# Patient Record
Sex: Male | Born: 1960 | Race: White | Hispanic: No | Marital: Married | Smoking: Current every day smoker
Health system: Southern US, Community
[De-identification: ages and names within clinical notes are randomized; demographics above are authoritative.]

## PROBLEM LIST (undated history)

## (undated) ENCOUNTER — Emergency Department (HOSPITAL_COMMUNITY): Admission: EM | Payer: Commercial Managed Care - PPO | Source: Home / Self Care

## (undated) DIAGNOSIS — M1990 Unspecified osteoarthritis, unspecified site: Secondary | ICD-10-CM

## (undated) DIAGNOSIS — E785 Hyperlipidemia, unspecified: Secondary | ICD-10-CM

## (undated) DIAGNOSIS — I2510 Atherosclerotic heart disease of native coronary artery without angina pectoris: Secondary | ICD-10-CM

## (undated) DIAGNOSIS — I779 Disorder of arteries and arterioles, unspecified: Secondary | ICD-10-CM

## (undated) DIAGNOSIS — Z8673 Personal history of transient ischemic attack (TIA), and cerebral infarction without residual deficits: Secondary | ICD-10-CM

## (undated) DIAGNOSIS — E079 Disorder of thyroid, unspecified: Secondary | ICD-10-CM

## (undated) DIAGNOSIS — I10 Essential (primary) hypertension: Secondary | ICD-10-CM

## (undated) HISTORY — DX: Disorder of thyroid, unspecified: E07.9

## (undated) HISTORY — DX: Hyperlipidemia, unspecified: E78.5

## (undated) HISTORY — DX: Unspecified osteoarthritis, unspecified site: M19.90

## (undated) HISTORY — PX: BACK SURGERY: SHX140

## (undated) HISTORY — DX: Disorder of arteries and arterioles, unspecified: I77.9

## (undated) HISTORY — DX: Essential (primary) hypertension: I10

---

## 2002-01-23 ENCOUNTER — Ambulatory Visit (HOSPITAL_COMMUNITY): Admission: RE | Admit: 2002-01-23 | Discharge: 2002-01-23 | Payer: Self-pay | Admitting: Neurosurgery

## 2002-01-23 ENCOUNTER — Encounter: Payer: Self-pay | Admitting: Neurosurgery

## 2005-08-31 ENCOUNTER — Ambulatory Visit: Payer: Self-pay | Admitting: Gastroenterology

## 2005-09-02 ENCOUNTER — Ambulatory Visit: Payer: Self-pay | Admitting: Gastroenterology

## 2005-09-27 ENCOUNTER — Other Ambulatory Visit: Payer: Self-pay

## 2005-09-28 ENCOUNTER — Ambulatory Visit: Payer: Self-pay | Admitting: Gastroenterology

## 2005-11-10 ENCOUNTER — Ambulatory Visit: Payer: Self-pay | Admitting: Gastroenterology

## 2007-12-15 IMAGING — RF DG BARIUM SWALLOW
1 series · 14 of 14 positions shown · non-contrast
Comparison: none

REASON FOR EXAM: Dysphagia
COMMENTS:

[Series 1: run · 8 acquisitions, 14 frames shown]
[im 1/8]
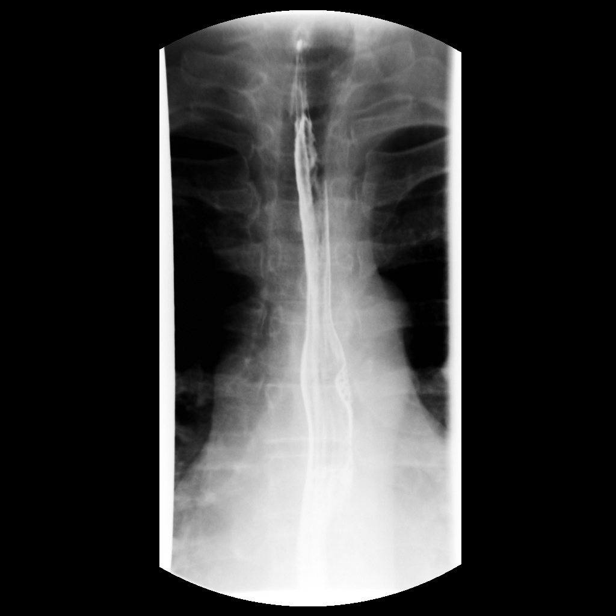
[im 2/8]
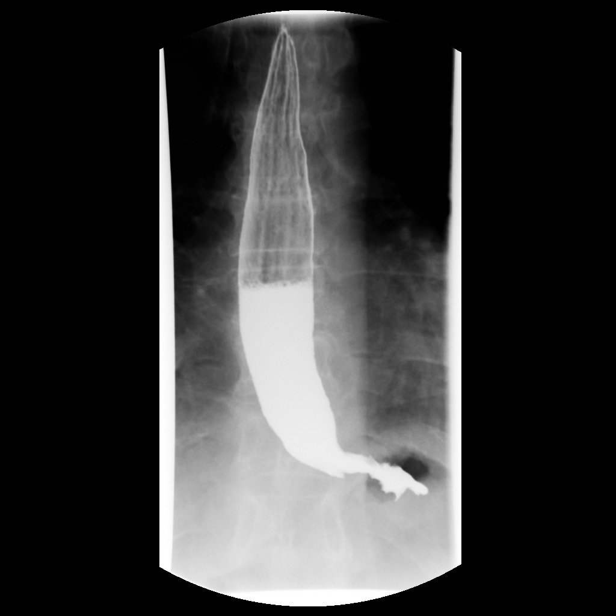
[im 3/8]
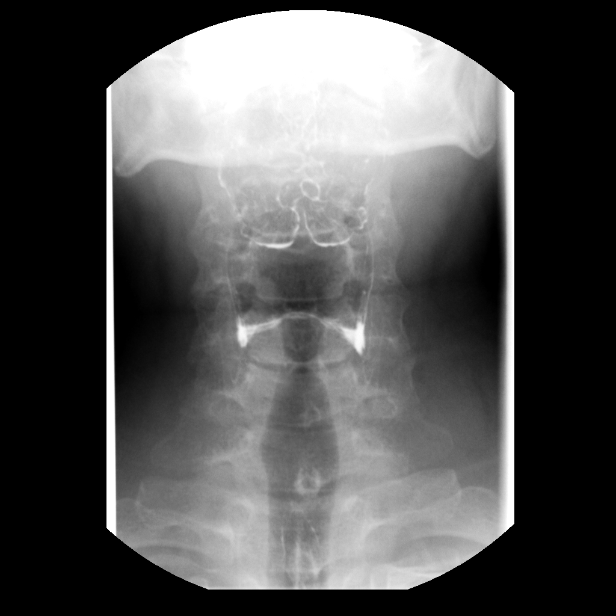
[im 3/8]
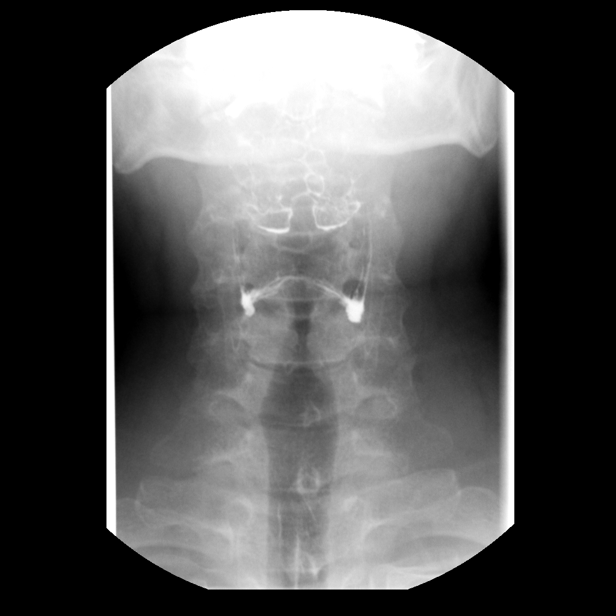
[im 3/8]
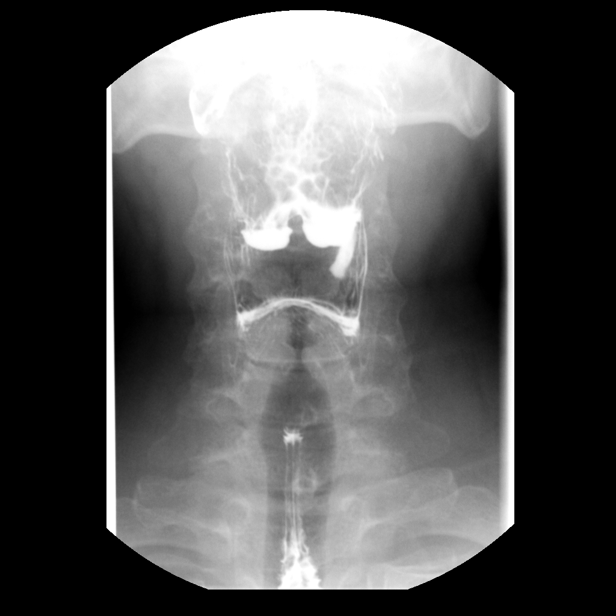
[im 3/8]
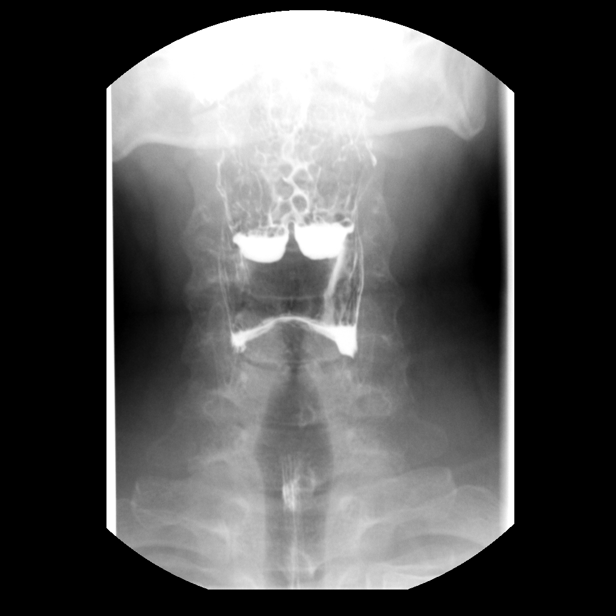
[im 4/8]
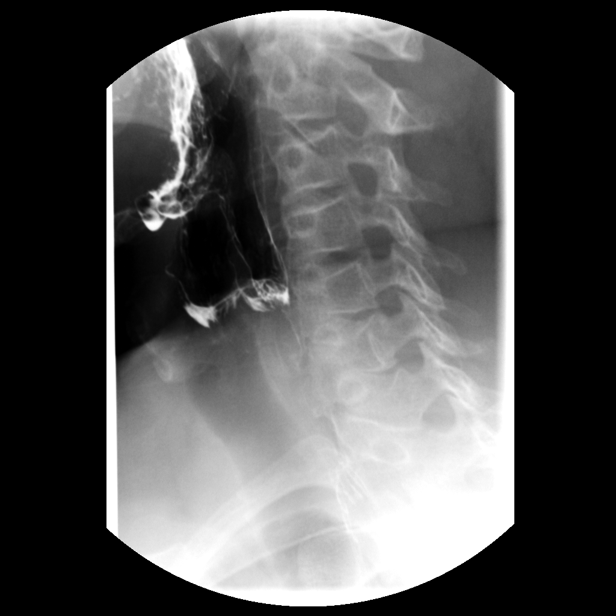
[im 4/8]
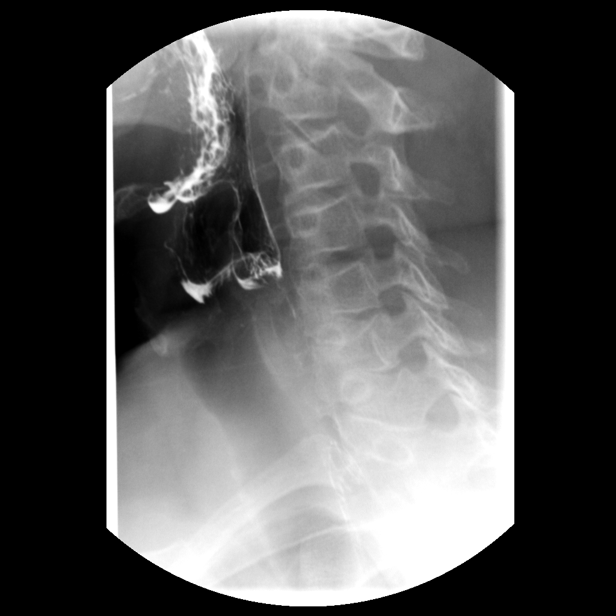
[im 4/8]
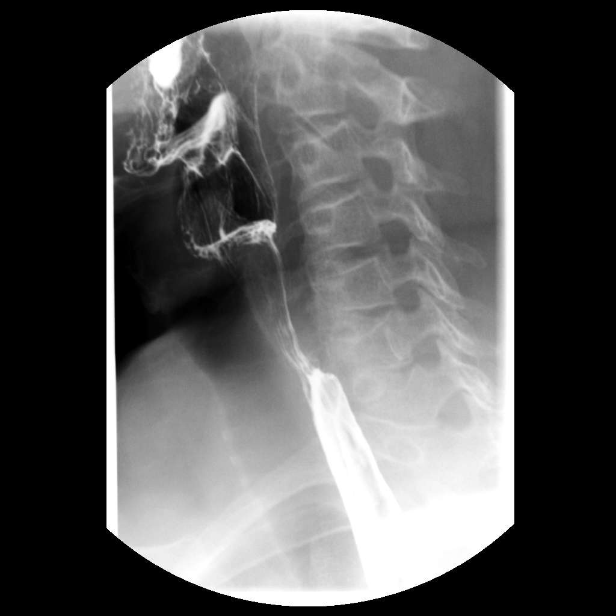
[im 4/8]
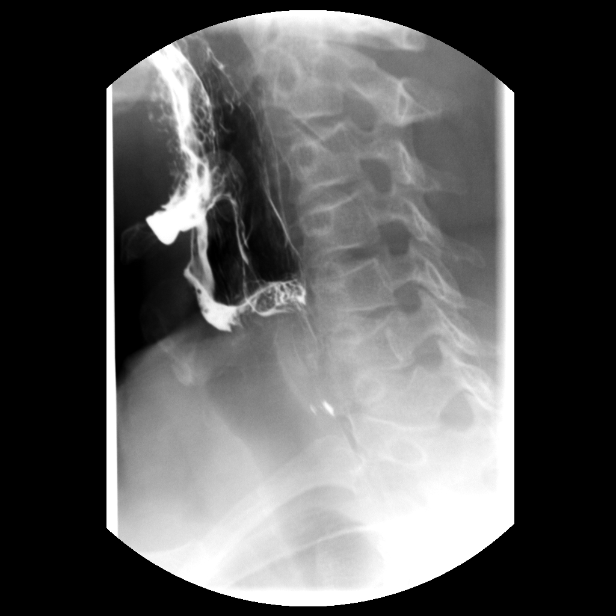
[im 5/8]
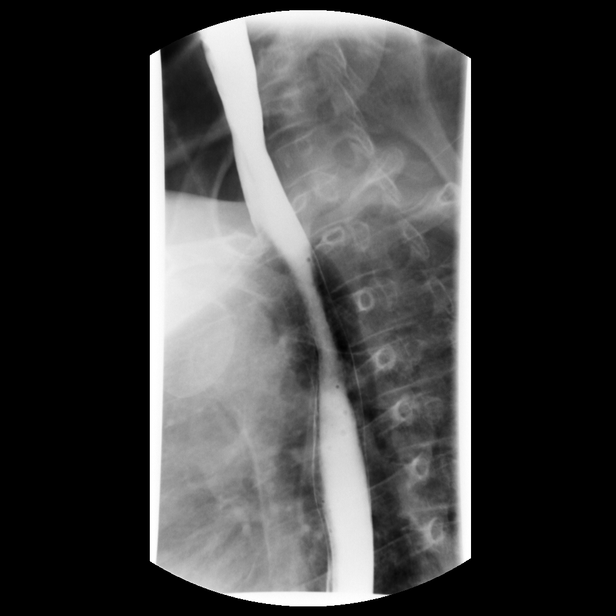
[im 6/8]
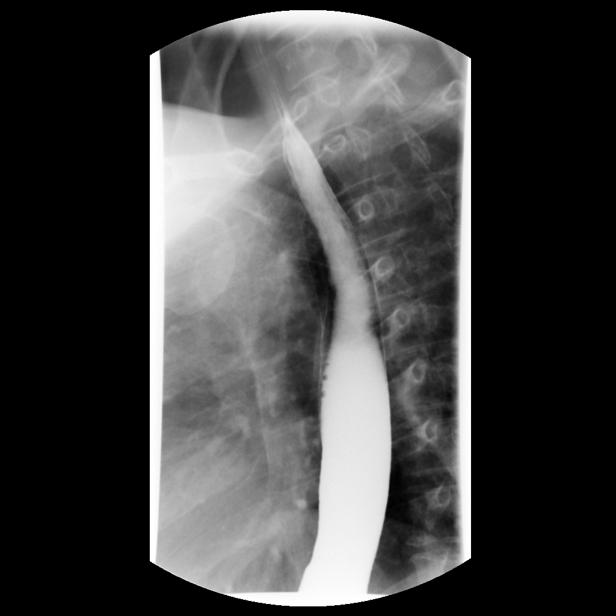
[im 7/8]
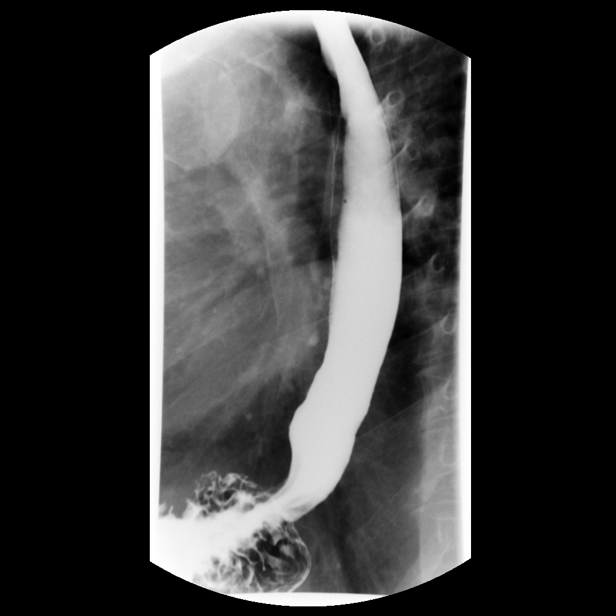
[im 8/8]
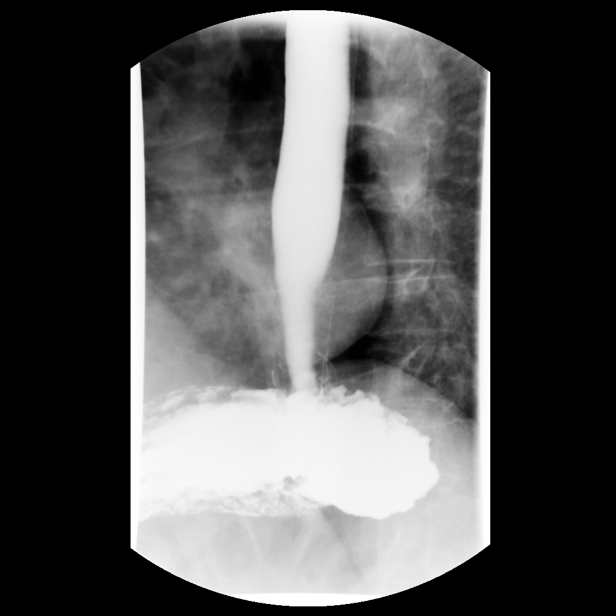

[14 of 14 positions shown; findings below may reference images not displayed]

PROCEDURE:     FL  - FL BARIUM SWALLOW  - August 31, 2005  [DATE]

RESULT:        The cervical esophagus was first examined. Barium passed
through the cervical esophagus unobstructed.  No areas of narrowing are
identified.  The remainder of the esophagus appears within normal limits
with normal peristaltic activity. No hiatal hernia is seen.  A moderate
amount of gastroesophageal reflux was noted post water siphon exam.  0.5 mm
tablet passed through the esophagus into the stomach unobstructed.
IMPRESSION: 1.     No hiatal hernia.
2.     Moderate amount of gastroesophageal reflux noted.

## 2012-12-27 ENCOUNTER — Ambulatory Visit: Payer: Self-pay | Admitting: Gastroenterology

## 2012-12-28 ENCOUNTER — Ambulatory Visit: Payer: Self-pay | Admitting: Gastroenterology

## 2013-01-02 ENCOUNTER — Ambulatory Visit: Payer: Self-pay | Admitting: Gastroenterology

## 2013-01-04 LAB — PATHOLOGY REPORT

## 2015-09-15 ENCOUNTER — Other Ambulatory Visit: Payer: Self-pay | Admitting: Emergency Medicine

## 2015-09-15 DIAGNOSIS — R634 Abnormal weight loss: Secondary | ICD-10-CM

## 2015-09-24 ENCOUNTER — Ambulatory Visit: Admission: RE | Admit: 2015-09-24 | Payer: BLUE CROSS/BLUE SHIELD | Source: Ambulatory Visit

## 2015-11-20 DIAGNOSIS — I10 Essential (primary) hypertension: Secondary | ICD-10-CM | POA: Insufficient documentation

## 2015-11-20 DIAGNOSIS — I6521 Occlusion and stenosis of right carotid artery: Secondary | ICD-10-CM | POA: Insufficient documentation

## 2015-11-24 ENCOUNTER — Other Ambulatory Visit: Payer: Self-pay | Admitting: Vascular Surgery

## 2015-11-24 DIAGNOSIS — I6523 Occlusion and stenosis of bilateral carotid arteries: Secondary | ICD-10-CM

## 2015-11-28 ENCOUNTER — Ambulatory Visit
Admission: RE | Admit: 2015-11-28 | Discharge: 2015-11-28 | Disposition: A | Discharge status: Home / Self Care | Payer: BLUE CROSS/BLUE SHIELD | Source: Ambulatory Visit | Attending: Emergency Medicine | Admitting: Emergency Medicine

## 2015-11-28 DIAGNOSIS — I700 Atherosclerosis of aorta: Secondary | ICD-10-CM | POA: Insufficient documentation

## 2015-11-28 DIAGNOSIS — I6523 Occlusion and stenosis of bilateral carotid arteries: Secondary | ICD-10-CM | POA: Diagnosis present

## 2015-11-28 DIAGNOSIS — R634 Abnormal weight loss: Secondary | ICD-10-CM | POA: Insufficient documentation

## 2015-11-28 DIAGNOSIS — I2510 Atherosclerotic heart disease of native coronary artery without angina pectoris: Secondary | ICD-10-CM | POA: Diagnosis not present

## 2015-11-28 MED ORDER — IOPAMIDOL (ISOVUE-370) INJECTION 76%
75.0000 mL | Freq: Once | INTRAVENOUS | Status: AC | PRN
  Administered 2015-11-28: 75 mL via INTRAVENOUS

## 2016-02-06 DIAGNOSIS — E782 Mixed hyperlipidemia: Secondary | ICD-10-CM | POA: Insufficient documentation

## 2016-05-28 DIAGNOSIS — I70219 Atherosclerosis of native arteries of extremities with intermittent claudication, unspecified extremity: Secondary | ICD-10-CM | POA: Insufficient documentation

## 2016-06-02 ENCOUNTER — Encounter (INDEPENDENT_AMBULATORY_CARE_PROVIDER_SITE_OTHER): Payer: BLUE CROSS/BLUE SHIELD

## 2016-06-02 ENCOUNTER — Ambulatory Visit (INDEPENDENT_AMBULATORY_CARE_PROVIDER_SITE_OTHER): Payer: Self-pay | Admitting: Vascular Surgery

## 2017-06-02 ENCOUNTER — Other Ambulatory Visit: Payer: Self-pay | Admitting: Student

## 2017-06-02 DIAGNOSIS — M5441 Lumbago with sciatica, right side: Principal | ICD-10-CM

## 2017-06-02 DIAGNOSIS — M5442 Lumbago with sciatica, left side: Principal | ICD-10-CM

## 2017-06-02 DIAGNOSIS — M4726 Other spondylosis with radiculopathy, lumbar region: Secondary | ICD-10-CM

## 2017-06-02 DIAGNOSIS — G8929 Other chronic pain: Secondary | ICD-10-CM

## 2017-06-08 ENCOUNTER — Ambulatory Visit
Admission: RE | Admit: 2017-06-08 | Discharge: 2017-06-08 | Disposition: A | Discharge status: Home / Self Care | Payer: BLUE CROSS/BLUE SHIELD | Source: Ambulatory Visit | Attending: Student | Admitting: Student

## 2017-06-08 DIAGNOSIS — M5442 Lumbago with sciatica, left side: Secondary | ICD-10-CM | POA: Diagnosis not present

## 2017-06-08 DIAGNOSIS — M5441 Lumbago with sciatica, right side: Secondary | ICD-10-CM | POA: Insufficient documentation

## 2017-06-08 DIAGNOSIS — M5126 Other intervertebral disc displacement, lumbar region: Secondary | ICD-10-CM | POA: Diagnosis not present

## 2017-06-08 DIAGNOSIS — M4726 Other spondylosis with radiculopathy, lumbar region: Secondary | ICD-10-CM | POA: Insufficient documentation

## 2017-06-08 DIAGNOSIS — G8929 Other chronic pain: Secondary | ICD-10-CM | POA: Insufficient documentation

## 2017-06-08 DIAGNOSIS — M48061 Spinal stenosis, lumbar region without neurogenic claudication: Secondary | ICD-10-CM | POA: Insufficient documentation

## 2017-06-08 DIAGNOSIS — M2578 Osteophyte, vertebrae: Secondary | ICD-10-CM | POA: Insufficient documentation

## 2020-07-31 ENCOUNTER — Other Ambulatory Visit: Payer: Self-pay | Admitting: Ophthalmology

## 2020-07-31 DIAGNOSIS — G453 Amaurosis fugax: Secondary | ICD-10-CM

## 2020-08-20 ENCOUNTER — Other Ambulatory Visit: Payer: Self-pay

## 2020-08-20 ENCOUNTER — Ambulatory Visit
Admission: RE | Admit: 2020-08-20 | Discharge: 2020-08-20 | Disposition: A | Discharge status: Home / Self Care | Payer: Commercial Managed Care - PPO | Source: Ambulatory Visit | Attending: Ophthalmology | Admitting: Ophthalmology

## 2020-08-20 DIAGNOSIS — G453 Amaurosis fugax: Secondary | ICD-10-CM | POA: Insufficient documentation

## 2020-09-01 ENCOUNTER — Encounter (INDEPENDENT_AMBULATORY_CARE_PROVIDER_SITE_OTHER): Payer: Self-pay | Admitting: Vascular Surgery

## 2020-09-01 ENCOUNTER — Other Ambulatory Visit: Payer: Self-pay

## 2020-09-01 ENCOUNTER — Ambulatory Visit (INDEPENDENT_AMBULATORY_CARE_PROVIDER_SITE_OTHER): Payer: Commercial Managed Care - PPO | Admitting: Vascular Surgery

## 2020-09-01 VITALS — BP 168/71 | HR 91 | Resp 16 | Ht 66.0 in | Wt 181.8 lb

## 2020-09-01 DIAGNOSIS — E782 Mixed hyperlipidemia: Secondary | ICD-10-CM | POA: Diagnosis not present

## 2020-09-01 DIAGNOSIS — I70219 Atherosclerosis of native arteries of extremities with intermittent claudication, unspecified extremity: Secondary | ICD-10-CM

## 2020-09-01 DIAGNOSIS — I10 Essential (primary) hypertension: Secondary | ICD-10-CM

## 2020-09-01 DIAGNOSIS — I6521 Occlusion and stenosis of right carotid artery: Secondary | ICD-10-CM | POA: Diagnosis not present

## 2020-09-01 NOTE — Progress Notes (Signed)
MRN : [id number]  [name] is a 60 y.o. (11-18-1960) male who presents with chief complaint of  Chief Complaint  Patient presents with  . New Patient (Initial Visit)    Ref Porfilio right carotid bifurcation contributes to 70-99% stenosis  .  History of Present Illness:   The patient is seen for evaluation of carotid stenosis. The carotid stenosis was identified after he experienced multiple episodes of right eye visual disturbances.  There is no recent history of TIA symptoms or focal motor deficits. There is no prior documented CVA.  There is no history of migraine headaches. There is no history of seizures.  The patient is taking enteric-coated aspirin 81 mg daily.  The patient has a history of coronary artery disease, no recent episodes of angina or shortness of breath. The patient has PAD noting his claudication symptoms are stable. There is a history of hyperlipidemia which is being treated with a statin.   Carotid duplex shows 80-99% RICA and <40% LICA  Current Meds  Medication Sig  . acetaminophen (TYLENOL) 500 MG tablet Take by mouth.  Marland Kitchen aspirin 81 MG EC tablet Take by mouth.  Marland Kitchen atorvastatin (LIPITOR) 40 MG tablet Take 1 tablet by mouth daily.  . famotidine (PEPCID) 20 MG tablet   . hydrochlorothiazide (HYDRODIURIL) 25 MG tablet   . levothyroxine (SYNTHROID) 150 MCG tablet   . lisinopril (ZESTRIL) 10 MG tablet     Past Medical History:  Diagnosis Date  . Arthritis   . Hyperlipidemia   . Hypertension   . Thyroid disease    hypothyrodism      Social History Social History   Tobacco Use  . Smoking status: Current Every Day Smoker  . Smokeless tobacco: Never Used  Substance Use Topics  . Alcohol use: Never  . Drug use: Never    Family History Family History  Problem Relation Age of Onset  . Hypothyroidism Mother   . Diabetes Mother   . Heart attack Mother   . Cancer Father   . Hypothyroidism Sister   . Hypothyroidism Brother   No  family history of bleeding/clotting disorders, porphyria or autoimmune disease   No Known Allergies   REVIEW OF SYSTEMS (Negative unless checked)  Constitutional: [] Weight loss  [] Fever  [] Chills Cardiac: [] Chest pain   [] Chest pressure   [] Palpitations   [] Shortness of breath when laying flat   [] Shortness of breath with exertion. Vascular:  [x] Pain in legs with walking   [] Pain in legs at rest  [] History of DVT   [] Phlebitis   [] Swelling in legs   [] Varicose veins   [] Non-healing ulcers Pulmonary:   [] Uses home oxygen   [] Productive cough   [] Hemoptysis   [] Wheeze  [] COPD   [] Asthma Neurologic:  [] Dizziness   [] Seizures   [] History of stroke   [] History of TIA  [] Aphasia   [x] Vissual changes   [] Weakness or numbness in arm   [] Weakness or numbness in leg Musculoskeletal:   [] Joint swelling   [x] Joint pain   [x] Low back pain Hematologic:  [] Easy bruising  [] Easy bleeding   [] Hypercoagulable state   [] Anemic Gastrointestinal:  [] Diarrhea   [] Vomiting  [] Gastroesophageal reflux/heartburn   [] Difficulty swallowing. Genitourinary:  [] Chronic kidney disease   [] Difficult urination  [] Frequent urination   [] Blood in urine Skin:  [] Rashes   [] Ulcers  Psychological:  [] History of anxiety   []  History of major depression.  Physical Examination  Vitals:   09/01/20 1028  BP: (!) 168/71  Pulse: 91  Resp: 16  Weight: 181 lb 12.8 oz (82.5 kg)  Height: 5\' 6"  (1.676 m)   Body mass index is 29.34 kg/m. Gen: WD/WN, NAD Head: [place]/AT, No temporalis wasting.  Ear/Nose/Throat: Hearing grossly intact, nares w/o erythema or drainage, poor dentition Eyes: PER, EOMI, sclera nonicteric.  Neck: Supple, no masses.  No bruit or JVD.  Pulmonary:  Good air movement, clear to auscultation bilaterally, no use of accessory muscles.  Cardiac: RRR, normal S1, S2, no Murmurs. Vascular: bilateral carotid bruits noted Vessel Right Left  Radial Palpable Palpable  Brachial Palpable Palpable  Carotid Palpable  Palpable  Gastrointestinal: soft, non-distended. No guarding/no peritoneal signs.  Musculoskeletal: M/S 5/5 throughout.  No deformity or atrophy.  Neurologic: CN 2-12 intact. Pain and light touch intact in extremities.  Symmetrical.  Speech is fluent. Motor exam as listed above. Psychiatric: Judgment intact, Mood & affect appropriate for pt's clinical situation. Dermatologic: No rashes or ulcers noted.  No changes consistent with cellulitis.   CBC No results found for: WBC, HGB, HCT, MCV, PLT  BMET No results found for: NA, K, CL, CO2, GLUCOSE, BUN, CREATININE, CALCIUM, GFRNONAA, GFRAA CrCl cannot be calculated (No successful lab value found.).  COAG No results found for: INR, PROTIME  Radiology Carotid Bilateral  Result Date: 08/20/2020 CLINICAL DATA:  60 year old male with a history of amaurosis fugax EXAM: BILATERAL CAROTID DUPLEX ULTRASOUND TECHNIQUE: 46 scale imaging, color Doppler and duplex ultrasound were performed of bilateral carotid and vertebral arteries in the neck. COMPARISON:  None. FINDINGS: Criteria: Quantification of carotid stenosis is based on velocity parameters that correlate the residual internal carotid diameter with NASCET-based stenosis levels, using the diameter of the distal internal carotid lumen as the denominator for stenosis measurement. The following velocity measurements were obtained: RIGHT ICA:  Systolic 484 cm/sec, Diastolic 105 cm/sec CCA:  33 cm/sec SYSTOLIC ICA/CCA RATIO:  14.5 ECA:  26 cm/sec LEFT ICA:  Systolic 142 cm/sec, Diastolic 54 cm/sec CCA:  107 cm/sec SYSTOLIC ICA/CCA RATIO:  1.3 ECA:  131 cm/sec Right Brachial SBP: Not acquired Left Brachial SBP: Not acquired RIGHT CAROTID ARTERY: No significant calcifications of the right common carotid artery. Intermediate waveform maintained. Moderate heterogeneous and partially calcified plaque at the right carotid bifurcation. No significant lumen shadowing. Low resistance waveform of the right ICA. No  significant tortuosity. RIGHT VERTEBRAL ARTERY: Antegrade flow with low resistance waveform. LEFT CAROTID ARTERY: No significant calcifications of the left common carotid artery. Intermediate waveform maintained. Moderate heterogeneous and partially calcified plaque at the left carotid bifurcation. No significant lumen shadowing. Low resistance waveform of the left ICA. No significant tortuosity. LEFT VERTEBRAL ARTERY:  Antegrade flow with low resistance waveform. IMPRESSION: Right: Heterogeneous and partially calcified plaque at the right carotid bifurcation contributes to 70%-99% stenosis by established duplex criteria. Left: Color duplex indicates moderate heterogeneous and calcified plaque, with no hemodynamically significant stenosis by duplex criteria in the extracranial cerebrovascular circulation. Signed, Wallace Cullens. Yvone Neu, RPVI [facility] Radiology Electronically Signed   By: [facility] D.O.   On: 08/20/2020 16:21     Assessment/Plan 1. Symptomatic carotid artery stenosis without infarction, right The patient remains asymptomatic with respect to the carotid stenosis.  However, the patient has now progressed and has a lesion the is >70%.  Patient should undergo CT angiography of the carotid arteries to define the degree of stenosis of the internal carotid arteries bilaterally and the anatomic suitability for surgery vs. intervention.  If the patient does indeed need  surgery cardiac clearance will be required, once cleared the patient will be scheduled for surgery.  The risks, benefits and alternative therapies were reviewed in detail with the patient.  All questions were answered.  The patient agrees to proceed with imaging.  Continue antiplatelet therapy as prescribed. Continue management of CAD, HTN and Hyperlipidemia. Healthy heart diet, encouraged exercise at least 4 times per week.   - CT ANGIO NECK W OR WO CONTRAST; Future  2.  Atherosclerotic peripheral vascular disease with intermittent claudication (HCC)  Recommend:  The patient has evidence of atherosclerosis of the lower extremities with claudication.  The patient does not voice lifestyle limiting changes at this point in time.  No invasive studies, angiography or surgery at this time The patient should continue walking and begin a more formal exercise program.  The patient should continue antiplatelet therapy and aggressive treatment of the lipid abnormalities  No changes in the patient's medications at this time  The patient should continue wearing graduated compression socks 10-15 mmHg strength to control the mild edema.    3. Benign essential HTN Continue antihypertensive medications as already ordered, these medications have been reviewed and there are no changes at this time.   4. Hyperlipidemia, mixed Continue statin as ordered and reviewed, no changes at this time   [name], MD  09/01/2020 11:04 AM

## 2020-09-04 ENCOUNTER — Telehealth (INDEPENDENT_AMBULATORY_CARE_PROVIDER_SITE_OTHER): Payer: Self-pay

## 2020-09-04 NOTE — Telephone Encounter (Signed)
[name] called and left a VM on the nurses line about scheduling a CT per DR. Schnier. I looked in the pt's char the order is there so I called Gevork Ayyad and gave her the number to radiology scheduling to schedule the pt's CT scan.

## 2020-09-17 ENCOUNTER — Ambulatory Visit
Admission: RE | Admit: 2020-09-17 | Discharge: 2020-09-17 | Disposition: A | Discharge status: Home / Self Care | Payer: Commercial Managed Care - PPO | Source: Ambulatory Visit | Attending: Vascular Surgery | Admitting: Vascular Surgery

## 2020-09-17 ENCOUNTER — Other Ambulatory Visit: Payer: Self-pay

## 2020-09-17 DIAGNOSIS — I6521 Occlusion and stenosis of right carotid artery: Secondary | ICD-10-CM

## 2020-09-17 LAB — POCT I-STAT CREATININE: Creatinine, Ser: 1.6 mg/dL — ABNORMAL HIGH (ref 0.61–1.24)

## 2020-09-17 MED ORDER — IOHEXOL 350 MG/ML SOLN
75.0000 mL | Freq: Once | INTRAVENOUS | Status: AC | PRN
  Administered 2020-09-17: 75 mL via INTRAVENOUS

## 2020-09-18 ENCOUNTER — Ambulatory Visit (INDEPENDENT_AMBULATORY_CARE_PROVIDER_SITE_OTHER): Payer: Commercial Managed Care - PPO | Admitting: Vascular Surgery

## 2020-09-18 VITALS — BP 131/70 | HR 77 | Ht 66.0 in | Wt 180.0 lb

## 2020-09-18 DIAGNOSIS — E782 Mixed hyperlipidemia: Secondary | ICD-10-CM

## 2020-09-18 DIAGNOSIS — I10 Essential (primary) hypertension: Secondary | ICD-10-CM | POA: Diagnosis not present

## 2020-09-18 DIAGNOSIS — I70219 Atherosclerosis of native arteries of extremities with intermittent claudication, unspecified extremity: Secondary | ICD-10-CM | POA: Diagnosis not present

## 2020-09-18 DIAGNOSIS — I6521 Occlusion and stenosis of right carotid artery: Secondary | ICD-10-CM | POA: Diagnosis not present

## 2020-09-19 ENCOUNTER — Telehealth (INDEPENDENT_AMBULATORY_CARE_PROVIDER_SITE_OTHER): Payer: Self-pay

## 2020-09-19 NOTE — Telephone Encounter (Signed)
Spoke with the patient and he is scheduled with Dr. Gilda Crease for a right carotid stent placement on 10/22/20. Covid testing on 10/20/20 between 8-12 pm at the [facility]. Pre-procedure instructions were discussed and will be mailed.

## 2020-09-21 ENCOUNTER — Encounter (INDEPENDENT_AMBULATORY_CARE_PROVIDER_SITE_OTHER): Payer: Self-pay | Admitting: Vascular Surgery

## 2020-09-21 NOTE — Progress Notes (Signed)
MRN : [id number]  [name] is a 60 y.o. ([birth year]) male who presents with chief complaint of  Chief Complaint  Patient presents with   Follow-up    CT results  .  History of Present Illness: The patient is seen for follow up evaluation of carotid stenosis status post CT angiogram. CT scan was done 09/18/2020. Patient reports that the test went well with no problems or complications.   The patient denies interval amaurosis fugax. There is no recent or interval TIA symptoms or focal motor deficits. There is no prior documented CVA.  The patient is taking enteric-coated aspirin 81 mg daily.  There is no history of migraine headaches. There is no history of seizures.  The patient has a history of coronary artery disease, no recent episodes of angina or shortness of breath. The patient denies PAD or claudication symptoms. There is a history of hyperlipidemia which is being treated with a statin.    CT angiogram is reviewed by me personally and shows 90 stenosis consistent with calcified plaque at the origin of the right internal carotid artery.    Current Meds  Medication Sig   acetaminophen (TYLENOL) 500 MG tablet Take by mouth.   aspirin 81 MG EC tablet Take by mouth.   atorvastatin (LIPITOR) 40 MG tablet Take 1 tablet by mouth daily.   famotidine (PEPCID) 20 MG tablet    hydrochlorothiazide (HYDRODIURIL) 25 MG tablet    levothyroxine (SYNTHROID) 150 MCG tablet    lisinopril (ZESTRIL) 10 MG tablet     Past Medical History:  Diagnosis Date   Arthritis    Hyperlipidemia    Hypertension    Thyroid disease    hypothyrodism    Past Surgical History:  Procedure Laterality Date   BACK SURGERY      Social History Social History   Tobacco Use   Smoking status: Every Day    Pack years: 0.00   Smokeless tobacco: Never  Substance Use Topics   Alcohol use: Never   Drug use: Never    Family History Family History  Problem Relation Age of Onset    Hypothyroidism Mother    Diabetes Mother    Heart attack Mother    Cancer Father    Hypothyroidism Sister    Hypothyroidism Brother     No Known Allergies   REVIEW OF SYSTEMS (Negative unless checked)  Constitutional: [] Weight loss  [] Fever  [] Chills Cardiac: [] Chest pain   [] Chest pressure   [] Palpitations   [] Shortness of breath when laying flat   [] Shortness of breath with exertion. Vascular:  [] Pain in legs with walking   [] Pain in legs at rest  [] History of DVT   [] Phlebitis   [] Swelling in legs   [] Varicose veins   [] Non-healing ulcers Pulmonary:   [] Uses home oxygen   [] Productive cough   [] Hemoptysis   [] Wheeze  [] COPD   [] Asthma Neurologic:  [] Dizziness   [] Seizures   [] History of stroke   [x] History of TIA  [] Aphasia   [x] Vissual changes   [] Weakness or numbness in arm   [] Weakness or numbness in leg Musculoskeletal:   [] Joint swelling   [] Joint pain   [] Low back pain Hematologic:  [] Easy bruising  [] Easy bleeding   [] Hypercoagulable state   [] Anemic Gastrointestinal:  [] Diarrhea   [] Vomiting  [] Gastroesophageal reflux/heartburn   [] Difficulty swallowing. Genitourinary:  [] Chronic kidney disease   [] Difficult urination  [] Frequent urination   [] Blood in urine Skin:  [] Rashes   [] Ulcers  Psychological:  []   History of anxiety   []  History of major depression.  Physical Examination  Vitals:   09/18/20 1544  BP: 131/70  Pulse: 77  Weight: 180 lb (81.6 kg)  Height: 5\' 6"  (1.676 m)   Body mass index is 29.05 kg/m. Gen: WD/WN, NAD Head: [place]/AT, No temporalis wasting.  Ear/Nose/Throat: Hearing grossly intact, nares w/o erythema or drainage Eyes: PER, EOMI, sclera nonicteric.  Neck: Supple, no large masses.   Pulmonary:  Good air movement, no audible wheezing bilaterally, no use of accessory muscles.  Cardiac: RRR, no JVD Vascular:   Vessel Right Left  Radial Palpable Palpable  Brachial Palpable Palpable  Carotid Palpable Palpable  Gastrointestinal: Non-distended. No  guarding/no peritoneal signs.  Musculoskeletal: M/S 5/5 throughout.  No deformity or atrophy.  Neurologic: CN 2-12 intact. Symmetrical.  Speech is fluent. Motor exam as listed above. Psychiatric: Judgment intact, Mood & affect appropriate for pt's clinical situation. Dermatologic: No rashes or ulcers noted.  No changes consistent with cellulitis.   CBC No results found for: WBC, HGB, HCT, MCV, PLT  BMET    Component Value Date/Time   CREATININE 1.60 (H) 09/17/2020 1306   Estimated Creatinine Clearance: 49.9 mL/min (A) (by C-G formula based on SCr of 1.6 mg/dL (H)).  COAG No results found for: INR, PROTIME  Radiology CT ANGIO NECK W OR WO CONTRAST  Result Date: 09/18/2020 CLINICAL DATA:  Carotid artery stenosis. EXAM: CT ANGIOGRAPHY NECK TECHNIQUE: Multidetector CT imaging of the neck was performed using the standard protocol during bolus administration of intravenous contrast. Multiplanar CT image reconstructions and MIPs were obtained to evaluate the vascular anatomy. Carotid stenosis measurements (when applicable) are obtained utilizing NASCET criteria, using the distal internal carotid diameter as the denominator. CONTRAST:  84mL OMNIPAQUE IOHEXOL 350 MG/ML SOLN COMPARISON:  CTA November 28, 2015. Ultrasound the carotids Aug 20, 2020. FINDINGS: Aortic arch: Calcific and noncalcific atherosclerosis of the aorta and its branch vessels. Approximately 70-80% narrowing of the left common carotid artery origin. Approximately 40% stenosis of the proximal left subclavian artery with irregular/ulcerated atherosclerosis. Right carotid system: Patent common carotid artery with mild multifocal narrowing due to predominantly noncalcific atherosclerosis. Mixed calcific and noncalcific atherosclerosis at the carotid bifurcation with approximately 70% stenosis. Tortuous right ICA. Left carotid system: Approximately 70-80% stenosis of the left common carotid artery origin. More distally there is mixed,  ulcerated atherosclerosis along the common carotid artery wall. At the carotid bifurcation there is mixed atherosclerosis without significant (greater than 50%) stenosis. Tortuous ICA with mild atherosclerotic narrowing at the skull base. Vertebral arteries: Left dominant. Approximately 80% right vertebral artery origin stenosis. Approximately 70% stenosis of the left vertebral artery origin. Otherwise, mild multifocal atherosclerotic narrowing of bilateral vertebral arteries. The right vertebral artery makes a small contribution to the basilar artery. Skeleton: Moderate degenerative disease at C6-C7 with disc height loss, endplate sclerosis and posterior osteophytes. Other neck: No evidence of acute abnormality on limited assessment. Upper chest: Mild, largely paraseptal emphysema. Visualized lung apices are clear. The IMPRESSION: 1. Mixed atherosclerosis at the right carotid bifurcation with approximately 70% stenosis. 2. Approximately 70-80% stenosis of the left common carotid artery origin. 3. Approximately 80% right and 70% left vertebral artery origin stenosis. 4. Approximately 40% stenosis of the proximal left subclavian artery. 5. Mild emphysema in the visualized lung apices. Electronically Signed   By: November 30, 2015 MD   On: 09/18/2020 07:58     Assessment/Plan 1. Symptomatic carotid artery stenosis without infarction, right Recommend:  The patient is symptomatic with  respect to the carotid stenosis.  The patient now has progressed and has a lesion the is >70%.  Patient's CT angiography of the carotid arteries confirms >70% right ICA stenosis.  The anatomical considerations support stenting over surgery.  This was discussed in detail with the patient.  The risks, benefits and alternative therapies were reviewed in detail with the patient.  All questions were answered.  The patient agrees to proceed with stenting of the right carotid artery.  Continue antiplatelet therapy as  prescribed. Continue management of CAD, HTN and Hyperlipidemia. Healthy heart diet, encouraged exercise at least 4 times per week.    2. Atherosclerotic peripheral vascular disease with intermittent claudication (HCC)  Recommend:  The patient has evidence of atherosclerosis of the lower extremities with claudication.  The patient does not voice lifestyle limiting changes at this point in time.  Noninvasive studies do not suggest clinically significant change.  No invasive studies, angiography or surgery at this time The patient should continue walking and begin a more formal exercise program.  The patient should continue antiplatelet therapy and aggressive treatment of the lipid abnormalities  No changes in the patient's medications at this time  The patient should continue wearing graduated compression socks 10-15 mmHg strength to control the mild edema.    3. Benign essential HTN Continue antihypertensive medications as already ordered, these medications have been reviewed and there are no changes at this time.   4. Hyperlipidemia, mixed Continue statin as ordered and reviewed, no changes at this time      [name], MD  09/21/2020 11:26 AM

## 2020-09-22 ENCOUNTER — Telehealth (INDEPENDENT_AMBULATORY_CARE_PROVIDER_SITE_OTHER): Payer: Self-pay

## 2020-09-22 NOTE — Telephone Encounter (Signed)
The pt's Spouse Lurena Joiner called and wanted to make [place] aware that per her husbands last visit to see Dr. Gilda Crease on 09/18/20, he was to have an Rx for Plavix sent into his pharmacy. I spoke to Dr, Gilda Crease an he made me aware that he is going to send in the RX to the pt's preferred pharmacy . I called the pt's wife and  confirmed the pharmacy,and made her aware that the provider is going to send over the Rx.

## 2020-09-23 ENCOUNTER — Telehealth (INDEPENDENT_AMBULATORY_CARE_PROVIDER_SITE_OTHER): Payer: Self-pay

## 2020-09-23 NOTE — Telephone Encounter (Signed)
Patient wife left a voicemail checking the prescription Clopidogrel. Patient wife was made aware that prescription for Clopidogrel 75 mg will be called into pharmacy today. I left a message on pharmacy voicemail for Clopidogrel 75mg  with 5 refills

## 2020-10-20 ENCOUNTER — Other Ambulatory Visit
Admission: RE | Admit: 2020-10-20 | Discharge: 2020-10-20 | Disposition: A | Discharge status: Home / Self Care | Payer: Commercial Managed Care - PPO | Source: Ambulatory Visit | Attending: Vascular Surgery | Admitting: Vascular Surgery

## 2020-10-20 ENCOUNTER — Other Ambulatory Visit: Payer: Self-pay

## 2020-10-20 DIAGNOSIS — Z01812 Encounter for preprocedural laboratory examination: Secondary | ICD-10-CM | POA: Insufficient documentation

## 2020-10-20 DIAGNOSIS — Z20822 Contact with and (suspected) exposure to covid-19: Secondary | ICD-10-CM | POA: Insufficient documentation

## 2020-10-21 ENCOUNTER — Other Ambulatory Visit (INDEPENDENT_AMBULATORY_CARE_PROVIDER_SITE_OTHER): Payer: Self-pay | Admitting: Nurse Practitioner

## 2020-10-21 LAB — SARS CORONAVIRUS 2 (TAT 6-24 HRS): SARS Coronavirus 2: NEGATIVE

## 2020-10-22 ENCOUNTER — Encounter: Payer: Self-pay | Admitting: Vascular Surgery

## 2020-10-22 ENCOUNTER — Encounter
Admission: RE | Disposition: A | Discharge status: Home / Self Care | Payer: Self-pay | Source: Home / Self Care | Attending: Vascular Surgery

## 2020-10-22 ENCOUNTER — Inpatient Hospital Stay
Admission: RE | Admit: 2020-10-22 | Discharge: 2020-10-23 | DRG: 036 | Disposition: A | Discharge status: Home / Self Care | Payer: Commercial Managed Care - PPO | Attending: Vascular Surgery | Admitting: Vascular Surgery

## 2020-10-22 ENCOUNTER — Other Ambulatory Visit: Payer: Self-pay

## 2020-10-22 DIAGNOSIS — Z833 Family history of diabetes mellitus: Secondary | ICD-10-CM | POA: Diagnosis not present

## 2020-10-22 DIAGNOSIS — G453 Amaurosis fugax: Secondary | ICD-10-CM

## 2020-10-22 DIAGNOSIS — I10 Essential (primary) hypertension: Secondary | ICD-10-CM | POA: Diagnosis present

## 2020-10-22 DIAGNOSIS — Z8249 Family history of ischemic heart disease and other diseases of the circulatory system: Secondary | ICD-10-CM

## 2020-10-22 DIAGNOSIS — E782 Mixed hyperlipidemia: Secondary | ICD-10-CM | POA: Diagnosis present

## 2020-10-22 DIAGNOSIS — I70219 Atherosclerosis of native arteries of extremities with intermittent claudication, unspecified extremity: Secondary | ICD-10-CM | POA: Diagnosis present

## 2020-10-22 DIAGNOSIS — Z79899 Other long term (current) drug therapy: Secondary | ICD-10-CM | POA: Diagnosis not present

## 2020-10-22 DIAGNOSIS — F17200 Nicotine dependence, unspecified, uncomplicated: Secondary | ICD-10-CM | POA: Diagnosis present

## 2020-10-22 DIAGNOSIS — E039 Hypothyroidism, unspecified: Secondary | ICD-10-CM | POA: Diagnosis present

## 2020-10-22 DIAGNOSIS — Z7902 Long term (current) use of antithrombotics/antiplatelets: Secondary | ICD-10-CM | POA: Diagnosis not present

## 2020-10-22 DIAGNOSIS — Z20822 Contact with and (suspected) exposure to covid-19: Secondary | ICD-10-CM | POA: Diagnosis present

## 2020-10-22 DIAGNOSIS — I6521 Occlusion and stenosis of right carotid artery: Secondary | ICD-10-CM

## 2020-10-22 DIAGNOSIS — I70213 Atherosclerosis of native arteries of extremities with intermittent claudication, bilateral legs: Secondary | ICD-10-CM | POA: Diagnosis present

## 2020-10-22 DIAGNOSIS — Z79890 Hormone replacement therapy (postmenopausal): Secondary | ICD-10-CM | POA: Diagnosis not present

## 2020-10-22 DIAGNOSIS — I6523 Occlusion and stenosis of bilateral carotid arteries: Secondary | ICD-10-CM | POA: Diagnosis present

## 2020-10-22 DIAGNOSIS — M1990 Unspecified osteoarthritis, unspecified site: Secondary | ICD-10-CM | POA: Diagnosis present

## 2020-10-22 DIAGNOSIS — Z7982 Long term (current) use of aspirin: Secondary | ICD-10-CM | POA: Diagnosis not present

## 2020-10-22 HISTORY — PX: CAROTID PTA/STENT INTERVENTION: CATH118231

## 2020-10-22 LAB — GLUCOSE, CAPILLARY: Glucose-Capillary: 89 mg/dL (ref 70–99)

## 2020-10-22 LAB — CREATININE, SERUM
Creatinine, Ser: 1.57 mg/dL — ABNORMAL HIGH (ref 0.61–1.24)
GFR, Estimated: 50 mL/min — ABNORMAL LOW (ref >60)

## 2020-10-22 LAB — MRSA NEXT GEN BY PCR, NASAL: MRSA by PCR Next Gen: NOT DETECTED

## 2020-10-22 LAB — POCT ACTIVATED CLOTTING TIME: Activated Clotting Time: 335 seconds

## 2020-10-22 LAB — BUN: BUN: 30 mg/dL — ABNORMAL HIGH (ref 6–20)

## 2020-10-22 SURGERY — CAROTID PTA/STENT INTERVENTION
Anesthesia: Moderate Sedation | Laterality: Right

## 2020-10-22 MED ORDER — DIPHENHYDRAMINE HCL 50 MG/ML IJ SOLN: 50.0000 mg | Freq: Once | INTRAMUSCULAR | Status: DC | PRN

## 2020-10-22 MED ORDER — LABETALOL HCL 5 MG/ML IV SOLN: 10.0000 mg | INTRAVENOUS | Status: DC | PRN

## 2020-10-22 MED ORDER — DOCUSATE SODIUM 100 MG PO CAPS
100.0000 mg | ORAL_CAPSULE | Freq: Every day | ORAL | Status: DC
  Administered 2020-10-22: 100 mg via ORAL
  Filled 2020-10-22: qty 1

## 2020-10-22 MED ORDER — LISINOPRIL 10 MG PO TABS
10.0000 mg | ORAL_TABLET | Freq: Every day | ORAL | Status: DC
  Administered 2020-10-22: 10 mg via ORAL
  Filled 2020-10-22: qty 1
  Filled 2020-10-22: qty 2
  Filled 2020-10-22: qty 1

## 2020-10-22 MED ORDER — HYDROCHLOROTHIAZIDE 25 MG PO TABS
25.0000 mg | ORAL_TABLET | Freq: Every day | ORAL | Status: DC
  Administered 2020-10-22: 25 mg via ORAL
  Filled 2020-10-22: qty 1

## 2020-10-22 MED ORDER — OXYCODONE HCL 5 MG PO TABS: 5.0000 mg | ORAL_TABLET | ORAL | Status: DC | PRN

## 2020-10-22 MED ORDER — ONDANSETRON HCL 4 MG/2ML IJ SOLN: 4.0000 mg | Freq: Four times a day (QID) | INTRAMUSCULAR | Status: DC | PRN

## 2020-10-22 MED ORDER — MIDAZOLAM HCL 2 MG/ML PO SYRP: 8.0000 mg | ORAL_SOLUTION | Freq: Once | ORAL | Status: DC | PRN

## 2020-10-22 MED ORDER — MIDAZOLAM HCL 5 MG/5ML IJ SOLN
INTRAMUSCULAR | Status: AC
  Filled 2020-10-22: qty 5

## 2020-10-22 MED ORDER — HEPARIN SODIUM (PORCINE) 1000 UNIT/ML IJ SOLN
INTRAMUSCULAR | Status: DC | PRN
  Administered 2020-10-22: 8000 [IU] via INTRAVENOUS

## 2020-10-22 MED ORDER — FENTANYL CITRATE (PF) 100 MCG/2ML IJ SOLN
INTRAMUSCULAR | Status: DC | PRN
  Administered 2020-10-22: 50 ug via INTRAVENOUS

## 2020-10-22 MED ORDER — ACETAMINOPHEN 325 MG RE SUPP
325.0000 mg | RECTAL | Status: DC | PRN
Start: 2020-10-22 — End: 2020-10-23
  Filled 2020-10-22: qty 2

## 2020-10-22 MED ORDER — FENTANYL CITRATE (PF) 100 MCG/2ML IJ SOLN
INTRAMUSCULAR | Status: AC
  Filled 2020-10-22: qty 2

## 2020-10-22 MED ORDER — HYDRALAZINE HCL 20 MG/ML IJ SOLN: 5.0000 mg | INTRAMUSCULAR | Status: DC | PRN

## 2020-10-22 MED ORDER — PHENOL 1.4 % MT LIQD
1.0000 | OROMUCOSAL | Status: DC | PRN
  Filled 2020-10-22: qty 177

## 2020-10-22 MED ORDER — CLOPIDOGREL BISULFATE 75 MG PO TABS
75.0000 mg | ORAL_TABLET | Freq: Every day | ORAL | Status: DC
  Administered 2020-10-22: 75 mg via ORAL
  Filled 2020-10-22: qty 1

## 2020-10-22 MED ORDER — SODIUM CHLORIDE 0.9 % IV SOLN: INTRAVENOUS | Status: DC

## 2020-10-22 MED ORDER — ALUM & MAG HYDROXIDE-SIMETH 200-200-20 MG/5ML PO SUSP: 15.0000 mL | ORAL | Status: DC | PRN

## 2020-10-22 MED ORDER — GUAIFENESIN-DM 100-10 MG/5ML PO SYRP
15.0000 mL | ORAL_SOLUTION | ORAL | Status: DC | PRN
  Filled 2020-10-22: qty 15

## 2020-10-22 MED ORDER — HEPARIN SODIUM (PORCINE) 1000 UNIT/ML IJ SOLN
INTRAMUSCULAR | Status: AC
  Filled 2020-10-22: qty 1

## 2020-10-22 MED ORDER — IODIXANOL 320 MG/ML IV SOLN
INTRAVENOUS | Status: DC | PRN
  Administered 2020-10-22: 50 mL

## 2020-10-22 MED ORDER — ONDANSETRON HCL 4 MG/2ML IJ SOLN
4.0000 mg | Freq: Four times a day (QID) | INTRAMUSCULAR | Status: DC | PRN
  Administered 2020-10-23: 4 mg via INTRAVENOUS
  Filled 2020-10-22: qty 2

## 2020-10-22 MED ORDER — FAMOTIDINE 20 MG PO TABS
20.0000 mg | ORAL_TABLET | Freq: Every day | ORAL | Status: DC
  Administered 2020-10-22: 20 mg via ORAL
  Filled 2020-10-22: qty 1

## 2020-10-22 MED ORDER — MIDAZOLAM HCL 2 MG/2ML IJ SOLN
INTRAMUSCULAR | Status: DC | PRN
  Administered 2020-10-22: 2 mg via INTRAVENOUS

## 2020-10-22 MED ORDER — SODIUM CHLORIDE 0.9 % IV SOLN: 500.0000 mL | Freq: Once | INTRAVENOUS | Status: DC | PRN

## 2020-10-22 MED ORDER — CEFAZOLIN SODIUM-DEXTROSE 2-4 GM/100ML-% IV SOLN: 2.0000 g | Freq: Once | INTRAVENOUS | Status: AC

## 2020-10-22 MED ORDER — CEFAZOLIN SODIUM-DEXTROSE 2-4 GM/100ML-% IV SOLN
INTRAVENOUS | Status: AC
  Administered 2020-10-22: 2 g via INTRAVENOUS
  Filled 2020-10-22: qty 100

## 2020-10-22 MED ORDER — PANTOPRAZOLE SODIUM 40 MG IV SOLR
40.0000 mg | Freq: Every day | INTRAVENOUS | Status: DC
  Administered 2020-10-22: 40 mg via INTRAVENOUS
  Filled 2020-10-22: qty 40

## 2020-10-22 MED ORDER — PHENYLEPHRINE HCL (PRESSORS) 10 MG/ML IV SOLN
INTRAVENOUS | Status: AC
  Filled 2020-10-22: qty 1

## 2020-10-22 MED ORDER — ATROPINE SULFATE 1 MG/10ML IJ SOSY
PREFILLED_SYRINGE | INTRAMUSCULAR | Status: AC
  Filled 2020-10-22: qty 10

## 2020-10-22 MED ORDER — METHYLPREDNISOLONE SODIUM SUCC 125 MG IJ SOLR: 125.0000 mg | Freq: Once | INTRAMUSCULAR | Status: DC | PRN

## 2020-10-22 MED ORDER — LEVOTHYROXINE SODIUM 150 MCG PO TABS
150.0000 ug | ORAL_TABLET | Freq: Every day | ORAL | Status: DC
  Administered 2020-10-23: 150 ug via ORAL
  Filled 2020-10-22: qty 3
  Filled 2020-10-22: qty 1

## 2020-10-22 MED ORDER — FAMOTIDINE 20 MG PO TABS: 40.0000 mg | ORAL_TABLET | Freq: Once | ORAL | Status: DC | PRN

## 2020-10-22 MED ORDER — ASPIRIN 81 MG PO TBEC: 81.0000 mg | DELAYED_RELEASE_TABLET | Freq: Every day | ORAL | Status: DC

## 2020-10-22 MED ORDER — KCL IN DEXTROSE-NACL 20-5-0.9 MEQ/L-%-% IV SOLN
INTRAVENOUS | Status: AC
  Filled 2020-10-22 (×2): qty 1000

## 2020-10-22 MED ORDER — METOPROLOL TARTRATE 5 MG/5ML IV SOLN: 2.0000 mg | INTRAVENOUS | Status: DC | PRN

## 2020-10-22 MED ORDER — HYDROMORPHONE HCL 1 MG/ML IJ SOLN: 1.0000 mg | Freq: Once | INTRAMUSCULAR | Status: DC | PRN

## 2020-10-22 MED ORDER — ATROPINE SULFATE 1 MG/10ML IJ SOSY
PREFILLED_SYRINGE | INTRAMUSCULAR | Status: DC | PRN
  Administered 2020-10-22: 1 mg via INTRAVENOUS

## 2020-10-22 MED ORDER — ASPIRIN EC 81 MG PO TBEC
81.0000 mg | DELAYED_RELEASE_TABLET | Freq: Every day | ORAL | Status: DC
  Administered 2020-10-22: 81 mg via ORAL
  Filled 2020-10-22: qty 1

## 2020-10-22 MED ORDER — CEFAZOLIN SODIUM-DEXTROSE 2-4 GM/100ML-% IV SOLN
2.0000 g | Freq: Three times a day (TID) | INTRAVENOUS | Status: AC
  Administered 2020-10-22 – 2020-10-23 (×2): 2 g via INTRAVENOUS
  Filled 2020-10-22 (×2): qty 100

## 2020-10-22 MED ORDER — MORPHINE SULFATE (PF) 4 MG/ML IV SOLN: 2.0000 mg | INTRAVENOUS | Status: DC | PRN

## 2020-10-22 MED ORDER — ATORVASTATIN CALCIUM 20 MG PO TABS
40.0000 mg | ORAL_TABLET | Freq: Every day | ORAL | Status: DC
  Administered 2020-10-22: 40 mg via ORAL
  Filled 2020-10-22: qty 2

## 2020-10-22 MED ORDER — ACETAMINOPHEN 325 MG PO TABS: 325.0000 mg | ORAL_TABLET | ORAL | Status: DC | PRN

## 2020-10-22 SURGICAL SUPPLY — 20 items
BALLN VIATRAC 5X15X135 (BALLOONS) ×2
BALLOON VIATRAC 5X15X135 (BALLOONS) ×1 IMPLANT
CATH ANGIO 5F PIGTAIL 100CM (CATHETERS) ×2 IMPLANT
CATH BEACON 5 .035 100 H1 TIP (CATHETERS) ×2 IMPLANT
COVER DRAPE FLUORO 36X44 (DRAPES) ×2 IMPLANT
COVER PROBE U/S 5X48 (MISCELLANEOUS) ×2 IMPLANT
DEVICE EMBOSHIELD NAV6 4.0-7.0 (FILTER) ×2 IMPLANT
DEVICE STARCLOSE SE CLOSURE (Vascular Products) ×2 IMPLANT
DEVICE TORQUE (MISCELLANEOUS) ×2 IMPLANT
GLIDEWIRE ANGLED SS 035X260CM (WIRE) ×2 IMPLANT
GUIDEWIRE VASC STIFF .038X260 (WIRE) ×2 IMPLANT
KIT ENCORE 26 ADVANTAGE (KITS) ×2 IMPLANT
NEEDLE ENTRY 21GA 7CM ECHOTIP (NEEDLE) ×2 IMPLANT
PACK ANGIOGRAPHY (CUSTOM PROCEDURE TRAY) ×2 IMPLANT
SHEATH BRITE TIP 5FRX11 (SHEATH) ×2 IMPLANT
SHEATH GUIDING CAROTID 6FRX90 (SHEATH) ×2 IMPLANT
STENT XACT CAR 10-8X30X136 (Permanent Stent) ×2 IMPLANT
SYR MEDRAD MARK 7 150ML (SYRINGE) ×2 IMPLANT
TUBING CONTRAST HIGH PRESS 72 (TUBING) ×4 IMPLANT
WIRE GUIDERIGHT .035X150 (WIRE) ×2 IMPLANT

## 2020-10-22 NOTE — H&P (Signed)
@LOGO @   MRN :  [name] is a 60 y.o. (Mar 10, 1961) male who presents with chief complaint of No chief complaint on file. 06/26/1960  History of Present Illness:  Patient presents to [facility] today for treatment of his critical carotid stenosis.  The patient is seen for follow up evaluation of carotid stenosis status post CT angiogram. CT scan was done 09/18/2020. Patient reports that the test went well with no problems or complications.   The patient denies interval amaurosis fugax. There is no recent or interval TIA symptoms or focal motor deficits. There is no prior documented CVA.   The patient is taking enteric-coated aspirin 81 mg daily.   There is no history of migraine headaches. There is no history of seizures.   The patient has a history of coronary artery disease, no recent episodes of angina or shortness of breath. The patient denies PAD or claudication symptoms. There is a history of hyperlipidemia which is being treated with a statin.     CT angiogram is reviewed by me personally and shows 90 stenosis consistent with calcified plaque at the origin of the right internal carotid artery.      Current Meds  Medication Sig   aspirin 81 MG EC tablet Take 81 mg by mouth daily.   atorvastatin (LIPITOR) 40 MG tablet Take 40 mg by mouth at bedtime.   clopidogrel (PLAVIX) 75 MG tablet Take 75 mg by mouth daily.   famotidine (PEPCID) 20 MG tablet Take 20 mg by mouth at bedtime.   hydrochlorothiazide (HYDRODIURIL) 25 MG tablet Take 25 mg by mouth at bedtime.   levothyroxine (SYNTHROID) 150 MCG tablet Take 150 mcg by mouth at bedtime.   lisinopril (ZESTRIL) 10 MG tablet Take 10 mg by mouth at bedtime.    Past Medical History:  Diagnosis Date   Arthritis    Hyperlipidemia    Hypertension    Thyroid disease    hypothyrodism    Past Surgical History:  Procedure Laterality Date   BACK SURGERY      Social History Social History   Tobacco  Use   Smoking status: Every Day    Pack years: 0.00   Smokeless tobacco: Never  Substance Use Topics   Alcohol use: Never   Drug use: Never    Family History Family History  Problem Relation Age of Onset   Hypothyroidism Mother    Diabetes Mother    Heart attack Mother    Cancer Father    Hypothyroidism Sister    Hypothyroidism Brother     No Known Allergies   REVIEW OF SYSTEMS (Negative unless checked)  Constitutional: [] Weight loss  [] Fever  [] Chills Cardiac: [] Chest pain   [] Chest pressure   [] Palpitations   [] Shortness of breath when laying flat   [] Shortness of breath with exertion. Vascular:  [] Pain in legs with walking   [] Pain in legs at rest  [] History of DVT   [] Phlebitis   [] Swelling in legs   [] Varicose veins   [] Non-healing ulcers Pulmonary:   [] Uses home oxygen   [] Productive cough   [] Hemoptysis   [] Wheeze  [] COPD   [] Asthma Neurologic:  [x] Dizziness   [] Seizures   [] History of stroke   [] History of TIA  [] Aphasia   [x] Vissual changes   [] Weakness or numbness in arm   [] Weakness or numbness in leg Musculoskeletal:   [] Joint swelling   [x] Joint pain   [] Low back pain Hematologic:  [] Easy bruising  [] Easy bleeding   []   Hypercoagulable state   [] Anemic Gastrointestinal:  [] Diarrhea   [] Vomiting  [] Gastroesophageal reflux/heartburn   [] Difficulty swallowing. Genitourinary:  [] Chronic kidney disease   [] Difficult urination  [] Frequent urination   [] Blood in urine Skin:  [] Rashes   [] Ulcers  Psychological:  [] History of anxiety   []  History of major depression.  Physical Examination  Vitals:   10/22/20 1047  BP: (!) 154/71  Pulse: 79  Resp: 15  Temp: 98.1 F (36.7 C)  TempSrc: Oral  SpO2: 97%  Weight: 80.3 kg  Height: 5\' 6"  (1.676 m)   Body mass index is 28.57 kg/m. Gen: WD/WN, NAD Head: [place]/AT, No temporalis wasting.  Ear/Nose/Throat: Hearing grossly intact, nares w/o erythema or drainage Eyes: PER, EOMI, sclera nonicteric.  Neck: Supple, no large  masses.   Pulmonary:  Good air movement, no audible wheezing bilaterally, no use of accessory muscles.  Cardiac: RRR, no JVD Vascular:   Right carotid bruit present Vessel Right Left  Radial Palpable Palpable  Brachial Palpable Palpable  Carotid Palpable Palpable  Gastrointestinal: Non-distended. No guarding/no peritoneal signs.  Musculoskeletal: M/S 5/5 throughout.  No deformity or atrophy.  Neurologic: CN 2-12 intact. Symmetrical.  Speech is fluent. Motor exam as listed above. Psychiatric: Judgment intact, Mood & affect appropriate for pt's clinical situation. Dermatologic: No rashes or ulcers noted.  No changes consistent with cellulitis. Lymph : No lichenification or skin changes of chronic lymphedema.  CBC No results found for: WBC, HGB, HCT, MCV, PLT  BMET    Component Value Date/Time   CREATININE 1.60 (H) 09/17/2020 1306   CrCl cannot be calculated (Patient's most recent lab result is older than the maximum 21 days allowed.).  COAG No results found for: INR, PROTIME  Radiology No results found.   Assessment/Plan 1. Symptomatic carotid artery stenosis without infarction, right Recommend:   The patient is symptomatic with respect to the carotid stenosis.  The patient now has progressed and has a lesion the is >70%.   Patient's CT angiography of the carotid arteries confirms >70% right ICA stenosis.  The anatomical considerations support stenting over surgery.  This was discussed in detail with the patient.   The risks, benefits and alternative therapies were reviewed in detail with the patient.  All questions were answered.  The patient agrees to proceed with stenting of the right carotid artery.   Continue antiplatelet therapy as prescribed. Continue management of CAD, HTN and Hyperlipidemia. Healthy heart diet, encouraged exercise at least 4 times per week.     2. Atherosclerotic peripheral vascular disease with intermittent claudication (HCC)  Recommend:   The  patient has evidence of atherosclerosis of the lower extremities with claudication.  The patient does not voice lifestyle limiting changes at this point in time.   Noninvasive studies do not suggest clinically significant change.   No invasive studies, angiography or surgery at this time The patient should continue walking and begin a more formal exercise program. The patient should continue antiplatelet therapy and aggressive treatment of the lipid abnormalities   No changes in the patient's medications at this time   The patient should continue wearing graduated compression socks 10-15 mmHg strength to control the mild edema.     3. Benign essential HTN Continue antihypertensive medications as already ordered, these medications have been reviewed and there are no changes at this time.    4. Hyperlipidemia, mixed Continue statin as ordered and reviewed, no changes at this time    , MD  10/22/2020 10:55 AM

## 2020-10-22 NOTE — Op Note (Signed)
OPERATIVE NOTE DATE: 10/22/2020  PROCEDURE:  Ultrasound guidance for vascular access right femoral artery  Placement of a 10 mm x 8 mm x 30 mm exact stent with the use of the NAV-6 embolic protection device in the right internal carotid artery  PRE-OPERATIVE DIAGNOSIS: 1.  Symptomatic right internal carotid artery stenosis. 2.  Multiple episodes of right eye amaurosis fugax  POST-OPERATIVE DIAGNOSIS:  Same as above  SURGEON: [name], MD  ASSISTANT(S): Annice Needy, MD  ANESTHESIA: local/MCS  ESTIMATED BLOOD LOSS: 100 cc  CONTRAST: 50 cc  FLUORO TIME: 4.9 minutes  MODERATE CONSCIOUS SEDATION TIME: Continuous ECG pulse oximetry and cardiopulmonary monitoring was performed throughout the entire procedure by the interventional radiology nurse total sedation time was 37 minutes and 9 seconds.  FINDING(S): 1.   Greater than 90% internal carotid artery stenosis at the origin of the right internal carotid artery.  SPECIMEN(S):   none  INDICATIONS:   Patient is a 60 y.o. male who presents with multiple episodes of amaurosis fugax of the right eye associated with a greater than 90% right internal carotid artery stenosis.  The patient has asymptomatic lesion with a high bifurcation and multiple comorbidities and carotid artery stenting was felt to be preferred to endarterectomy for that reason.  Risks and benefits were discussed and informed consent was obtained.   DESCRIPTION: After obtaining full informed written consent, the patient was brought back to the vascular suite and placed supine upon the table.  The patient received IV antibiotics prior to induction. Moderate conscious sedation was administered during a face to face encounter with the patient throughout the procedure with my supervision of the RN administering medicines and monitoring the patients vital signs and mental status throughout from the start of the procedure until the patient was taken to the recovery room.     After obtaining adequate sedation, the patient was prepped and draped in the standard fashion.    A first assistant is required in order to allow for a safe and more efficient operation.  Duties include wire manipulations as well as assistance with pinning the sheath and positioning the detector for proper angle, assistance and deploying the stent in the proper position and appropriate images.  Further duties include assisting with patient positioning during the procedure.  I believe that this procedure requires a first assistant in order for it to be performed at a level in keeping with the high standards of this institution.  The right femoral artery was visualized with ultrasound and found to be widely patent. It was then accessed under direct ultrasound guidance without difficulty with a micropuncture needle. A permanent image was recorded.  A microwire was then advanced without difficulty under fluoroscopic guidance followed by a micro-sheath.  A J-wire was placed and we then placed a 6 French sheath. The patient was then heparinized and a total of 8000 units of intravenous heparin were given and an ACT was checked to confirm successful anticoagulation.   A pigtail catheter was then placed into the ascending aorta. This showed a type I arch, the origin of the innominate and the origin of the right common carotid arteries are widely patent.  There is moderate tortuosity noted.  Of note, there is a greater than 95% stenosis at the origin of the left common carotid artery.  Left subclavian is widely patent. The right common carotid artery was then selectively cannulated without difficulty with a H1 catheter and the catheter advanced into the mid right common  carotid artery.  Cervical and cerebral carotid angiography was then performed. There were no obvious intracranial filling defects. The right carotid bifurcation demonstrated greater than 90% stenosis at the origin of the right internal carotid  artery.  The right external carotid artery is occluded at its origin and is nonvisualized from injection at this level.  The right internal carotid artery does have tandem 180 degree curves within its midportion.  This begins approximately 6 cm above the lesion.  I then advanced the Amplatz Super Stiff wire into the distal common carotid (since the external is occluded and could not be accessed with the H1 catheter in the typical fashion). Over the Amplatz Super Stiff wire, a 6 [place] shuttle sheath was placed into the mid common carotid artery. I then used the NAV-6  Embolic protection device and crossed the lesion and parked this in the mid internal carotid artery at the level of the first 180 degree curve.  I then selected a 10 mm x 8 mm x 30 mm exact stent. This was deployed across the lesion encompassing it in its entirety. A 5 mm x 15 mm length via track balloon was used to post dilate the stent. Only about a 15% residual stenosis was present after angioplasty. Completion angiogram showed normal intracranial filling without new defects. At this point I elected to terminate the procedure. The sheath was removed and StarClose closure device was deployed in the right femoral artery with excellent hemostatic result. The patient was taken to the recovery room in stable condition having tolerated the procedure well.  COMPLICATIONS: none  CONDITION: stable  [name] 10/22/2020 1:18 PM   This note was created with Dragon Medical transcription system. Any errors in dictation are purely unintentional.

## 2020-10-22 NOTE — Plan of Care (Signed)
  Problem: Education: Goal: Knowledge of General Education information will improve Description: Including pain rating scale, medication(s)/side effects and non-pharmacologic comfort measures Outcome: Progressing   Problem: Health Behavior/Discharge Planning: Goal: Ability to manage health-related needs will improve Outcome: Progressing   Problem: Clinical Measurements: Goal: Ability to maintain clinical measurements within normal limits will improve Outcome: Progressing Goal: Will remain free from infection Outcome: Progressing Goal: Diagnostic test results will improve Outcome: Progressing Goal: Respiratory complications will improve Outcome: Progressing Goal: Cardiovascular complication will be avoided Outcome: Progressing   Problem: Activity: Goal: Risk for activity intolerance will decrease Outcome: Progressing   Problem: Nutrition: Goal: Adequate nutrition will be maintained Outcome: Progressing   Problem: Coping: Goal: Level of anxiety will decrease Outcome: Progressing   Problem: Elimination: Goal: Will not experience complications related to bowel motility Outcome: Progressing Goal: Will not experience complications related to urinary retention Outcome: Progressing   Problem: Pain Managment: Goal: General experience of comfort will improve Outcome: Progressing   Problem: Safety: Goal: Ability to remain free from injury will improve Outcome: Progressing   Problem: Skin Integrity: Goal: Risk for impaired skin integrity will decrease Outcome: Progressing   Problem: Education: Goal: Knowledge of discharge needs will improve Outcome: Progressing   Problem: Clinical Measurements: Goal: Postoperative complications will be avoided or minimized Outcome: Progressing   Problem: Respiratory: Goal: Ability to achieve and maintain a regular respiratory rate will improve Outcome: Progressing   Problem: Skin Integrity: Goal: Demonstration of wound healing  without infection will improve Outcome: Progressing   

## 2020-10-22 NOTE — Progress Notes (Signed)
Patient tolerated procedure well. Alert and oriented x 4 at baseline postprocedure. Grip strength bilaterally strong. PERRLA, 63mm, brisk. Patient able to tolerate fluids well without difficulty and without coughing. Patient's wife at bedside.

## 2020-10-22 NOTE — Interval H&P Note (Signed)
History and Physical Interval Note:  10/22/2020 11:00 AM  [name]  has presented today for surgery, with the diagnosis of RT Carotid Stent   ABBOTT   Carotid artery stenosis.  The various methods of treatment have been discussed with the patient and family. After consideration of risks, benefits and other options for treatment, the patient has consented to  Procedure(s): CAROTID PTA/STENT INTERVENTION (Right) as a surgical intervention.  The patient's history has been reviewed, patient examined, no change in status, stable for surgery.  I have reviewed the patient's chart and labs.  Questions were answered to the patient's satisfaction.     [name]

## 2020-10-22 NOTE — Progress Notes (Signed)
Patient rt femoral PAD has visible blood within, did not deflate due to active bleeding. Patient intake adequate but fluids remain for soft BP. Will continue to monitor.

## 2020-10-23 ENCOUNTER — Encounter: Payer: Self-pay | Admitting: Vascular Surgery

## 2020-10-23 DIAGNOSIS — I6521 Occlusion and stenosis of right carotid artery: Principal | ICD-10-CM

## 2020-10-23 LAB — BASIC METABOLIC PANEL
Anion gap: 4 — ABNORMAL LOW (ref 5–15)
BUN: 28 mg/dL — ABNORMAL HIGH (ref 6–20)
CO2: 23 mmol/L (ref 22–32)
Calcium: 8.6 mg/dL — ABNORMAL LOW (ref 8.9–10.3)
Chloride: 111 mmol/L (ref 98–111)
Creatinine, Ser: 1.6 mg/dL — ABNORMAL HIGH (ref 0.61–1.24)
GFR, Estimated: 49 mL/min — ABNORMAL LOW (ref >60)
Glucose, Bld: 101 mg/dL — ABNORMAL HIGH (ref 70–99)
Potassium: 4.5 mmol/L (ref 3.5–5.1)
Sodium: 138 mmol/L (ref 135–145)

## 2020-10-23 LAB — CBC
HCT: 35.3 % — ABNORMAL LOW (ref 39.0–52.0)
Hemoglobin: 12 g/dL — ABNORMAL LOW (ref 13.0–17.0)
MCH: 32.9 pg (ref 26.0–34.0)
MCHC: 34 g/dL (ref 30.0–36.0)
MCV: 96.7 fL (ref 80.0–100.0)
Platelets: 246 10*3/uL (ref 150–400)
RBC: 3.65 MIL/uL — ABNORMAL LOW (ref 4.22–5.81)
RDW: 13.6 % (ref 11.5–15.5)
WBC: 11 10*3/uL — ABNORMAL HIGH (ref 4.0–10.5)
nRBC: 0 % (ref 0.0–0.2)

## 2020-10-23 NOTE — Discharge Summary (Signed)
[facility]    Discharge Summary  Patient ID:  [name] MRN: [id number] DOB/AGE: 1960-06-25 60 y.o.  Admit date: 10/22/2020 Discharge date: 10/23/2020 Date of Surgery: 10/22/2020 Surgeon: Surgeon(s): Schnier, Latina Craver, MD  Admission Diagnosis: Carotid stenosis, 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, right [I65.21]  Discharge Diagnoses:  Carotid stenosis, 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, right [I65.21]  Secondary Diagnoses: Past Medical History:  Diagnosis Date   Arthritis    Hyperlipidemia    Hypertension    Thyroid disease    hypothyrodism   Procedure(s): 10/22/20:  Ultrasound guidance for vascular access right femoral artery  Placement of a 10 mm x 8 mm x 30 mm exact stent with the use of the NAV-6 embolic protection device in the right internal carotid artery  Discharged Condition: Good  HPI / Hospital Course:  Patient is a 60 y.o. male who presents with multiple episodes of amaurosis fugax of the right eye associated with a greater than 90% right internal carotid artery stenosis.  The patient has asymptomatic lesion with a high bifurcation and multiple comorbidities and carotid artery stenting was felt to be preferred to endarterectomy for that reason.  Risks and benefits were discussed and informed consent was obtained. On 10/22/20, the patient underwent:   Ultrasound guidance for vascular access right femoral artery  Placement of a 10 mm x 8 mm x 30 mm exact stent with the use of the NAV-6 embolic protection device in the right internal carotid artery  He tolerated the procedure well was transferred from the angiography suite to the ICU for observation overnight.  The patient's night of surgery was unremarkable.  During his brief inpatient stay, his diet was advanced, he was urinating independently, his discomfort was controlled to the use of p.o. pain medication and he was ambulating at baseline.  Day of discharge, patient was afebrile with stable vital signs  and essentially unremarkable physical exam.   Physical Exam:  Alert and oriented x3, no acute distress Face: Symmetrical, tongue midline Neck: Trachea midline, no swelling or ecchymosis Cardiovascular: Regular rate and rhythm Pulmonary: Clear to auscultation bilaterally Abdomen: Soft, nontender, nondistended Right groin access site: Clean dry and intact.  No swelling or drainage. Extremities: Warm distally to toes.  Minimal edema. Neurological: Intact, no deficits  Labs: As below  Complications: None  Consults: None  Significant Diagnostic Studies: CBC Lab Results  Component Value Date   WBC 11.0 (H) 10/23/2020   HGB 12.0 (L) 10/23/2020   HCT 35.3 (L) 10/23/2020   MCV 96.7 10/23/2020   PLT 246 10/23/2020   BMET    Component Value Date/Time   NA 138 10/23/2020 0816   K 4.5 10/23/2020 0816   CL 111 10/23/2020 0816   CO2 23 10/23/2020 0816   GLUCOSE 101 (H) 10/23/2020 0816   BUN 28 (H) 10/23/2020 0816   CREATININE 1.60 (H) 10/23/2020 0816   CALCIUM 8.6 (L) 10/23/2020 0816   GFRNONAA 49 (L) 10/23/2020 0816   COAG No results found for: INR, PROTIME  Disposition:  Discharge to :Home  Allergies as of 10/23/2020   No Known Allergies      Medication List     TAKE these medications    aspirin 81 MG EC tablet Take 81 mg by mouth daily.   atorvastatin 40 MG tablet Commonly known as: LIPITOR Take 40 mg by mouth at bedtime.   clopidogrel 75 MG tablet Commonly known as: PLAVIX Take 75 mg by mouth daily.   famotidine 20 MG tablet Commonly known as:  PEPCID Take 20 mg by mouth at bedtime.   hydrochlorothiazide 25 MG tablet Commonly known as: HYDRODIURIL Take 25 mg by mouth at bedtime.   levothyroxine 150 MCG tablet Commonly known as: SYNTHROID Take 150 mcg by mouth at bedtime.   lisinopril 10 MG tablet Commonly known as: ZESTRIL Take 10 mg by mouth at bedtime.       Verbal and written Discharge instructions given to the patient. Wound care per  Discharge AVS  Follow-up Information     Schnier, Latina Craver, MD Follow up in 1 month(s).   Specialties: Vascular Surgery, Cardiology, Radiology, Vascular Surgery Why: Can see Schnier or Vivia [place]. First post-op. Will need carotid duplex with visit. Contact information: [address] [place] [address] [phone]                Signed: Tonette Lederer, PA-C  10/23/2020, 11:52 AM

## 2020-10-23 NOTE — Progress Notes (Signed)
Patient had complaints of nausea post BM. PRN Zofran given. PAD balloon deflated with scant amount of sanguinous oozing at site. No drainage when inflated. PAD re-inflated at this time. Will continue to monitor.

## 2020-10-23 NOTE — Discharge Instructions (Signed)
Vascular Surgery Discharge Instructions:  1) You may shower as of tomorrow.  Please keep your groins clean and dry.  Gently clean your groin with soap and water.  Gently pat dry. 2) Please do not engage in strenuous activity or lifting greater than 10 pounds until you are cleared at your first postoperative follow-up. 3) Please do not drive for approximately 2 weeks

## 2020-11-12 ENCOUNTER — Other Ambulatory Visit (INDEPENDENT_AMBULATORY_CARE_PROVIDER_SITE_OTHER): Payer: Self-pay | Admitting: Vascular Surgery

## 2020-11-12 DIAGNOSIS — I6521 Occlusion and stenosis of right carotid artery: Secondary | ICD-10-CM

## 2020-11-13 ENCOUNTER — Other Ambulatory Visit: Payer: Self-pay

## 2020-11-13 ENCOUNTER — Ambulatory Visit (INDEPENDENT_AMBULATORY_CARE_PROVIDER_SITE_OTHER): Payer: Commercial Managed Care - PPO | Admitting: Vascular Surgery

## 2020-11-13 ENCOUNTER — Ambulatory Visit (INDEPENDENT_AMBULATORY_CARE_PROVIDER_SITE_OTHER): Payer: Commercial Managed Care - PPO

## 2020-11-13 ENCOUNTER — Encounter (INDEPENDENT_AMBULATORY_CARE_PROVIDER_SITE_OTHER): Payer: Self-pay | Admitting: Vascular Surgery

## 2020-11-13 VITALS — BP 167/70 | HR 60 | Ht 66.0 in | Wt 181.0 lb

## 2020-11-13 DIAGNOSIS — I70219 Atherosclerosis of native arteries of extremities with intermittent claudication, unspecified extremity: Secondary | ICD-10-CM | POA: Diagnosis not present

## 2020-11-13 DIAGNOSIS — I6522 Occlusion and stenosis of left carotid artery: Secondary | ICD-10-CM

## 2020-11-13 DIAGNOSIS — I10 Essential (primary) hypertension: Secondary | ICD-10-CM | POA: Diagnosis not present

## 2020-11-13 DIAGNOSIS — E782 Mixed hyperlipidemia: Secondary | ICD-10-CM

## 2020-11-13 DIAGNOSIS — I6529 Occlusion and stenosis of unspecified carotid artery: Secondary | ICD-10-CM | POA: Insufficient documentation

## 2020-11-13 DIAGNOSIS — I6521 Occlusion and stenosis of right carotid artery: Secondary | ICD-10-CM | POA: Diagnosis not present

## 2020-11-13 NOTE — Progress Notes (Signed)
MRN : [id number]  [name] is a 60 y.o. (1960-07-29) male who presents with chief complaint of  Chief Complaint  Patient presents with   Follow-up    2 wk [facility] post Carotid stent .( Carotid ) 2 weks per GS no documentation in d/c)   .  History of Present Illness:   The patient returns to the office today to address 2 problems:  Firstly, he is status post right carotid stenting of the internal carotid artery and bifurcation on October 22, 2020.  He had a normal postoperative course and there were no complications.  With respect to this he states he is doing well he has had no recent TIA symptoms no amaurosis fugax of the right eye.  He continues his Plavix and aspirin.  Secondly, at the time of right carotid stent placement the arch aortogram demonstrated a greater than 80% stenosis at the origin of the left common carotid artery.  The patient denies left amaurosis fugax.  There have been no right-sided episodes of weakness or other TIA symptoms.  Current Meds  Medication Sig   acetaminophen (TYLENOL) 500 MG tablet    aspirin 81 MG EC tablet Take 81 mg by mouth daily.   atorvastatin (LIPITOR) 40 MG tablet Take 40 mg by mouth at bedtime.   clopidogrel (PLAVIX) 75 MG tablet Take 75 mg by mouth daily.   famotidine (PEPCID) 20 MG tablet Take 20 mg by mouth at bedtime.   hydrochlorothiazide (HYDRODIURIL) 25 MG tablet Take 25 mg by mouth at bedtime.   levothyroxine (SYNTHROID) 150 MCG tablet Take 150 mcg by mouth at bedtime.   lisinopril (ZESTRIL) 10 MG tablet Take 10 mg by mouth at bedtime.    Past Medical History:  Diagnosis Date   Arthritis    Hyperlipidemia    Hypertension    Thyroid disease    hypothyrodism    Past Surgical History:  Procedure Laterality Date   BACK SURGERY     CAROTID PTA/STENT INTERVENTION Right 10/22/2020   Procedure: CAROTID PTA/STENT INTERVENTION;  Surgeon: Renford Dills, MD;  Location: [facility] INVASIVE CV LAB;  Service: Cardiovascular;   Laterality: Right;    Social History Social History   Tobacco Use   Smoking status: Every Day   Smokeless tobacco: Never  Substance Use Topics   Alcohol use: Never   Drug use: Never    Family History Family History  Problem Relation Age of Onset   Hypothyroidism Mother    Diabetes Mother    Heart attack Mother    Cancer Father    Hypothyroidism Sister    Hypothyroidism Brother     No Known Allergies   REVIEW OF SYSTEMS (Negative unless checked)  Constitutional: [] Weight loss  [] Fever  [] Chills Cardiac: [] Chest pain   [] Chest pressure   [] Palpitations   [] Shortness of breath when laying flat   [] Shortness of breath with exertion. Vascular:  [] Pain in legs with walking   [] Pain in legs at rest  [] History of DVT   [] Phlebitis   [] Swelling in legs   [] Varicose veins   [] Non-healing ulcers Pulmonary:   [] Uses home oxygen   [] Productive cough   [] Hemoptysis   [] Wheeze  [] COPD   [] Asthma Neurologic:  [] Dizziness   [] Seizures   [] History of stroke   [] History of TIA  [] Aphasia   [] Vissual changes   [] Weakness or numbness in arm   [] Weakness or numbness in leg Musculoskeletal:   [] Joint swelling   [] Joint pain   [] Low  back pain Hematologic:  [] Easy bruising  [] Easy bleeding   [] Hypercoagulable state   [] Anemic Gastrointestinal:  [] Diarrhea   [] Vomiting  [x] Gastroesophageal reflux/heartburn   [] Difficulty swallowing. Genitourinary:  [] Chronic kidney disease   [] Difficult urination  [] Frequent urination   [] Blood in urine Skin:  [] Rashes   [] Ulcers  Psychological:  [] History of anxiety   []  History of major depression.  Physical Examination  Vitals:   11/13/20 0924  BP: (!) 167/70  Pulse: 60  Weight: 181 lb (82.1 kg)  Height: 5\' 6"  (1.676 m)   Body mass index is 29.21 kg/m. Gen: WD/WN, NAD Head: [place]/AT, No temporalis wasting.  Ear/Nose/Throat: Hearing grossly intact, nares w/o erythema or drainage Eyes: PER, EOMI, sclera nonicteric.  Neck: Supple, no large masses.    Pulmonary:  Good air movement, no audible wheezing bilaterally, no use of accessory muscles.  Cardiac: RRR, no JVD Vascular:   Bilateral carotid bruits; the left carotid pulse is present but it is very weak compared to the right Vessel Right Left  Radial Palpable Palpable  Carotid Palpable Palpable  Gastrointestinal: Non-distended. No guarding/no peritoneal signs.  Musculoskeletal: M/S 5/5 throughout.  No deformity or atrophy.  Neurologic: CN 2-12 intact. Symmetrical.  Speech is fluent. Motor exam as listed above. Psychiatric: Judgment intact, Mood & affect appropriate for pt's clinical situation. Dermatologic: No rashes or ulcers noted.  No changes consistent with cellulitis.  CBC Lab Results  Component Value Date   WBC 11.0 (H) 10/23/2020   HGB 12.0 (L) 10/23/2020   HCT 35.3 (L) 10/23/2020   MCV 96.7 10/23/2020   PLT 246 10/23/2020    BMET    Component Value Date/Time   NA 138 10/23/2020 0816   K 4.5 10/23/2020 0816   CL 111 10/23/2020 0816   CO2 23 10/23/2020 0816   GLUCOSE 101 (H) 10/23/2020 0816   BUN 28 (H) 10/23/2020 0816   CREATININE 1.60 (H) 10/23/2020 0816   CALCIUM 8.6 (L) 10/23/2020 0816   GFRNONAA 49 (L) 10/23/2020 0816   CrCl cannot be calculated (Patient's most recent lab result is older than the maximum 21 days allowed.).  COAG No results found for: INR, PROTIME  Radiology PERIPHERAL VASCULAR CATHETERIZATION  Result Date: 10/22/2020 See surgical note for result.    Assessment/Plan 1. Stenosis of left carotid artery The patient has a critical lesion at the origin of his left internal carotid artery.  This is most suitable for repair using a balloon expandable stent.  Risks and benefits of been reviewed all questions been answered patient will continue his dual antiplatelet therapy and we will move forward with stent placement in the left common carotid artery.  2. 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  However, as described above there is a critical lesion at the origin of the left common carotid artery  Continue antiplatelet therapy as prescribed Continue management of CAD, HTN and Hyperlipidemia Healthy heart diet,  encouraged exercise at least 4 times per week  Follow up in 3 months with duplex ultrasound and physical exam.  3. Atherosclerotic peripheral vascular disease with intermittent claudication (HCC)  Recommend:  The patient has evidence of atherosclerosis of the lower extremities with claudication.  The patient does not voice lifestyle limiting changes at this point in time.  Noninvasive studies do not suggest clinically significant change.  No invasive studies, angiography  or surgery at this time The patient should continue walking and begin a more formal exercise program.  The patient should continue antiplatelet therapy and aggressive treatment of the lipid abnormalities  No changes in the patient's medications at this time  The patient should continue wearing graduated compression socks 10-15 mmHg strength to control the mild edema.    4. Essential hypertension Continue antihypertensive medications as already ordered, these medications have been reviewed and there are no changes at this time.   5. Hyperlipidemia, mixed Continue statin as ordered and reviewed, no changes at this time     [name], MD  11/13/2020 4:55 PM

## 2020-11-28 ENCOUNTER — Encounter (INDEPENDENT_AMBULATORY_CARE_PROVIDER_SITE_OTHER): Payer: Self-pay

## 2020-12-17 ENCOUNTER — Encounter: Payer: Self-pay | Admitting: Vascular Surgery

## 2020-12-17 ENCOUNTER — Inpatient Hospital Stay
Admission: RE | Admit: 2020-12-17 | Discharge: 2020-12-18 | DRG: 983 | Disposition: A | Discharge status: Home / Self Care | Payer: Commercial Managed Care - PPO | Attending: Vascular Surgery | Admitting: Vascular Surgery

## 2020-12-17 ENCOUNTER — Encounter
Admission: RE | Disposition: A | Discharge status: Home / Self Care | Payer: Self-pay | Source: Home / Self Care | Attending: Vascular Surgery

## 2020-12-17 ENCOUNTER — Other Ambulatory Visit (INDEPENDENT_AMBULATORY_CARE_PROVIDER_SITE_OTHER): Payer: Self-pay | Admitting: Nurse Practitioner

## 2020-12-17 ENCOUNTER — Other Ambulatory Visit: Payer: Self-pay

## 2020-12-17 DIAGNOSIS — I6522 Occlusion and stenosis of left carotid artery: Secondary | ICD-10-CM | POA: Diagnosis present

## 2020-12-17 DIAGNOSIS — Z8249 Family history of ischemic heart disease and other diseases of the circulatory system: Secondary | ICD-10-CM

## 2020-12-17 DIAGNOSIS — Z7902 Long term (current) use of antithrombotics/antiplatelets: Secondary | ICD-10-CM | POA: Diagnosis not present

## 2020-12-17 DIAGNOSIS — I2510 Atherosclerotic heart disease of native coronary artery without angina pectoris: Secondary | ICD-10-CM | POA: Diagnosis present

## 2020-12-17 DIAGNOSIS — I10 Essential (primary) hypertension: Secondary | ICD-10-CM | POA: Diagnosis present

## 2020-12-17 DIAGNOSIS — R55 Syncope and collapse: Secondary | ICD-10-CM

## 2020-12-17 DIAGNOSIS — Z79899 Other long term (current) drug therapy: Secondary | ICD-10-CM | POA: Diagnosis not present

## 2020-12-17 DIAGNOSIS — F17210 Nicotine dependence, cigarettes, uncomplicated: Secondary | ICD-10-CM | POA: Diagnosis present

## 2020-12-17 DIAGNOSIS — Z20822 Contact with and (suspected) exposure to covid-19: Secondary | ICD-10-CM | POA: Diagnosis present

## 2020-12-17 DIAGNOSIS — Z7982 Long term (current) use of aspirin: Secondary | ICD-10-CM | POA: Diagnosis not present

## 2020-12-17 DIAGNOSIS — E782 Mixed hyperlipidemia: Secondary | ICD-10-CM | POA: Diagnosis present

## 2020-12-17 DIAGNOSIS — R609 Edema, unspecified: Secondary | ICD-10-CM | POA: Diagnosis present

## 2020-12-17 DIAGNOSIS — Z79890 Hormone replacement therapy (postmenopausal): Secondary | ICD-10-CM | POA: Diagnosis not present

## 2020-12-17 DIAGNOSIS — I70203 Unspecified atherosclerosis of native arteries of extremities, bilateral legs: Secondary | ICD-10-CM | POA: Diagnosis present

## 2020-12-17 HISTORY — PX: CAROTID PTA/STENT INTERVENTION: CATH118231

## 2020-12-17 LAB — GLUCOSE, CAPILLARY: Glucose-Capillary: 84 mg/dL (ref 70–99)

## 2020-12-17 LAB — MRSA NEXT GEN BY PCR, NASAL: MRSA by PCR Next Gen: NOT DETECTED

## 2020-12-17 LAB — CREATININE, SERUM
Creatinine, Ser: 1.77 mg/dL — ABNORMAL HIGH (ref 0.61–1.24)
GFR, Estimated: 44 mL/min — ABNORMAL LOW (ref >60)

## 2020-12-17 LAB — SARS CORONAVIRUS 2 (TAT 6-24 HRS): SARS Coronavirus 2: NEGATIVE

## 2020-12-17 LAB — BUN: BUN: 31 mg/dL — ABNORMAL HIGH (ref 6–20)

## 2020-12-17 SURGERY — CAROTID PTA/STENT INTERVENTION
Anesthesia: Moderate Sedation | Laterality: Left

## 2020-12-17 MED ORDER — CLOPIDOGREL BISULFATE 75 MG PO TABS
75.0000 mg | ORAL_TABLET | Freq: Every day | ORAL | Status: DC
  Administered 2020-12-17: 75 mg via ORAL
  Filled 2020-12-17: qty 1

## 2020-12-17 MED ORDER — METOPROLOL TARTRATE 5 MG/5ML IV SOLN: 2.0000 mg | INTRAVENOUS | Status: DC | PRN

## 2020-12-17 MED ORDER — LEVOTHYROXINE SODIUM 137 MCG PO TABS
137.0000 ug | ORAL_TABLET | Freq: Every morning | ORAL | Status: DC
  Administered 2020-12-18: 137 ug via ORAL
  Filled 2020-12-17: qty 1

## 2020-12-17 MED ORDER — OXYCODONE-ACETAMINOPHEN 5-325 MG PO TABS: 1.0000 | ORAL_TABLET | ORAL | Status: DC | PRN

## 2020-12-17 MED ORDER — HEPARIN SODIUM (PORCINE) 1000 UNIT/ML IJ SOLN
INTRAMUSCULAR | Status: DC | PRN
  Administered 2020-12-17: 1000 [IU] via INTRAVENOUS
  Administered 2020-12-17: 8000 [IU] via INTRAVENOUS

## 2020-12-17 MED ORDER — METHYLPREDNISOLONE SODIUM SUCC 125 MG IJ SOLR: 125.0000 mg | Freq: Once | INTRAMUSCULAR | Status: DC | PRN

## 2020-12-17 MED ORDER — ACETAMINOPHEN 325 MG RE SUPP
325.0000 mg | RECTAL | Status: DC | PRN
  Filled 2020-12-17: qty 2

## 2020-12-17 MED ORDER — CHLORHEXIDINE GLUCONATE CLOTH 2 % EX PADS
6.0000 | MEDICATED_PAD | Freq: Every day | CUTANEOUS | Status: DC
  Administered 2020-12-17: 6 via TOPICAL

## 2020-12-17 MED ORDER — MORPHINE SULFATE (PF) 4 MG/ML IV SOLN: 2.0000 mg | INTRAVENOUS | Status: DC | PRN

## 2020-12-17 MED ORDER — CEFAZOLIN SODIUM-DEXTROSE 2-4 GM/100ML-% IV SOLN
2.0000 g | Freq: Three times a day (TID) | INTRAVENOUS | Status: AC
Start: 2020-12-17 — End: 2020-12-18
  Administered 2020-12-17 – 2020-12-18 (×2): 2 g via INTRAVENOUS
  Filled 2020-12-17 (×4): qty 100

## 2020-12-17 MED ORDER — LISINOPRIL 20 MG PO TABS
20.0000 mg | ORAL_TABLET | Freq: Every day | ORAL | Status: DC
  Administered 2020-12-17: 20 mg via ORAL
  Filled 2020-12-17: qty 1

## 2020-12-17 MED ORDER — SODIUM CHLORIDE 0.9 % IV SOLN: INTRAVENOUS | Status: DC

## 2020-12-17 MED ORDER — HEPARIN SODIUM (PORCINE) 1000 UNIT/ML IJ SOLN
INTRAMUSCULAR | Status: AC
  Filled 2020-12-17: qty 1

## 2020-12-17 MED ORDER — GUAIFENESIN-DM 100-10 MG/5ML PO SYRP
15.0000 mL | ORAL_SOLUTION | ORAL | Status: DC | PRN
  Filled 2020-12-17: qty 15

## 2020-12-17 MED ORDER — PHENOL 1.4 % MT LIQD
1.0000 | OROMUCOSAL | Status: DC | PRN
  Filled 2020-12-17: qty 177

## 2020-12-17 MED ORDER — ASPIRIN EC 81 MG PO TBEC
81.0000 mg | DELAYED_RELEASE_TABLET | Freq: Every day | ORAL | Status: DC
  Administered 2020-12-17: 81 mg via ORAL
  Filled 2020-12-17: qty 1

## 2020-12-17 MED ORDER — FAMOTIDINE 20 MG PO TABS: 40.0000 mg | ORAL_TABLET | Freq: Once | ORAL | Status: DC | PRN

## 2020-12-17 MED ORDER — PANTOPRAZOLE SODIUM 40 MG IV SOLR
40.0000 mg | Freq: Every day | INTRAVENOUS | Status: DC
  Administered 2020-12-17: 40 mg via INTRAVENOUS
  Filled 2020-12-17: qty 40

## 2020-12-17 MED ORDER — MIDAZOLAM HCL 2 MG/2ML IJ SOLN
INTRAMUSCULAR | Status: DC | PRN
  Administered 2020-12-17: 2 mg via INTRAVENOUS
  Administered 2020-12-17: 1 mg via INTRAVENOUS

## 2020-12-17 MED ORDER — ACETAMINOPHEN 325 MG PO TABS: 325.0000 mg | ORAL_TABLET | ORAL | Status: DC | PRN

## 2020-12-17 MED ORDER — ALUM & MAG HYDROXIDE-SIMETH 200-200-20 MG/5ML PO SUSP: 15.0000 mL | ORAL | Status: DC | PRN

## 2020-12-17 MED ORDER — KCL IN DEXTROSE-NACL 20-5-0.9 MEQ/L-%-% IV SOLN
INTRAVENOUS | Status: DC
  Filled 2020-12-17 (×3): qty 1000

## 2020-12-17 MED ORDER — ONDANSETRON HCL 4 MG/2ML IJ SOLN: 4.0000 mg | Freq: Four times a day (QID) | INTRAMUSCULAR | Status: DC | PRN

## 2020-12-17 MED ORDER — DIPHENHYDRAMINE HCL 50 MG/ML IJ SOLN: 50.0000 mg | Freq: Once | INTRAMUSCULAR | Status: DC | PRN

## 2020-12-17 MED ORDER — LABETALOL HCL 5 MG/ML IV SOLN: 10.0000 mg | INTRAVENOUS | Status: DC | PRN

## 2020-12-17 MED ORDER — SODIUM CHLORIDE 0.9 % IV SOLN
INTRAVENOUS | Status: AC | PRN
  Administered 2020-12-17: 500 mL via INTRAVENOUS

## 2020-12-17 MED ORDER — ATORVASTATIN CALCIUM 20 MG PO TABS
40.0000 mg | ORAL_TABLET | Freq: Every day | ORAL | Status: DC
  Administered 2020-12-17: 40 mg via ORAL
  Filled 2020-12-17: qty 2

## 2020-12-17 MED ORDER — DOCUSATE SODIUM 100 MG PO CAPS
100.0000 mg | ORAL_CAPSULE | Freq: Every day | ORAL | Status: DC
  Administered 2020-12-18: 100 mg via ORAL
  Filled 2020-12-17: qty 1

## 2020-12-17 MED ORDER — FENTANYL CITRATE (PF) 100 MCG/2ML IJ SOLN
INTRAMUSCULAR | Status: DC | PRN
  Administered 2020-12-17 (×2): 50 ug via INTRAVENOUS

## 2020-12-17 MED ORDER — PHENYLEPHRINE HCL (PRESSORS) 10 MG/ML IV SOLN
INTRAVENOUS | Status: AC
  Filled 2020-12-17: qty 1

## 2020-12-17 MED ORDER — ATROPINE SULFATE 1 MG/10ML IJ SOSY
PREFILLED_SYRINGE | INTRAMUSCULAR | Status: AC
  Filled 2020-12-17: qty 10

## 2020-12-17 MED ORDER — MIDAZOLAM HCL 2 MG/ML PO SYRP: 8.0000 mg | ORAL_SOLUTION | Freq: Once | ORAL | Status: DC | PRN

## 2020-12-17 MED ORDER — FENTANYL CITRATE PF 50 MCG/ML IJ SOSY
PREFILLED_SYRINGE | INTRAMUSCULAR | Status: AC
  Filled 2020-12-17: qty 1

## 2020-12-17 MED ORDER — HYDRALAZINE HCL 20 MG/ML IJ SOLN: 5.0000 mg | INTRAMUSCULAR | Status: DC | PRN

## 2020-12-17 MED ORDER — HYDROMORPHONE HCL 1 MG/ML IJ SOLN: 1.0000 mg | Freq: Once | INTRAMUSCULAR | Status: DC | PRN

## 2020-12-17 MED ORDER — SODIUM CHLORIDE 0.9 % IV SOLN: 500.0000 mL | Freq: Once | INTRAVENOUS | Status: DC | PRN

## 2020-12-17 MED ORDER — CEFAZOLIN SODIUM-DEXTROSE 2-4 GM/100ML-% IV SOLN
INTRAVENOUS | Status: AC
  Administered 2020-12-17: 2 g via INTRAVENOUS
  Filled 2020-12-17: qty 100

## 2020-12-17 MED ORDER — MIDAZOLAM HCL 2 MG/2ML IJ SOLN
INTRAMUSCULAR | Status: AC
  Filled 2020-12-17: qty 4

## 2020-12-17 MED ORDER — CEFAZOLIN SODIUM-DEXTROSE 2-4 GM/100ML-% IV SOLN: 2.0000 g | Freq: Once | INTRAVENOUS | Status: AC

## 2020-12-17 MED ORDER — ATROPINE SULFATE 1 MG/10ML IJ SOSY
PREFILLED_SYRINGE | INTRAMUSCULAR | Status: DC | PRN
  Administered 2020-12-17: 1 mg via INTRAVENOUS

## 2020-12-17 SURGICAL SUPPLY — 23 items
CATH ANGIO 5F PIGTAIL 100CM (CATHETERS) ×2 IMPLANT
CATH BEACON 5 .035 100 H1 TIP (CATHETERS) ×2 IMPLANT
CATH BEACON 5 .035 100 SIM1 TP (CATHETERS) ×2 IMPLANT
COVER DRAPE FLUORO 36X44 (DRAPES) ×2 IMPLANT
COVER PROBE U/S 5X48 (MISCELLANEOUS) ×2 IMPLANT
DEVICE EMBOSHIELD NAV6 4.0-7.0 (FILTER) ×2 IMPLANT
DEVICE SAFEGUARD 24CM (GAUZE/BANDAGES/DRESSINGS) ×2 IMPLANT
DEVICE STARCLOSE SE CLOSURE (Vascular Products) ×2 IMPLANT
DEVICE TORQUE .025-.038 (MISCELLANEOUS) ×2 IMPLANT
GLIDEWIRE ANGLED SS 035X260CM (WIRE) ×2 IMPLANT
GUIDEWIRE VASC STIFF .038X260 (WIRE) ×2 IMPLANT
KIT CAROTID MANIFOLD (MISCELLANEOUS) ×2 IMPLANT
KIT ENCORE 26 ADVANTAGE (KITS) ×2 IMPLANT
KIT MICROPUNCTURE NIT STIFF (SHEATH) ×2 IMPLANT
NEEDLE ENTRY 21GA 7CM ECHOTIP (NEEDLE) ×2 IMPLANT
PACK ANGIOGRAPHY (CUSTOM PROCEDURE TRAY) ×2 IMPLANT
SHEATH BRITE TIP 5FRX11 (SHEATH) ×2 IMPLANT
SHEATH SHUTTLE 7FR (SHEATH) ×2 IMPLANT
STENT LIFESTREAM 8X26X135 (Permanent Stent) ×2 IMPLANT
SYR MEDRAD MARK 7 150ML (SYRINGE) ×2 IMPLANT
TUBING CONTRAST HIGH PRESS 72 (TUBING) ×2 IMPLANT
WIRE BAREWIRE WORK .014X315CM (WIRE) ×2 IMPLANT
WIRE GUIDERIGHT .035X150 (WIRE) ×2 IMPLANT

## 2020-12-17 NOTE — Progress Notes (Signed)
On first assessment, rt groin PAD level 1. Minimal sanguinous drainage. Pt cleaned, PAD changed. Drainage not active. Will continue to monitor.

## 2020-12-17 NOTE — Plan of Care (Signed)

## 2020-12-17 NOTE — Op Note (Signed)
OPERATIVE NOTE DATE: 12/17/2020  PROCEDURE:  Ultrasound guidance for vascular access right femoral artery  Placement of a 8 mm x 26 mm lifestream stent with the use of the NAV-6 embolic protection device in the left common carotid artery  PRE-OPERATIVE DIAGNOSIS: 1. Left common carotid artery stenosis >80%. 2. Syncope with visual changes  POST-OPERATIVE DIAGNOSIS:  Same as above  SURGEON: [name]  ASSISTANT(S):  None  ANESTHESIA: local/MCS  ESTIMATED BLOOD LOSS:  100 cc  CONTRAST: 60 cc  FLUORO TIME: 7.7 minutes  MODERATE CONSCIOUS SEDATION TIME: Continuous ECG pulse oximetry and cardiopulmonary monitoring was performed throughout the entire procedure by the interventional radiology nurse total sedation time was 59 minutes and 49 seconds   FINDING(S): 1.   greater than 80% left common carotid artery stenosis  SPECIMEN(S):   none  INDICATIONS:   Patient is a 60 y.o. male who presents with critical left common carotid artery stenosis.  The patient has an ostial lesion in his chest and therefore carotid artery stenting was felt to be preferred to endarterectomy for that reason.  Risks and benefits were discussed and informed consent was obtained.   DESCRIPTION: After obtaining full informed written consent, the patient was brought back to the vascular suite and placed supine upon the table.  The patient received IV antibiotics prior to induction. Moderate conscious sedation was administered during a face to face encounter with the patient throughout the procedure with my supervision of the RN administering medicines and monitoring the patients vital signs and mental status throughout from the start of the procedure until the patient was taken to the recovery room.    After obtaining adequate sedation, the patient was prepped and draped in the standard fashion.    A first assistant is required in order to allow for a safe and more efficient operation.  Duties include  wire manipulations as well as assistance with pinning the sheath and positioning the detector for proper angle, assistance and deploying the stent in the proper position and appropriate images.  Further duties include assisting with patient positioning during the procedure.  I believe that this procedure requires a first assistant in order for it to be performed at a level in keeping with the high standards of this institution.  The right femoral artery was visualized with ultrasound and found to be widely patent. It was then accessed under direct ultrasound guidance without difficulty with a micropuncture needle. A permanent image was recorded.  A microwire was then advanced without difficulty under fluoroscopic guidance followed by a micro-sheath.  A J-wire was placed and we then placed a 6 French sheath. The patient was then heparinized and a total of 9000 units of intravenous heparin were given and an ACT was checked to confirm successful anticoagulation.   A pigtail catheter was then placed into the ascending aorta. This showed a type II arch with a greater than 80% stenosis at the ostia of the left common carotid artery. The left common carotid artery was then selectively cannulated without difficulty with a Simmons 1 catheter and the catheter advanced into the mid left common carotid artery.  Cervical and cerebral carotid angiography was then performed. There were no obvious intracranial filling defects. The carotid bifurcation demonstrated less than 20% stenosis.  I then advanced into the external carotid artery with a Glidewire and the Simmons catheter and then exchanged for the Amplatz Super Stiff wire. Over the Amplatz Super Stiff wire, a 6 [place] shuttle sheath was placed into  the mid common carotid artery. I then used the NAV-6  Embolic protection device and crossed the lesion and parked this in the distal internal carotid artery at the base of the skull.  I then selected a 8 mm x 26 mm lifestream  stent.  This stent was advanced into the left common carotid artery proper and the sheath was then pulled back into the aortic arch just below the ostia of the left common carotid artery.  Hand-injection of contrast was then used to localize the lesion.  The stent was then pulled back so that it protruded into the aortic arch by approximately 3 to 4 mm.  The lifestream stent was deployed across the lesion encompassing it in its entirety.  This is a balloon expandable stent and it was fully expanded to the 8 mm.  Follow-up imaging was then performed. Only about a 5% residual stenosis was present after angioplasty.  Filter was then retrieved in the usual fashion.  Completion angiogram showed normal filling without new defects. At this point I elected to terminate the procedure. The sheath was removed and StarClose closure device was deployed in the right femoral artery with excellent hemostatic result. The patient was taken to the recovery room in stable condition having tolerated the procedure well.  COMPLICATIONS: none  CONDITION: stable  [name] 12/17/2020 10:17 AM   This note was created with Dragon Medical transcription system. Any errors in dictation are purely unintentional.

## 2020-12-17 NOTE — H&P (Signed)
@LOGO @   MRN :  [name] is a 60 y.o. ([birth year]) male who presents with chief complaint of carotid blockage.  History of Present Illness:  The patient presents for treatment of carotid stenosis.  He is status post right carotid stenting of the internal carotid artery and bifurcation on October 22, 2020.  He had a normal postoperative course and there were no complications.  With respect to this he states he is doing well he has had no recent TIA symptoms no amaurosis fugax of the right eye.  He continues his Plavix and aspirin.  At the time of right carotid stent placement the arch aortogram demonstrated a greater than 80% stenosis at the origin of the left common carotid artery.  The patient denies left amaurosis fugax.  There have been no right-sided episodes of weakness or other TIA symptoms.  Current Meds  Medication Sig   acetaminophen (TYLENOL) 500 MG tablet Take 1,000 mg by mouth every 6 (six) hours as needed for mild pain.   aspirin 81 MG EC tablet Take 81 mg by mouth at bedtime.   atorvastatin (LIPITOR) 40 MG tablet Take 40 mg by mouth at bedtime.   clopidogrel (PLAVIX) 75 MG tablet Take 75 mg by mouth at bedtime.   hydrochlorothiazide (HYDRODIURIL) 25 MG tablet Take 25 mg by mouth at bedtime.   levothyroxine (SYNTHROID) 137 MCG tablet Take 137 mcg by mouth at bedtime.   lisinopril (ZESTRIL) 20 MG tablet Take 20 mg by mouth at bedtime.   [DISCONTINUED] levothyroxine (SYNTHROID) 150 MCG tablet Take 150 mcg by mouth at bedtime.    Past Medical History:  Diagnosis Date   Arthritis    Hyperlipidemia    Hypertension    Thyroid disease    hypothyrodism    Past Surgical History:  Procedure Laterality Date   BACK SURGERY     CAROTID PTA/STENT INTERVENTION Right 10/22/2020   Procedure: CAROTID PTA/STENT INTERVENTION;  Surgeon: 10/24/2020, MD;  Location: [facility] INVASIVE CV LAB;  Service: Cardiovascular;  Laterality: Right;    Social History Social  History   Tobacco Use   Smoking status: Every Day    Packs/day: 1.00    Years: 47.00    Pack years: 47.00    Types: Cigarettes   Smokeless tobacco: Never  Substance Use Topics   Alcohol use: Never   Drug use: Never    Family History Family History  Problem Relation Age of Onset   Hypothyroidism Mother    Diabetes Mother    Heart attack Mother    Cancer Father    Hypothyroidism Sister    Hypothyroidism Brother     No Known Allergies   REVIEW OF SYSTEMS (Negative unless checked)  Constitutional: [] Weight loss  [] Fever  [] Chills Cardiac: [] Chest pain   [] Chest pressure   [] Palpitations   [] Shortness of breath when laying flat   [] Shortness of breath with exertion. Vascular:  [] Pain in legs with walking   [] Pain in legs at rest  [] History of DVT   [] Phlebitis   [] Swelling in legs   [] Varicose veins   [] Non-healing ulcers Pulmonary:   [] Uses home oxygen   [] Productive cough   [] Hemoptysis   [] Wheeze  [] COPD   [] Asthma Neurologic:  [] Dizziness   [] Seizures   [] History of stroke   [] History of TIA  [] Aphasia   [] Vissual changes   [] Weakness or numbness in arm   [] Weakness or numbness in leg Musculoskeletal:   [] Joint swelling   [] Joint pain   []   Low back pain Hematologic:  [] Easy bruising  [] Easy bleeding   [] Hypercoagulable state   [] Anemic Gastrointestinal:  [] Diarrhea   [] Vomiting  [] Gastroesophageal reflux/heartburn   [] Difficulty swallowing. Genitourinary:  [] Chronic kidney disease   [] Difficult urination  [] Frequent urination   [] Blood in urine Skin:  [] Rashes   [] Ulcers  Psychological:  [] History of anxiety   []  History of major depression.  Physical Examination  Vitals:   12/17/20 0743  BP: (!) 157/65  Pulse: 76  Resp: 16  Temp: 97.8 F (36.6 C)  TempSrc: Oral  SpO2: 98%  Weight: 78.5 kg  Height: 5\' 6"  (1.676 m)   Body mass index is 27.92 kg/m. Gen: WD/WN, NAD Head: [place]/AT, No temporalis wasting.  Ear/Nose/Throat: Hearing grossly intact, nares w/o erythema  or drainage Eyes: PER, EOMI, sclera nonicteric.  Neck: Supple, no masses.  No bruit or JVD.  Pulmonary:  Good air movement, no audible wheezing, no use of accessory muscles.  Cardiac: RRR, normal S1, S2, no Murmurs. Vascular:   right carotid bruit noted Vessel Right Left  Radial Palpable Palpable  Carotid Palpable Trace Palpable  Gastrointestinal: soft, non-distended. No guarding/no peritoneal signs.  Musculoskeletal: M/S 5/5 throughout.  No visible deformity.  Neurologic: CN 2-12 intact. Pain and light touch intact in extremities.  Symmetrical.  Speech is fluent. Motor exam as listed above. Psychiatric: Judgment intact, Mood & affect appropriate for pt's clinical situation. Dermatologic: No rashes or ulcers noted.  No changes consistent with cellulitis.   CBC Lab Results  Component Value Date   WBC 11.0 (H) 10/23/2020   HGB 12.0 (L) 10/23/2020   HCT 35.3 (L) 10/23/2020   MCV 96.7 10/23/2020   PLT 246 10/23/2020    BMET    Component Value Date/Time   NA 138 10/23/2020 0816   K 4.5 10/23/2020 0816   CL 111 10/23/2020 0816   CO2 23 10/23/2020 0816   GLUCOSE 101 (H) 10/23/2020 0816   BUN 31 (H) 12/17/2020 0742   CREATININE 1.77 (H) 12/17/2020 0742   CALCIUM 8.6 (L) 10/23/2020 0816   GFRNONAA 44 (L) 12/17/2020 0742   Estimated Creatinine Clearance: 44.3 mL/min (A) (by C-G formula based on SCr of 1.77 mg/dL (H)).  COAG No results found for: INR, PROTIME  Radiology No results found.   Assessment/Plan 1. Stenosis of left carotid artery The patient has a critical lesion at the origin of his left internal carotid artery.  This is most suitable for repair using a balloon expandable stent.  Risks and benefits of been reviewed all questions been answered patient will continue his dual antiplatelet therapy and we will move forward with stent placement in the left common carotid artery.   2. 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  However, as described above there is a critical lesion at the origin of the left common carotid artery   Continue antiplatelet therapy as prescribed Continue management of CAD, HTN and Hyperlipidemia Healthy heart diet,  encouraged exercise at least 4 times per week   Follow up in 3 months with duplex ultrasound and physical exam.   3. Atherosclerotic peripheral vascular disease with intermittent claudication (HCC)  Recommend:   The patient has evidence of atherosclerosis of the lower extremities with claudication.  The patient does not voice lifestyle limiting changes at this point in time.   Noninvasive  studies do not suggest clinically significant change.   No invasive studies, angiography or surgery at this time The patient should continue walking and begin a more formal exercise program.  The patient should continue antiplatelet therapy and aggressive treatment of the lipid abnormalities   No changes in the patient's medications at this time   The patient should continue wearing graduated compression socks 10-15 mmHg strength to control the mild edema.     4. Essential hypertension Continue antihypertensive medications as already ordered, these medications have been reviewed and there are no changes at this time.    5. Hyperlipidemia, mixed Continue statin as ordered and reviewed, no changes at this time      [name], MD  12/17/2020 8:17 AM

## 2020-12-17 NOTE — Interval H&P Note (Signed)
History and Physical Interval Note:  12/17/2020 8:21 AM  [name]  has presented today for surgery, with the diagnosis of LT Carotid Stent   ABBOTT    Carotid artery stenosis Dr Gilda Crease w Dr Wyn Quaker to assist.  The various methods of treatment have been discussed with the patient and family. After consideration of risks, benefits and other options for treatment, the patient has consented to  Procedure(s): CAROTID PTA/STENT INTERVENTION (Left) as a surgical intervention.  The patient's history has been reviewed, patient examined, no change in status, stable for surgery.  I have reviewed the patient's chart and labs.  Questions were answered to the patient's satisfaction.     [name]

## 2020-12-18 ENCOUNTER — Encounter: Payer: Self-pay | Admitting: Vascular Surgery

## 2020-12-18 LAB — CBC
HCT: 33.4 % — ABNORMAL LOW (ref 39.0–52.0)
Hemoglobin: 11.6 g/dL — ABNORMAL LOW (ref 13.0–17.0)
MCH: 32.7 pg (ref 26.0–34.0)
MCHC: 34.7 g/dL (ref 30.0–36.0)
MCV: 94.1 fL (ref 80.0–100.0)
Platelets: 252 10*3/uL (ref 150–400)
RBC: 3.55 MIL/uL — ABNORMAL LOW (ref 4.22–5.81)
RDW: 13.2 % (ref 11.5–15.5)
WBC: 10 10*3/uL (ref 4.0–10.5)
nRBC: 0 % (ref 0.0–0.2)

## 2020-12-18 LAB — BASIC METABOLIC PANEL
Anion gap: 5 (ref 5–15)
BUN: 29 mg/dL — ABNORMAL HIGH (ref 6–20)
CO2: 22 mmol/L (ref 22–32)
Calcium: 8.9 mg/dL (ref 8.9–10.3)
Chloride: 111 mmol/L (ref 98–111)
Creatinine, Ser: 1.39 mg/dL — ABNORMAL HIGH (ref 0.61–1.24)
GFR, Estimated: 58 mL/min — ABNORMAL LOW (ref >60)
Glucose, Bld: 114 mg/dL — ABNORMAL HIGH (ref 70–99)
Potassium: 4.3 mmol/L (ref 3.5–5.1)
Sodium: 138 mmol/L (ref 135–145)

## 2020-12-18 LAB — POCT ACTIVATED CLOTTING TIME: Activated Clotting Time: 295 seconds

## 2020-12-18 MED ORDER — CLOPIDOGREL BISULFATE 75 MG PO TABS: 75.0000 mg | ORAL_TABLET | Freq: Every day | ORAL | 5 refills | Status: DC

## 2020-12-18 MED ORDER — LEVOTHYROXINE SODIUM 137 MCG PO TABS: 137.0000 ug | ORAL_TABLET | Freq: Every morning | ORAL | 11 refills | Status: DC

## 2020-12-18 MED ORDER — ASPIRIN 81 MG PO TBEC: 81.0000 mg | DELAYED_RELEASE_TABLET | Freq: Every day | ORAL | 11 refills | Status: AC

## 2020-12-18 NOTE — Discharge Summary (Signed)
[facility]    Discharge Summary    Patient ID:  [name] MRN: [id number] DOB/AGE: Aug 29, 1960 60 y.o.  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  He was brought to special procedures where a common carotid stent was placed without complication.  Postoperatively he has a normal course.  He is fit for discharge this morning.  He will be discharged home  Hospital Course:  [name] is a 60 y.o. male is S/P Left Procedure(s): CAROTID PTA/STENT INTERVENTION Extubated: POD # 0 Physical exam: Neuro intact right groin clean dry and intact no evidence hematoma Post-op wounds clean, dry, intact or healing well Pt. Ambulating, voiding and taking PO diet without difficulty. Pt pain controlled with PO pain meds. Labs as below Complications:none  Consults:    Significant Diagnostic Studies: CBC Lab Results  Component Value Date   WBC 10.0 12/18/2020   HGB 11.6 (L) 12/18/2020   HCT 33.4 (L) 12/18/2020   MCV 94.1 12/18/2020   PLT 252 12/18/2020    BMET    Component Value Date/Time   NA 138 12/18/2020 0554   K 4.3 12/18/2020 0554   CL 111 12/18/2020 0554   CO2 22 12/18/2020 0554   GLUCOSE 114 (H) 12/18/2020 0554   BUN 29 (H) 12/18/2020 0554   CREATININE 1.39 (H) 12/18/2020 0554   CALCIUM 8.9 12/18/2020 0554   GFRNONAA 58 (L) 12/18/2020 0554    COAG No results found for: INR, PROTIME   Disposition:  Discharge to :Home Discharge Instructions     Call MD for:  redness, tenderness, or signs of infection (pain, swelling, bleeding, redness, odor or green/yellow discharge around incision site)   Complete by: As directed    Call MD for:  severe or increased pain, loss or decreased feeling  in affected limb(s)   Complete by: As directed    Call MD for:  temperature >100.5   Complete by: As directed    Discharge instructions   Complete by: As directed    OK to shower Please remove the right groin bandage before showering and replace with a clean bandage as needed   Driving Restrictions   Complete by: As directed    No driving for 36 hours   Lifting restrictions   Complete by: As directed    No lifting greater than 15 pounds for two weeks   No dressing needed   Complete by: As directed    Replace only if drainage present   Resume previous diet   Complete by: As directed        Verbal and written Discharge instructions given to the patient. Wound care per Discharge AVS  Follow-up Information     Nylene Inlow, Latina Craver, MD Follow up in 3 week(s).   Specialties: Vascular Surgery, Cardiology, Radiology, Vascular Surgery Why: follow up after procedure with a carotid duplex Contact information: [address] [place] [address] [phone]  Signed: [name], MD  12/18/2020, 9:48 AM

## 2021-01-08 ENCOUNTER — Ambulatory Visit (INDEPENDENT_AMBULATORY_CARE_PROVIDER_SITE_OTHER): Payer: Commercial Managed Care - PPO | Admitting: Vascular Surgery

## 2021-01-08 ENCOUNTER — Encounter (INDEPENDENT_AMBULATORY_CARE_PROVIDER_SITE_OTHER): Payer: Commercial Managed Care - PPO

## 2021-01-21 ENCOUNTER — Other Ambulatory Visit (INDEPENDENT_AMBULATORY_CARE_PROVIDER_SITE_OTHER): Payer: Self-pay | Admitting: Vascular Surgery

## 2021-01-21 DIAGNOSIS — I6522 Occlusion and stenosis of left carotid artery: Secondary | ICD-10-CM

## 2021-01-21 DIAGNOSIS — Z9889 Other specified postprocedural states: Secondary | ICD-10-CM

## 2021-01-21 DIAGNOSIS — Z95828 Presence of other vascular implants and grafts: Secondary | ICD-10-CM

## 2021-01-22 ENCOUNTER — Other Ambulatory Visit: Payer: Self-pay

## 2021-01-22 ENCOUNTER — Ambulatory Visit (INDEPENDENT_AMBULATORY_CARE_PROVIDER_SITE_OTHER): Payer: Commercial Managed Care - PPO

## 2021-01-22 ENCOUNTER — Ambulatory Visit (INDEPENDENT_AMBULATORY_CARE_PROVIDER_SITE_OTHER): Payer: Commercial Managed Care - PPO | Admitting: Vascular Surgery

## 2021-01-22 VITALS — BP 165/74 | HR 66 | Ht 65.0 in | Wt 181.0 lb

## 2021-01-22 DIAGNOSIS — I6521 Occlusion and stenosis of right carotid artery: Secondary | ICD-10-CM

## 2021-01-22 DIAGNOSIS — I6522 Occlusion and stenosis of left carotid artery: Secondary | ICD-10-CM

## 2021-01-22 DIAGNOSIS — Z9889 Other specified postprocedural states: Secondary | ICD-10-CM

## 2021-01-22 DIAGNOSIS — Z95828 Presence of other vascular implants and grafts: Secondary | ICD-10-CM

## 2021-01-22 NOTE — Progress Notes (Signed)
    Patient ID: [name], male   DOB: February 16, 1961, 60 y.o.   MRN: [id number]  Chief Complaint  Patient presents with   Follow-up    3 [facility] post carotid stent intervention . carotid    HPI Johann Santone is a 60 y.o. male.    Procedure 12/17/2020:  Placement of a 8 mm x 26 mm lifestream stent with the use of the NAV-6 embolic protection device in the left common carotid artery   No neuro changes No groin c/o   Past Medical History:  Diagnosis Date   Arthritis    Hyperlipidemia    Hypertension    Thyroid disease    hypothyrodism    Past Surgical History:  Procedure Laterality Date   BACK SURGERY     CAROTID PTA/STENT INTERVENTION Right 10/22/2020   Procedure: CAROTID PTA/STENT INTERVENTION;  Surgeon: Renford Dills, MD;  Location: [facility] INVASIVE CV LAB;  Service: Cardiovascular;  Laterality: Right;   CAROTID PTA/STENT INTERVENTION Left 12/17/2020   Procedure: CAROTID PTA/STENT INTERVENTION;  Surgeon: Renford Dills, MD;  Location: [facility] INVASIVE CV LAB;  Service: Cardiovascular;  Laterality: Left;      No Known Allergies  Current Outpatient Medications  Medication Sig Dispense Refill   acetaminophen (TYLENOL) 500 MG tablet Take 1,000 mg by mouth every 6 (six) hours as needed for mild pain.     aspirin 81 MG EC tablet Take 81 mg by mouth at bedtime.     aspirin EC 81 MG EC tablet Take 1 tablet (81 mg total) by mouth at bedtime. Swallow whole. 30 tablet 11   atorvastatin (LIPITOR) 40 MG tablet Take 40 mg by mouth at bedtime.     clopidogrel (PLAVIX) 75 MG tablet Take 75 mg by mouth at bedtime.     hydrochlorothiazide (HYDRODIURIL) 25 MG tablet Take 25 mg by mouth at bedtime.     levothyroxine (SYNTHROID) 137 MCG tablet Take 137 mcg by mouth at bedtime.     lisinopril (ZESTRIL) 20 MG tablet Take 20 mg by mouth at bedtime.     No current facility-administered medications for this visit.        Physical Exam BP (!) 165/74   Pulse 66   Ht 5\' 5"   (1.651 m)   Wt 181 lb (82.1 kg)   BMI 30.12 kg/m  Gen:  WD/WN, NAD Skin: incision C/D/I; no carotid bruit noted     Assessment/Plan: 1. Carotid stenosis, 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, right Recommend:  The patient is s/p successful right carotid stent.    Duplex ultrasound today shows 40-59% RICA stenosis s/p stent and 1-39% contralateral stenosis.  Continue antiplatelet therapy as prescribed Continue management of CAD, HTN and Hyperlipidemia Healthy heart diet,  encouraged exercise at least 4 times per week  Follow up in 6 months with duplex ultrasound and physical exam based on the patient's carotid surgery and <50% stenosis of the LICA carotid artery        01/22/2021, 9:08 AM   This note was created with Dragon medical transcription system.  Any errors from dictation are unintentional.

## 2021-01-27 ENCOUNTER — Encounter (INDEPENDENT_AMBULATORY_CARE_PROVIDER_SITE_OTHER): Payer: Self-pay | Admitting: Vascular Surgery

## 2021-07-23 ENCOUNTER — Ambulatory Visit (INDEPENDENT_AMBULATORY_CARE_PROVIDER_SITE_OTHER): Payer: Commercial Managed Care - PPO | Admitting: Vascular Surgery

## 2021-07-23 ENCOUNTER — Ambulatory Visit (INDEPENDENT_AMBULATORY_CARE_PROVIDER_SITE_OTHER): Payer: Commercial Managed Care - PPO

## 2021-07-23 DIAGNOSIS — I6521 Occlusion and stenosis of right carotid artery: Secondary | ICD-10-CM

## 2021-07-29 ENCOUNTER — Encounter (INDEPENDENT_AMBULATORY_CARE_PROVIDER_SITE_OTHER): Payer: Self-pay | Admitting: *Deleted

## 2021-11-17 ENCOUNTER — Telehealth (INDEPENDENT_AMBULATORY_CARE_PROVIDER_SITE_OTHER): Payer: Self-pay

## 2021-11-17 NOTE — Telephone Encounter (Signed)
Pt's wife called stating that the pt has not had his Plavix since May and the pharmacy [facility] says that they are faxing request with no answer.  Pt had a procedure done in April with Dr Gilda Crease.  Should the pt start back taking the Plavix at this point? Please advise

## 2021-11-23 NOTE — Telephone Encounter (Signed)
We have he has not taken it since May, we can just evaluate when he comes in for his follow-up

## 2021-11-24 NOTE — Telephone Encounter (Signed)
Lurena Joiner pts wife stated verbal understanding that he will be evaluated at his appt in Oct

## 2022-01-20 ENCOUNTER — Other Ambulatory Visit (INDEPENDENT_AMBULATORY_CARE_PROVIDER_SITE_OTHER): Payer: Self-pay | Admitting: Vascular Surgery

## 2022-01-20 DIAGNOSIS — I6523 Occlusion and stenosis of bilateral carotid arteries: Secondary | ICD-10-CM

## 2022-01-21 ENCOUNTER — Ambulatory Visit (INDEPENDENT_AMBULATORY_CARE_PROVIDER_SITE_OTHER): Payer: Commercial Managed Care - PPO

## 2022-01-21 ENCOUNTER — Ambulatory Visit (INDEPENDENT_AMBULATORY_CARE_PROVIDER_SITE_OTHER): Payer: Commercial Managed Care - PPO | Admitting: Nurse Practitioner

## 2022-01-21 ENCOUNTER — Encounter (INDEPENDENT_AMBULATORY_CARE_PROVIDER_SITE_OTHER): Payer: Self-pay | Admitting: Nurse Practitioner

## 2022-01-21 VITALS — BP 166/69 | HR 60 | Resp 16 | Ht 66.0 in | Wt 174.0 lb

## 2022-01-21 DIAGNOSIS — I6523 Occlusion and stenosis of bilateral carotid arteries: Secondary | ICD-10-CM

## 2022-01-21 DIAGNOSIS — R011 Cardiac murmur, unspecified: Secondary | ICD-10-CM

## 2022-01-21 DIAGNOSIS — I10 Essential (primary) hypertension: Secondary | ICD-10-CM

## 2022-01-24 ENCOUNTER — Encounter (INDEPENDENT_AMBULATORY_CARE_PROVIDER_SITE_OTHER): Payer: Self-pay | Admitting: Nurse Practitioner

## 2022-01-24 NOTE — Progress Notes (Signed)
Subjective:    Patient ID: [name], male    DOB: 24-Dec-1960, 61 y.o.   MRN: [id number] Chief Complaint  Patient presents with   Follow-up    ultrasound    The patient is seen for follow up evaluation of carotid stenosis. The carotid stenosis followed by ultrasound.  The patient has a history of right ICA stent as well as a left common carotid artery stent at the arch.  The patient is taking enteric-coated aspirin 81 mg daily.  There is no history of migraine headaches. There is no history of seizures.  No recent shortening of the patient's walking distance or new symptoms consistent with claudication.  No history of rest pain symptoms. No new ulcers or wounds of the lower extremities have occurred.  There is no history of DVT, PE or superficial thrombophlebitis. No recent episodes of angina or shortness of breath documented.   Carotid Duplex done today shows 1 to 39% stenosis.  Velocities are slightly decreased on the right.    Review of Systems  Cardiovascular:  Negative for chest pain.  All other systems reviewed and are negative.      Objective:   Physical Exam Vitals reviewed.  HENT:     Head: Normocephalic.  Cardiovascular:     Rate and Rhythm: Normal rate and regular rhythm.     Heart sounds: Murmur heard.  Pulmonary:     Effort: Pulmonary effort is normal.  Skin:    General: Skin is warm and dry.  Neurological:     Mental Status: He is alert and oriented to person, place, and time.  Psychiatric:        Mood and Affect: Mood normal.        Behavior: Behavior normal.        Thought Content: Thought content normal.        Judgment: Judgment normal.     BP (!) 166/69 (BP Location: Left Arm)   Pulse 60   Resp 16   Ht 5\' 6"  (1.676 m)   Wt 174 lb (78.9 kg)   BMI 28.08 kg/m   Past Medical History:  Diagnosis Date   Arthritis    Hyperlipidemia    Hypertension    Thyroid disease    hypothyrodism    Social History   Socioeconomic History    Marital status: Married    Spouse name: Not on file   Number of children: Not on file   Years of education: Not on file   Highest education level: Not on file  Occupational History   Not on file  Tobacco Use   Smoking status: Every Day    Packs/day: 1.00    Years: 47.00    Total pack years: 47.00    Types: Cigarettes   Smokeless tobacco: Never  Substance and Sexual Activity   Alcohol use: Never   Drug use: Never   Sexual activity: Not on file  Other Topics Concern   Not on file  Social History Narrative   Not on file   Social Determinants of Health   Financial Resource Strain: Not on file  Food Insecurity: Not on file  Transportation Needs: Not on file  Physical Activity: Not on file  Stress: Not on file  Social Connections: Not on file  Intimate Partner Violence: Not on file    Past Surgical History:  Procedure Laterality Date   BACK SURGERY     CAROTID PTA/STENT INTERVENTION Right 10/22/2020   Procedure: CAROTID PTA/STENT INTERVENTION;  Surgeon:  Schnier, Latina Craver, MD;  Location: [facility] INVASIVE CV LAB;  Service: Cardiovascular;  Laterality: Right;   CAROTID PTA/STENT INTERVENTION Left 12/17/2020   Procedure: CAROTID PTA/STENT INTERVENTION;  Surgeon: Renford Dills, MD;  Location: [facility] INVASIVE CV LAB;  Service: Cardiovascular;  Laterality: Left;    Family History  Problem Relation Age of Onset   Hypothyroidism Mother    Diabetes Mother    Heart attack Mother    Cancer Father    Hypothyroidism Sister    Hypothyroidism Brother     No Known Allergies     Latest Ref Rng & Units 12/18/2020    5:54 AM 10/23/2020    8:16 AM  CBC  WBC 4.0 - 10.5 K/uL 10.0  11.0   Hemoglobin 13.0 - 17.0 g/dL 16.1  09.6   Hematocrit 39.0 - 52.0 % 33.4  35.3   Platelets 150 - 400 K/uL 252  246       CMP     Component Value Date/Time   NA 138 12/18/2020 0554   K 4.3 12/18/2020 0554   CL 111 12/18/2020 0554   CO2 22 12/18/2020 0554   GLUCOSE 114 (H) 12/18/2020 0554    BUN 29 (H) 12/18/2020 0554   CREATININE 1.39 (H) 12/18/2020 0554   CALCIUM 8.9 12/18/2020 0554   GFRNONAA 58 (L) 12/18/2020 0554     No results found.     Assessment & Plan:   1. Bilateral carotid artery stenosis Recommend:   The patient is s/p successful right ICA and left CCA stent    Duplex ultrasound today shows 1 to 39% stenosis bilaterally.   Continue antiplatelet therapy as prescribed Continue management of CAD, HTN and Hyperlipidemia Healthy heart diet,  encouraged exercise at least 4 times per week   Follow up in 12 months  2. Murmur, cardiac The patient has noted worsening murmur, noted by his PCP.  He notes that he had a referral but there were issues with receiving it.  We will attempt to send an additional referral for the patient as he is concerned, which is understandable.  Will refer to cardiology for further evaluation. - Ambulatory referral to Cardiology  3. Essential hypertension Continue antihypertensive medications as already ordered, these medications have been reviewed and there are no changes at this time.    Current Outpatient Medications on File Prior to Visit  Medication Sig Dispense Refill   acetaminophen (TYLENOL) 500 MG tablet Take 1,000 mg by mouth every 6 (six) hours as needed for mild pain.     aspirin 81 MG EC tablet Take 81 mg by mouth at bedtime.     aspirin EC 81 MG EC tablet Take 1 tablet (81 mg total) by mouth at bedtime. Swallow whole. 30 tablet 11   atorvastatin (LIPITOR) 40 MG tablet Take 40 mg by mouth at bedtime.     hydrochlorothiazide (HYDRODIURIL) 25 MG tablet Take 25 mg by mouth at bedtime.     levothyroxine (SYNTHROID) 137 MCG tablet Take 137 mcg by mouth at bedtime.     lisinopril (ZESTRIL) 20 MG tablet Take 20 mg by mouth at bedtime.     clopidogrel (PLAVIX) 75 MG tablet Take 75 mg by mouth at bedtime.     No current facility-administered medications on file prior to visit.    There are no Patient Instructions on file  for this visit. No follow-ups on file.   Georgiana Spinner, NP

## 2022-02-08 ENCOUNTER — Encounter (INDEPENDENT_AMBULATORY_CARE_PROVIDER_SITE_OTHER): Payer: Self-pay

## 2022-08-12 ENCOUNTER — Encounter: Payer: Self-pay | Admitting: Ophthalmology

## 2022-08-12 NOTE — Anesthesia Preprocedure Evaluation (Addendum)
Anesthesia Evaluation  Patient identified by MRN, date of birth, ID band Patient awake    Reviewed: Allergy & Precautions, H&P , NPO status , Patient's Chart, lab work & pertinent test results  Airway Mallampati: I  TM Distance: >3 FB Neck ROM: Full    Dental no notable dental hx.    Pulmonary Current Smoker and Patient abstained from smoking. 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, resolved, subsequently had carotid stents, no further problems TIA negative psych ROS   GI/Hepatic Neg liver ROS,GERD  ,,Hx barretts esophagus    Endo/Other  negative endocrine ROS    Renal/GU negative Renal ROS  negative genitourinary   Musculoskeletal negative musculoskeletal ROS (+) Arthritis , Osteoarthritis,    Abdominal   Peds negative pediatric ROS (+)  Hematology negative hematology ROS (+)   Anesthesia Other Findings Hypertension  Hyperlipidemia Arthritis  Thyroid disease Coronary artery disease  S/P bilateral carotid artery stents Hx right eye ophthalmic stroke/TIA, resolved Sept 2022 and subsequently had carotid stents  Reproductive/Obstetrics negative OB ROS                             Anesthesia Physical Anesthesia Plan  ASA: 3  Anesthesia Plan: MAC   Post-op Pain Management:    Induction: Intravenous  PONV Risk Score and Plan:   Airway Management Planned: Natural Airway and Nasal Cannula  Additional Equipment:   Intra-op Plan:   Post-operative Plan:   Informed Consent: I have reviewed the patients History and Physical, chart, labs and discussed the procedure including the risks, benefits and alternatives for the proposed anesthesia with the  patient or authorized representative who has indicated his/her understanding and acceptance.     Dental Advisory Given  Plan Discussed with: Anesthesiologist, CRNA and Surgeon  Anesthesia Plan Comments: (Patient consented for risks of anesthesia including but not limited to:  - adverse reactions to medications - damage to eyes, teeth, lips or other oral mucosa - nerve damage due to positioning  - sore throat or hoarseness - Damage to heart, brain, nerves, lungs, other parts of body or loss of life  Patient voiced understanding.)       Anesthesia Quick Evaluation

## 2022-08-16 NOTE — Discharge Instructions (Signed)

## 2022-08-18 ENCOUNTER — Encounter
Admission: RE | Disposition: A | Discharge status: Home / Self Care | Payer: Self-pay | Source: Home / Self Care | Attending: Ophthalmology

## 2022-08-18 ENCOUNTER — Other Ambulatory Visit: Payer: Self-pay

## 2022-08-18 ENCOUNTER — Ambulatory Visit: Payer: Commercial Managed Care - PPO | Admitting: Anesthesiology

## 2022-08-18 ENCOUNTER — Encounter: Payer: Self-pay | Admitting: Ophthalmology

## 2022-08-18 ENCOUNTER — Ambulatory Visit
Admission: RE | Admit: 2022-08-18 | Discharge: 2022-08-18 | Disposition: A | Discharge status: Home / Self Care | Payer: Commercial Managed Care - PPO | Attending: Ophthalmology | Admitting: Ophthalmology

## 2022-08-18 DIAGNOSIS — I2510 Atherosclerotic heart disease of native coronary artery without angina pectoris: Secondary | ICD-10-CM | POA: Insufficient documentation

## 2022-08-18 DIAGNOSIS — F17210 Nicotine dependence, cigarettes, uncomplicated: Secondary | ICD-10-CM | POA: Insufficient documentation

## 2022-08-18 DIAGNOSIS — Z8673 Personal history of transient ischemic attack (TIA), and cerebral infarction without residual deficits: Secondary | ICD-10-CM | POA: Diagnosis not present

## 2022-08-18 DIAGNOSIS — I10 Essential (primary) hypertension: Secondary | ICD-10-CM | POA: Insufficient documentation

## 2022-08-18 DIAGNOSIS — H2511 Age-related nuclear cataract, right eye: Secondary | ICD-10-CM | POA: Diagnosis present

## 2022-08-18 DIAGNOSIS — I739 Peripheral vascular disease, unspecified: Secondary | ICD-10-CM | POA: Insufficient documentation

## 2022-08-18 HISTORY — DX: Atherosclerotic heart disease of native coronary artery without angina pectoris: I25.10

## 2022-08-18 HISTORY — DX: Personal history of transient ischemic attack (TIA), and cerebral infarction without residual deficits: Z86.73

## 2022-08-18 HISTORY — PX: CATARACT EXTRACTION W/PHACO: SHX586

## 2022-08-18 SURGERY — PHACOEMULSIFICATION, CATARACT, WITH IOL INSERTION
Anesthesia: Monitor Anesthesia Care | Laterality: Right

## 2022-08-18 MED ORDER — SIGHTPATH DOSE#1 BSS IO SOLN
INTRAOCULAR | Status: DC | PRN
  Administered 2022-08-18: 15 mL via INTRAOCULAR

## 2022-08-18 MED ORDER — SIGHTPATH DOSE#1 BSS IO SOLN
INTRAOCULAR | Status: DC | PRN
  Administered 2022-08-18: 2 mL

## 2022-08-18 MED ORDER — MIDAZOLAM HCL 2 MG/2ML IJ SOLN
INTRAMUSCULAR | Status: DC | PRN
  Administered 2022-08-18: 2 mg via INTRAVENOUS

## 2022-08-18 MED ORDER — SIGHTPATH DOSE#1 NA HYALUR & NA CHOND-NA HYALUR IO KIT
PACK | INTRAOCULAR | Status: DC | PRN
  Administered 2022-08-18: 1 via OPHTHALMIC

## 2022-08-18 MED ORDER — TETRACAINE HCL 0.5 % OP SOLN
1.0000 [drp] | OPHTHALMIC | Status: DC | PRN
  Administered 2022-08-18 (×3): 1 [drp] via OPHTHALMIC

## 2022-08-18 MED ORDER — ARMC OPHTHALMIC DILATING DROPS
1.0000 | OPHTHALMIC | Status: DC | PRN
  Administered 2022-08-18 (×3): 1 via OPHTHALMIC

## 2022-08-18 MED ORDER — CEFUROXIME OPHTHALMIC INJECTION 1 MG/0.1 ML
INJECTION | OPHTHALMIC | Status: DC | PRN
  Administered 2022-08-18: 1 mg via INTRACAMERAL

## 2022-08-18 MED ORDER — FENTANYL CITRATE (PF) 100 MCG/2ML IJ SOLN
INTRAMUSCULAR | Status: DC | PRN
  Administered 2022-08-18 (×2): 50 ug via INTRAVENOUS

## 2022-08-18 MED ORDER — LACTATED RINGERS IV SOLN: INTRAVENOUS | Status: DC

## 2022-08-18 MED ORDER — BRIMONIDINE TARTRATE-TIMOLOL 0.2-0.5 % OP SOLN
OPHTHALMIC | Status: DC | PRN
  Administered 2022-08-18: 1 [drp] via OPHTHALMIC

## 2022-08-18 MED ORDER — SIGHTPATH DOSE#1 BSS IO SOLN
INTRAOCULAR | Status: DC | PRN
  Administered 2022-08-18: 41 mL via OPHTHALMIC

## 2022-08-18 SURGICAL SUPPLY — 10 items
CATARACT SUITE SIGHTPATH (MISCELLANEOUS) ×1 IMPLANT
FEE CATARACT SUITE SIGHTPATH (MISCELLANEOUS) ×1 IMPLANT
GLOVE SRG 8 PF TXTR STRL LF DI (GLOVE) ×1 IMPLANT
GLOVE SURG ENC TEXT LTX SZ7.5 (GLOVE) ×1 IMPLANT
GLOVE SURG UNDER POLY LF SZ8 (GLOVE) ×1
LENS IOL TECNIS EYHANCE 22.0 (Intraocular Lens) IMPLANT
NDL FILTER BLUNT 18X1 1/2 (NEEDLE) ×1 IMPLANT
NEEDLE FILTER BLUNT 18X1 1/2 (NEEDLE) ×1 IMPLANT
RING MALYGIN 7.0 (MISCELLANEOUS) IMPLANT
SYR 3ML LL SCALE MARK (SYRINGE) ×1 IMPLANT

## 2022-08-18 NOTE — Op Note (Signed)
LOCATION:  [facility]   PREOPERATIVE DIAGNOSIS:    Nuclear sclerotic cataract right eye. H25.11   POSTOPERATIVE DIAGNOSIS:  Nuclear sclerotic cataract right eye.     PROCEDURE:  Phacoemusification with posterior chamber intraocular lens placement of the right eye   ULTRASOUND TIME: Procedure(s): CATARACT EXTRACTION PHACO AND INTRAOCULAR LENS PLACEMENT (IOC) RIGHT MALYUGIN 4.19 00:24.5 (Right)  LENS:   Implant Name Type Inv. Item Serial No. Manufacturer Lot No. LRB No. Used Action  LENS IOL TECNIS EYHANCE 22.0 - [id number] Intraocular Lens LENS IOL TECNIS EYHANCE 22.0  [employer]  Right 1 Implanted         SURGEON:  Deirdre Evener, MD   ANESTHESIA:  Topical with tetracaine drops and 2% Xylocaine jelly, augmented with 1% preservative-free intracameral lidocaine.    COMPLICATIONS:  None.   DESCRIPTION OF PROCEDURE:  The patient was identified in the holding room and transported to the operating room and placed in the supine position under the operating microscope.  The right eye was identified as the operative eye and it was prepped and draped in the usual sterile ophthalmic fashion.   A 1 millimeter clear-corneal paracentesis was made at the 12:00 position.  0.5 ml of preservative-free 1% lidocaine was injected into the anterior chamber. The anterior chamber was filled with Viscoat viscoelastic.  A 2.4 millimeter keratome was used to make a near-clear corneal incision at the 9:00 position.  A curvilinear capsulorrhexis was made with a cystotome and capsulorrhexis forceps.  Balanced salt solution was used to hydrodissect and hydrodelineate the nucleus.   Phacoemulsification was then used in stop and chop fashion to remove the lens nucleus and epinucleus.  The remaining cortex was then removed using the irrigation and aspiration handpiece. Provisc was then placed into the capsular bag to distend it for lens placement.  A lens was then injected into the capsular bag.  The  remaining viscoelastic was aspirated.   Wounds were hydrated with balanced salt solution.  The anterior chamber was inflated to a physiologic pressure with balanced salt solution.  No wound leaks were noted. Cefuroxime 0.1 ml of a 10mg /ml solution was injected into the anterior chamber for a dose of 1 mg of intracameral antibiotic at the completion of the case.   Timolol and Brimonidine drops were applied to the eye.  The patient was taken to the recovery room in stable condition without complications of anesthesia or surgery.   [name] 08/18/2022, 10:09 AM

## 2022-08-18 NOTE — Anesthesia Postprocedure Evaluation (Signed)
Anesthesia Post Note  Patient: [name]  Procedure(s) Performed: CATARACT EXTRACTION PHACO AND INTRAOCULAR LENS PLACEMENT (IOC) RIGHT MALYUGIN 4.19 00:24.5 (Right)  Patient location during evaluation: PACU Anesthesia Type: MAC Level of consciousness: awake and alert Pain management: pain level controlled Vital Signs Assessment: post-procedure vital signs reviewed and stable Respiratory status: spontaneous breathing, nonlabored ventilation, respiratory function stable and patient connected to nasal cannula oxygen Cardiovascular status: stable and blood pressure returned to baseline Postop Assessment: no apparent nausea or vomiting Anesthetic complications: no   No notable events documented.   Last Vitals:  Vitals:   08/18/22 1012 08/18/22 1016  BP: 124/60 (!) 133/56  Pulse: 71 70  Resp: 18 15  Temp: 36.5 C (!) 36.4 C  SpO2: 96% 95%    Last Pain:  Vitals:   08/18/22 1016  TempSrc:   PainSc: 0-No pain                 Taima Rada C Julias Mould

## 2022-08-18 NOTE — H&P (Signed)
[facility]   Primary Care Physician:  Lorn Junes, FNP Ophthalmologist: Dr. Lockie Mola  Pre-Procedure History & Physical: HPI:  [name] is a 62 y.o. male here for ophthalmic surgery.   Past Medical History:  Diagnosis Date   Arthritis    Coronary artery disease    Hyperlipidemia    Hypertension    Thyroid disease    hypothyrodism    Past Surgical History:  Procedure Laterality Date   BACK SURGERY     CAROTID PTA/STENT INTERVENTION Right 10/22/2020   Procedure: CAROTID PTA/STENT INTERVENTION;  Surgeon: Renford Dills, MD;  Location: [facility] INVASIVE CV LAB;  Service: Cardiovascular;  Laterality: Right;   CAROTID PTA/STENT INTERVENTION Left 12/17/2020   Procedure: CAROTID PTA/STENT INTERVENTION;  Surgeon: Renford Dills, MD;  Location: [facility] INVASIVE CV LAB;  Service: Cardiovascular;  Laterality: Left;    Prior to Admission medications   Medication Sig Start Date End Date Taking? Authorizing Provider  acetaminophen (TYLENOL) 500 MG tablet Take 1,000 mg by mouth every 6 (six) hours as needed for mild pain. 11/12/20  Yes [provider]  aspirin 81 MG EC tablet Take 81 mg by mouth at bedtime.   Yes [provider]  aspirin EC 81 MG EC tablet Take 1 tablet (81 mg total) by mouth at bedtime. Swallow whole. 12/18/20  Yes Schnier, Latina Craver, MD  atorvastatin (LIPITOR) 40 MG tablet Take 40 mg by mouth at bedtime. 08/07/20  Yes [provider]  hydrochlorothiazide (HYDRODIURIL) 25 MG tablet Take 25 mg by mouth at bedtime. 07/31/20  Yes [provider]  levothyroxine (SYNTHROID) 137 MCG tablet Take 137 mcg by mouth at bedtime.   Yes [provider]  lisinopril (ZESTRIL) 20 MG tablet Take 20 mg by mouth at bedtime. 12/09/20  Yes [provider]  clopidogrel (PLAVIX) 75 MG tablet Take 75 mg by mouth at bedtime. Patient not taking: Reported on 08/12/2022    [provider]    Allergies as of 08/09/2022    (No Known Allergies)    Family History  Problem Relation Age of Onset   Hypothyroidism Mother    Diabetes Mother    Heart attack Mother    Cancer Father    Hypothyroidism Sister    Hypothyroidism Brother     Social History   Socioeconomic History   Marital status: Married    Spouse name: Not on file   Number of children: Not on file   Years of education: Not on file   Highest education level: Not on file  Occupational History   Not on file  Tobacco Use   Smoking status: Every Day    Packs/day: 1.00    Years: 47.00    Additional pack years: 0.00    Total pack years: 47.00    Types: Cigarettes   Smokeless tobacco: Never  Substance and Sexual Activity   Alcohol use: Never   Drug use: Never   Sexual activity: Not on file  Other Topics Concern   Not on file  Social History Narrative   Not on file   Social Determinants of Health   Financial Resource Strain: Not on file  Food Insecurity: Not on file  Transportation Needs: Not on file  Physical Activity: Not on file  Stress: Not on file  Social Connections: Not on file  Intimate Partner Violence: Not on file    Review of Systems: See HPI, otherwise negative ROS  Physical Exam: BP (!) 171/64   Ht 5'  5.98" (1.676 m)   Wt 79.7 kg   SpO2 100%   BMI 28.37 kg/m  General:   Alert,  pleasant and cooperative in NAD Head:  Normocephalic and atraumatic. Lungs:  Clear to auscultation.    Heart:  Regular rate and rhythm.   Impression/Plan: [name] is here for ophthalmic surgery.  Risks, benefits, limitations, and alternatives regarding ophthalmic surgery have been reviewed with the patient.  Questions have been answered.  All parties agreeable.   Lockie Mola, MD  08/18/2022, 8:57 AM

## 2022-08-18 NOTE — Transfer of Care (Signed)
Immediate Anesthesia Transfer of Care Note  Patient: [name]  Procedure(s) Performed: CATARACT EXTRACTION PHACO AND INTRAOCULAR LENS PLACEMENT (IOC) RIGHT MALYUGIN 4.19 00:24.5 (Right)  Patient Location: PACU  Anesthesia Type: MAC  Level of Consciousness: awake, alert  and patient cooperative  Airway and Oxygen Therapy: Patient Spontanous Breathing and Patient connected to supplemental oxygen  Post-op Assessment: Post-op Vital signs reviewed, Patient's Cardiovascular Status Stable, Respiratory Function Stable, Patent Airway and No signs of Nausea or vomiting  Post-op Vital Signs: Reviewed and stable  Complications: No notable events documented.

## 2022-08-19 ENCOUNTER — Encounter: Payer: Self-pay | Admitting: Ophthalmology

## 2022-12-04 IMAGING — US US CAROTID DUPLEX BILAT
1 series · 13 of 24 positions shown · non-contrast
Comparison: None.

CLINICAL DATA: 60-year-old male with a history of amaurosis fugax

EXAM:
BILATERAL CAROTID DUPLEX ULTRASOUND
TECHNIQUE: Gray scale imaging, color Doppler and duplex ultrasound were
performed of bilateral carotid and vertebral arteries in the neck.

[Series 1: us carotid duplex bilat · 0.06mm/px · 13 of 67 slices shown]
[im 1/67]
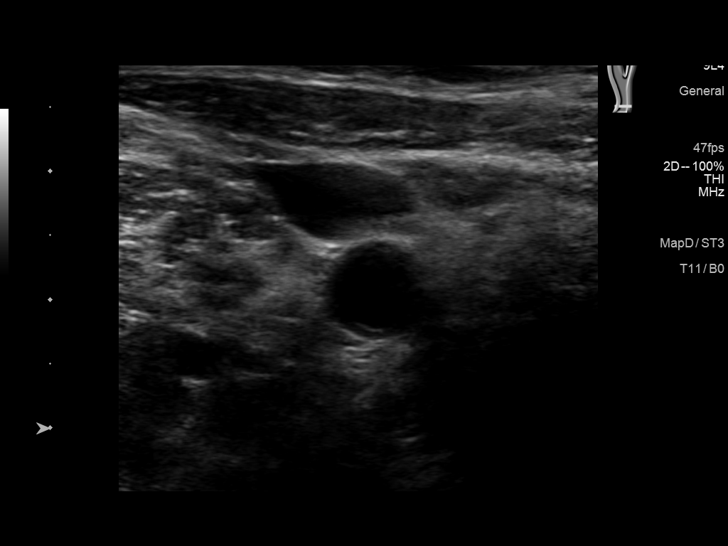
[im 6/67]
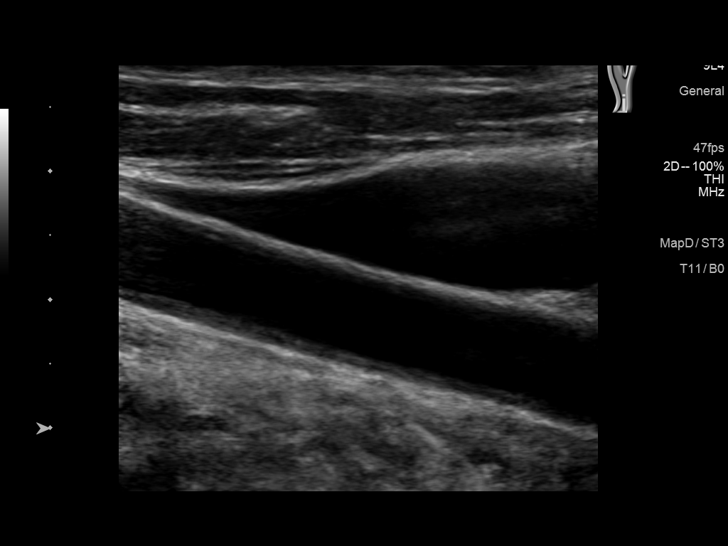
[im 12/67]
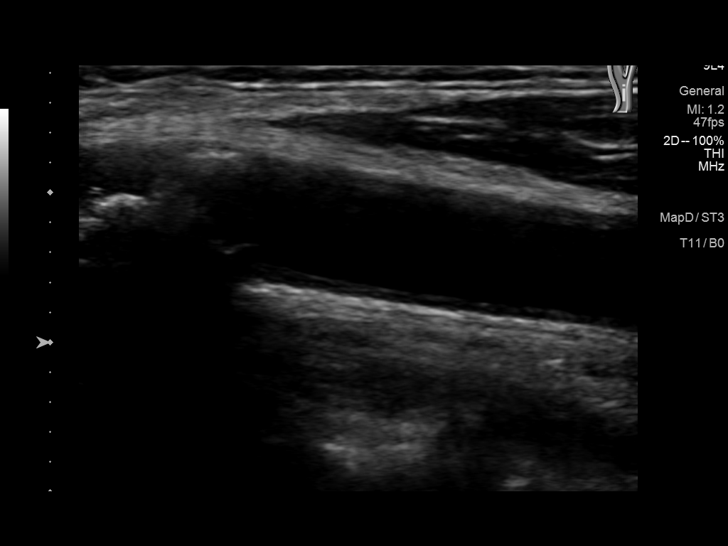
[im 18/67]
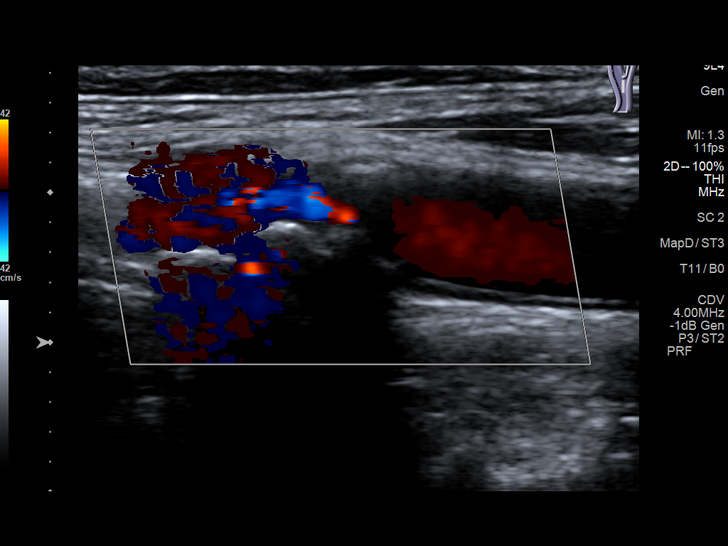
[im 23/67]
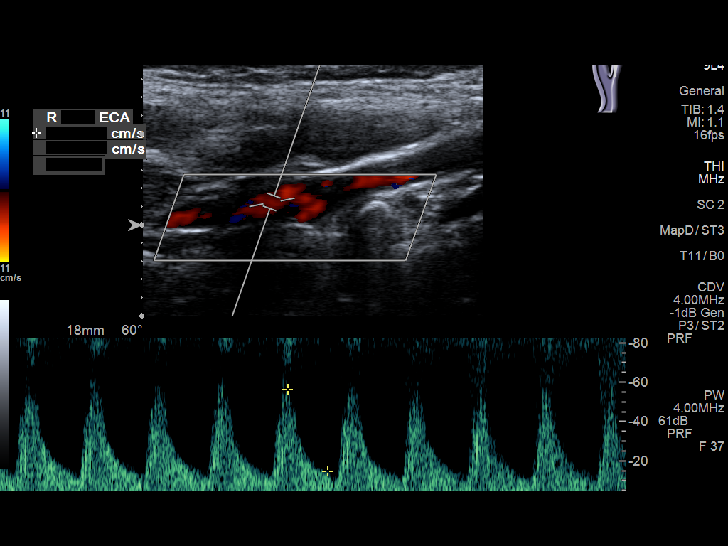
[im 29/67]
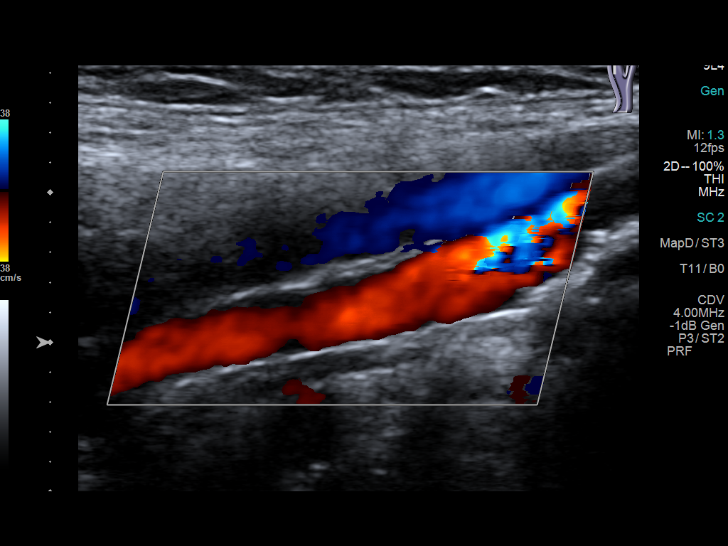
[im 35/67]
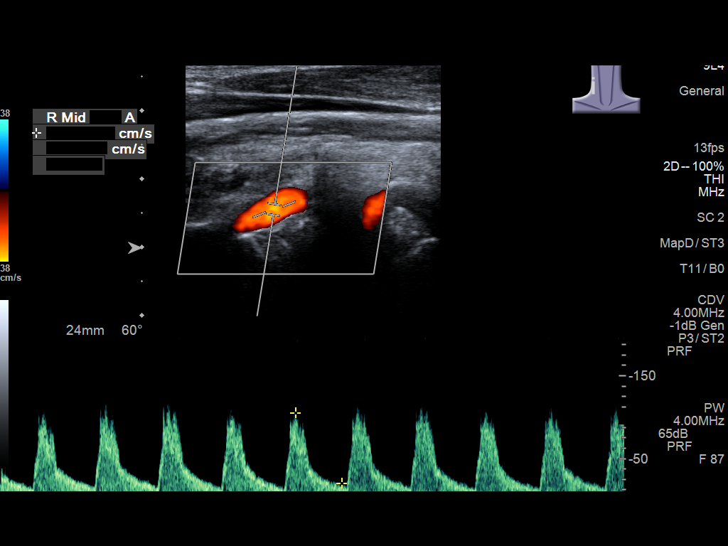
[im 38/67]
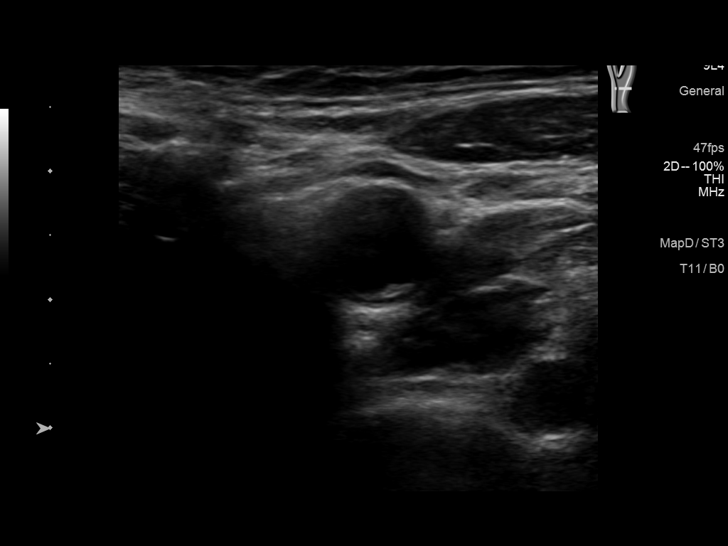
[im 44/67]
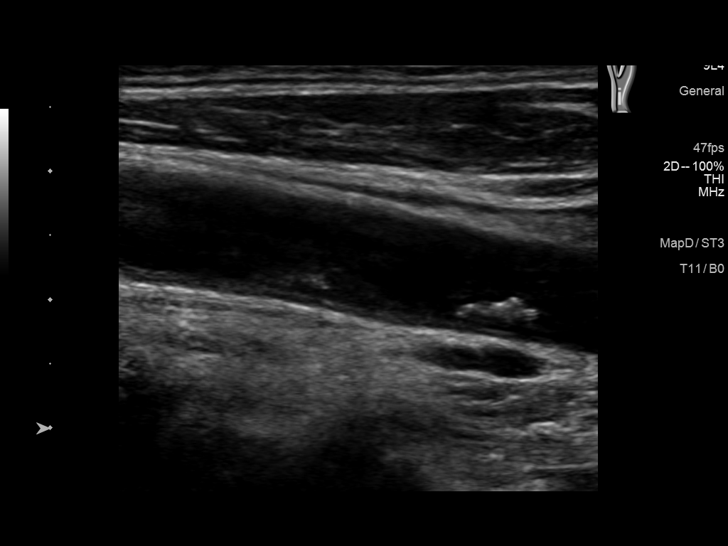
[im 49/67]
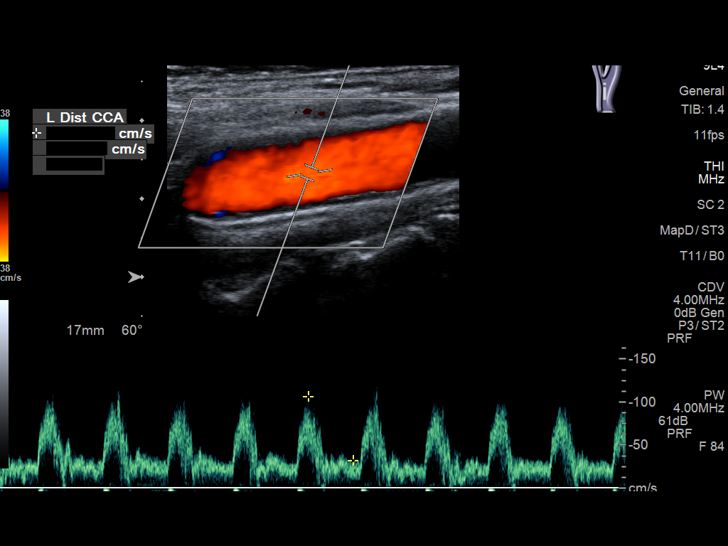
[im 55/67]
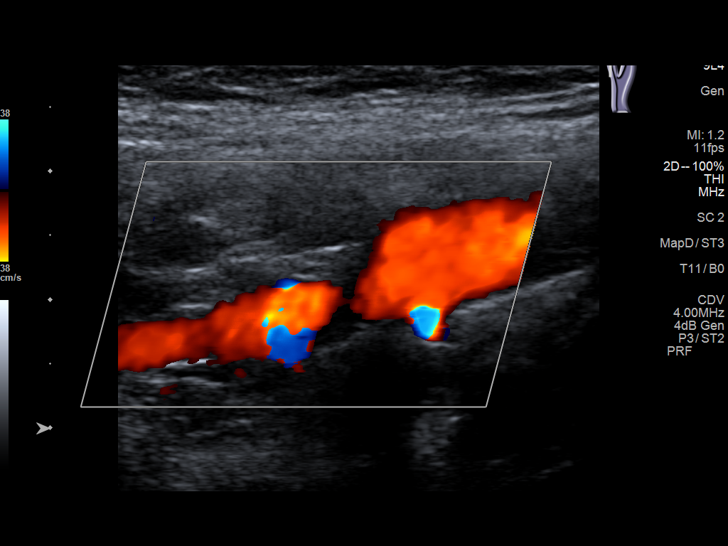
[im 61/67]
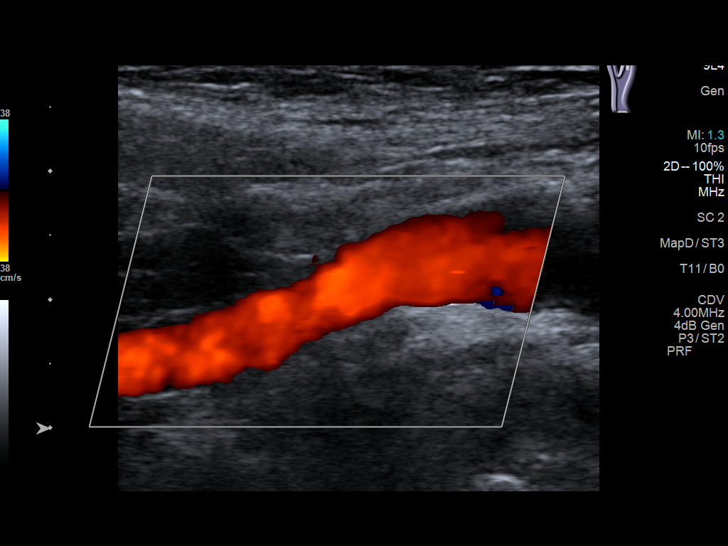
[im 67/67]
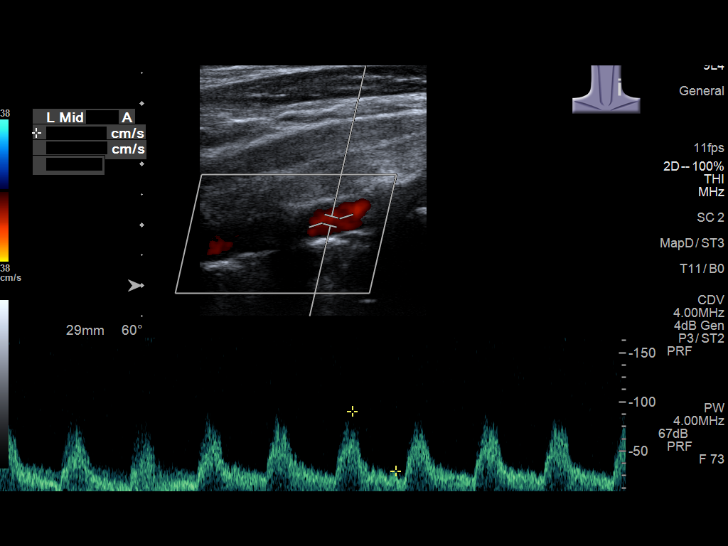

[13 of 24 positions shown; findings below may reference images not displayed]

FINDINGS: Criteria: Quantification of carotid stenosis is based on velocity
parameters that correlate the residual internal carotid diameter
with NASCET-based stenosis levels, using the diameter of the distal
internal carotid lumen as the denominator for stenosis measurement.

The following velocity measurements were obtained:

RIGHT

ICA:  Systolic 484 cm/sec, Diastolic 105 cm/sec

CCA:  33 cm/sec

SYSTOLIC ICA/CCA RATIO:

ECA:  26 cm/sec

LEFT

ICA:  Systolic 142 cm/sec, Diastolic 54 cm/sec

CCA:  107 cm/sec

SYSTOLIC ICA/CCA RATIO:

ECA:  131 cm/sec

Right Brachial SBP: Not acquired

Left Brachial SBP: Not acquired

RIGHT CAROTID ARTERY: No significant calcifications of the right
common carotid artery. Intermediate waveform maintained. Moderate
heterogeneous and partially calcified plaque at the right carotid
bifurcation. No significant lumen shadowing. Low resistance waveform
of the right ICA. No significant tortuosity.

RIGHT VERTEBRAL ARTERY: Antegrade flow with low resistance waveform.

LEFT CAROTID ARTERY: No significant calcifications of the left
common carotid artery. Intermediate waveform maintained. Moderate
heterogeneous and partially calcified plaque at the left carotid
bifurcation. No significant lumen shadowing. Low resistance waveform
of the left ICA. No significant tortuosity.

LEFT VERTEBRAL ARTERY:  Antegrade flow with low resistance waveform.
IMPRESSION: Right:

Heterogeneous and partially calcified plaque at the right carotid
bifurcation contributes to 70%-99% stenosis by established duplex
criteria.

Left:

Color duplex indicates moderate heterogeneous and calcified plaque,
with no hemodynamically significant stenosis by duplex criteria in
the extracranial cerebrovascular circulation.

## 2023-01-19 ENCOUNTER — Other Ambulatory Visit (INDEPENDENT_AMBULATORY_CARE_PROVIDER_SITE_OTHER): Payer: Self-pay | Admitting: Nurse Practitioner

## 2023-01-19 DIAGNOSIS — I6523 Occlusion and stenosis of bilateral carotid arteries: Secondary | ICD-10-CM

## 2023-01-23 NOTE — Progress Notes (Unsigned)
MRN : [id number]  [name] is a 62 y.o. (06-13-1960) male who presents with chief complaint of check carotid arteries.  History of Present Illness:   The patient is seen for follow up evaluation of carotid stenosis. The carotid stenosis followed by ultrasound.  The patient has a history of right ICA stent as well as a left common carotid artery stent at the arch.  Procedure 12/18/2022 :  1.   Placement of a 8 mm x 26 mm lifestream stent with the use of the NAV-6 embolic protection device in the left common carotid artery   Procedure 10/22/2020:  1.   Placement of a 10 mm x 8 mm x 30 mm exact stent with the use of the NAV-6 embolic protection device in the right internal carotid artery      The patient is taking enteric-coated aspirin 81 mg and Plavix 75 mg daily.   There is no history of migraine headaches. There is no history of seizures.   No recent shortening of the patient's walking distance or new symptoms consistent with claudication.  No history of rest pain symptoms. No new ulcers or wounds of the lower extremities have occurred.  The patient does note that he has been having episodes of chest discomfort associated with shortness of breath.  There have been several.  At this point they are limited in duration.   There is no history of DVT, PE or superficial thrombophlebitis.   Duplex ultrasound today shows  Right carotid  40-59% and LICA 1 to 39% stenosis.  No outpatient medications have been marked as taking for the 01/24/23 encounter (Appointment) with Gilda Crease, Latina Craver, MD.    Past Medical History:  Diagnosis Date   Arthritis    Coronary artery disease    Hx of transient ischemic attack (TIA)    Hyperlipidemia    Hypertension    Thyroid disease    hypothyrodism    Past Surgical History:  Procedure Laterality Date   BACK SURGERY     CAROTID PTA/STENT INTERVENTION Right 10/22/2020   Procedure: CAROTID PTA/STENT INTERVENTION;   Surgeon: Renford Dills, MD;  Location: [facility];  Service: Cardiovascular;  Laterality: Right;   CAROTID PTA/STENT INTERVENTION Left 12/17/2020   Procedure: CAROTID PTA/STENT INTERVENTION;  Surgeon: Renford Dills, MD;  Location: [facility] INVASIVE CV [facility];  Service: Cardiovascular;  Laterality: Left;   CATARACT EXTRACTION W/PHACO Right 08/18/2022   Procedure: CATARACT EXTRACTION PHACO AND INTRAOCULAR LENS PLACEMENT (IOC) RIGHT MALYUGIN 4.19 00:24.5;  Surgeon: Lockie Mola, MD;  Location: [facility];  Service: Ophthalmology;  Laterality: Right;    Social History Social History   Tobacco Use   Smoking status: Every Day    Current packs/day: 1.00    Average packs/day: 1 pack/day for 47.0 years (47.0 ttl pk-yrs)    Types: Cigarettes   Smokeless tobacco: Never  Substance Use Topics   Alcohol use: Never   Drug use: Never    Family History Family History  Problem Relation Age of Onset   Hypothyroidism Mother    Diabetes Mother    Heart attack Mother    Cancer Father    Hypothyroidism Sister    Hypothyroidism Brother     No Known Allergies   REVIEW OF SYSTEMS (Negative unless checked)  Constitutional: [] Weight loss  [] Fever  [] Chills Cardiac: [] Chest pain   []   Chest pressure   [] Palpitations   [] Shortness of breath when laying flat   [] Shortness of breath with exertion. Vascular:  [x] Pain in legs with walking   [] Pain in legs at rest  [] History of DVT   [] Phlebitis   [] Swelling in legs   [] Varicose veins   [] Non-healing ulcers Pulmonary:   [] Uses home oxygen   [] Productive cough   [] Hemoptysis   [] Wheeze  [] COPD   [] Asthma Neurologic:  [] Dizziness   [] Seizures   [] History of stroke   [] History of TIA  [] Aphasia   [] Vissual changes   [] Weakness or numbness in arm   [] Weakness or numbness in leg Musculoskeletal:   [] Joint swelling   [] Joint pain   [] Low back pain Hematologic:  [] Easy bruising  [] Easy bleeding   [] Hypercoagulable state    [] Anemic Gastrointestinal:  [] Diarrhea   [] Vomiting  [] Gastroesophageal reflux/heartburn   [] Difficulty swallowing. Genitourinary:  [] Chronic kidney disease   [] Difficult urination  [] Frequent urination   [] Blood in urine Skin:  [] Rashes   [] Ulcers  Psychological:  [] History of anxiety   []  History of major depression.  Physical Examination  There were no vitals filed for this visit. There is no height or weight on file to calculate BMI. Gen: WD/WN, NAD Head: [place]/AT, No temporalis wasting.  Ear/Nose/Throat: Hearing grossly intact, nares w/o erythema or drainage Eyes: PER, EOMI, sclera nonicteric.  Neck: Supple, no masses.  No bruit or JVD.  Pulmonary:  Good air movement, no audible wheezing, no use of accessory muscles.  Cardiac: RRR, normal S1, S2, no Murmurs. Vascular:  carotid bruit noted Vessel Right Left  Radial Palpable Palpable  Carotid  Palpable  Palpable  PT  Not Palpable Not Palpable  DP Not Palpable Not Palpable  Gastrointestinal: soft, non-distended. No guarding/no peritoneal signs.  Musculoskeletal: M/S 5/5 throughout.  No visible deformity.  Neurologic: CN 2-12 intact. Pain and light touch intact in extremities.  Symmetrical.  Speech is fluent. Motor exam as listed above. Psychiatric: Judgment intact, Mood & affect appropriate for pt's clinical situation. Dermatologic: No rashes or ulcers noted.  No changes consistent with cellulitis.   CBC Lab Results  Component Value Date   WBC 10.0 12/18/2020   HGB 11.6 (L) 12/18/2020   HCT 33.4 (L) 12/18/2020   MCV 94.1 12/18/2020   PLT 252 12/18/2020    BMET    Component Value Date/Time   NA 138 12/18/2020 0554   K 4.3 12/18/2020 0554   CL 111 12/18/2020 0554   CO2 22 12/18/2020 0554   GLUCOSE 114 (H) 12/18/2020 0554   BUN 29 (H) 12/18/2020 0554   CREATININE 1.39 (H) 12/18/2020 0554   CALCIUM 8.9 12/18/2020 0554   GFRNONAA 58 (L) 12/18/2020 0554   CrCl cannot be calculated (Patient's most recent lab result is  older than the maximum 21 days allowed.).  COAG No results found for: "INR", "PROTIME"  Radiology No results found.   Assessment/Plan 1. 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   Continue antiplatelet therapy as prescribed Continue management of CAD, HTN and Hyperlipidemia Healthy heart diet,  encouraged exercise at least 4 times per week.  - VAS US CAROTID; Future  2. Atherosclerotic peripheral vascular disease with intermittent claudication (HCC)  Recommend:  The patient has evidence of atherosclerosis of the lower extremities with claudication.  The  patient does not voice lifestyle limiting changes at this point in time.  Noninvasive studies do not suggest clinically significant change.  No invasive studies, angiography or surgery at this time The patient should continue walking and begin a more formal exercise program.  The patient should continue antiplatelet therapy and aggressive treatment of the lipid abnormalities  No changes in the patient's medications at this time  Continued surveillance is indicated as atherosclerosis is likely to progress with time.    The patient will continue follow up with noninvasive studies as ordered.  - VAS [place] ABI WITH/WO TBI; Future  3. Essential hypertension Continue antihypertensive medications as already ordered, these medications have been reviewed and there are no changes at this time.  4. Hyperlipidemia, mixed Continue statin as ordered and reviewed, no changes at this time  5. Angina at rest [facility]) Continue cardiac and antihypertensive medications as already ordered and reviewed, no changes at this time.  I will contact cardiology to see if we can expedite his follow-up.  Continue statin as ordered and reviewed, no changes at this time  Nitrates PRN for  chest pain.    [name], MD  01/23/2023 4:40 PM

## 2023-01-24 ENCOUNTER — Ambulatory Visit (INDEPENDENT_AMBULATORY_CARE_PROVIDER_SITE_OTHER): Payer: Commercial Managed Care - PPO | Admitting: Vascular Surgery

## 2023-01-24 ENCOUNTER — Ambulatory Visit (INDEPENDENT_AMBULATORY_CARE_PROVIDER_SITE_OTHER): Payer: Commercial Managed Care - PPO

## 2023-01-24 ENCOUNTER — Encounter (INDEPENDENT_AMBULATORY_CARE_PROVIDER_SITE_OTHER): Payer: Self-pay | Admitting: Vascular Surgery

## 2023-01-24 VITALS — BP 182/70 | HR 63 | Resp 18 | Ht 66.0 in | Wt 177.2 lb

## 2023-01-24 DIAGNOSIS — E782 Mixed hyperlipidemia: Secondary | ICD-10-CM

## 2023-01-24 DIAGNOSIS — I10 Essential (primary) hypertension: Secondary | ICD-10-CM | POA: Diagnosis not present

## 2023-01-24 DIAGNOSIS — I70219 Atherosclerosis of native arteries of extremities with intermittent claudication, unspecified extremity: Secondary | ICD-10-CM | POA: Diagnosis not present

## 2023-01-24 DIAGNOSIS — I6523 Occlusion and stenosis of bilateral carotid arteries: Secondary | ICD-10-CM

## 2023-01-24 DIAGNOSIS — I2089 Other forms of angina pectoris: Secondary | ICD-10-CM | POA: Insufficient documentation

## 2023-01-24 DIAGNOSIS — I200 Unstable angina: Secondary | ICD-10-CM | POA: Insufficient documentation

## 2023-02-25 ENCOUNTER — Encounter: Payer: Self-pay | Admitting: Cardiology

## 2023-02-25 ENCOUNTER — Ambulatory Visit: Payer: Commercial Managed Care - PPO | Attending: Cardiology | Admitting: Cardiology

## 2023-02-25 VITALS — BP 148/62 | HR 72 | Ht 66.0 in | Wt 175.6 lb

## 2023-02-25 DIAGNOSIS — I10 Essential (primary) hypertension: Secondary | ICD-10-CM | POA: Diagnosis not present

## 2023-02-25 DIAGNOSIS — R072 Precordial pain: Secondary | ICD-10-CM

## 2023-02-25 DIAGNOSIS — F17200 Nicotine dependence, unspecified, uncomplicated: Secondary | ICD-10-CM

## 2023-02-25 DIAGNOSIS — I2510 Atherosclerotic heart disease of native coronary artery without angina pectoris: Secondary | ICD-10-CM

## 2023-02-25 MED ORDER — METOPROLOL TARTRATE 100 MG PO TABS
100.0000 mg | ORAL_TABLET | Freq: Once | ORAL | 0 refills | Status: DC
Start: 2023-02-25 — End: 2023-03-30

## 2023-02-25 MED ORDER — NITROGLYCERIN 0.4 MG SL SUBL: 0.4000 mg | SUBLINGUAL_TABLET | SUBLINGUAL | 0 refills | Status: DC | PRN

## 2023-02-25 NOTE — Progress Notes (Signed)
Cardiology Office Note:    Date:  02/25/2023   ID:  [name], DOB 1960-05-03, MRN [id number]  PCP:  Lorn Junes, FNP (Inactive)   [facility] Providers Cardiologist:  Debbe Odea, MD     Referring MD: Lorn Junes, FNP   Chief Complaint  Patient presents with   New Patient (Initial Visit)    Referred for cardiac evaluation of Hypertension.  Patient reports intermittent left side chest pain with occasional radiating down left arm.  Seen [name] cardiology over 5 years ago.     [name] is a 62 y.o. male who is being seen today for the evaluation of chest pain at the request of Lorn Junes, FNP.   History of Present Illness:    [name] is a 62 y.o. male with a hx of CAD (coronary artery calcifications on chest CT), hypertension, hyperlipidemia, TIA, carotid stenosis s/p right carotid stent placement 10/2020 current smoker x 45+ years presenting with chest pain.  Patient states having chest pain over the past 2 months, symptoms exacerbated with exertion.  Pain sometimes radiates to left arm and neck area.  Endorses smoking, compliant with medications as prescribed.  Blood pressure at home is typically in the 140s to 170s systolic.  Amlodipine recently added by primary care physician.  Past Medical History:  Diagnosis Date   Arthritis    Carotid artery disease (HCC)    seen by [facility]   Coronary artery disease    Hx of transient ischemic attack (TIA)    Hyperlipidemia    Hypertension    Thyroid disease    hypothyrodism    Past Surgical History:  Procedure Laterality Date   BACK SURGERY     CAROTID PTA/STENT INTERVENTION Right 10/22/2020   Procedure: CAROTID PTA/STENT INTERVENTION;  Surgeon: Renford Dills, MD;  Location: [facility];  Service: Cardiovascular;  Laterality: Right;   CAROTID PTA/STENT INTERVENTION Left 12/17/2020   Procedure: CAROTID PTA/STENT INTERVENTION;  Surgeon: Renford Dills, MD;  Location: [facility];  Service: Cardiovascular;  Laterality: Left;   CATARACT EXTRACTION W/PHACO Right 08/18/2022   Procedure: CATARACT EXTRACTION PHACO AND INTRAOCULAR LENS PLACEMENT (IOC) RIGHT MALYUGIN 4.19 00:24.5;  Surgeon: Lockie Mola, MD;  Location: [facility];  Service: Ophthalmology;  Laterality: Right;    Current Medications: Current Meds  Medication Sig   acetaminophen (TYLENOL) 500 MG tablet Take 1,000 mg by mouth every 6 (six) hours as needed for mild pain.   amLODipine (NORVASC) 5 MG tablet Take 5 mg by mouth daily.   aspirin EC 81 MG EC tablet Take 1 tablet (81 mg total) by mouth at bedtime. Swallow whole.   atorvastatin (LIPITOR) 40 MG tablet Take 40 mg by mouth at bedtime.   hydrochlorothiazide (HYDRODIURIL) 25 MG tablet Take 25 mg by mouth at bedtime.   levothyroxine (SYNTHROID) 137 MCG tablet Take 137 mcg by mouth at bedtime.   lisinopril (ZESTRIL) 20 MG tablet Take 20 mg by mouth at bedtime.   metoprolol tartrate (LOPRESSOR) 100 MG tablet Take 1 tablet (100 mg total) by mouth once for 1 dose. TAKE TWO HOURS PRIOR TO CARDIAC CTA   nitroGLYCERIN (NITROSTAT) 0.4 MG SL tablet Place 1 tablet (0.4 mg total) under the tongue every 5 (five) minutes as needed for chest pain.     Allergies:   Patient has no known allergies.   Social History   Socioeconomic History   Marital status: Married    Spouse  name: Not on file   Number of children: Not on file   Years of education: Not on file   Highest education level: Not on file  Occupational History   Not on file  Tobacco Use   Smoking status: Every Day    Current packs/day: 1.00    Average packs/day: 1 pack/day for 47.0 years (47.0 ttl pk-yrs)    Types: Cigarettes   Smokeless tobacco: Never  Vaping Use   Vaping status: Never Used  Substance and Sexual Activity   Alcohol use: Never   Drug use: Never   Sexual activity: Not on file  Other Topics Concern   Not on file  Social History  Narrative   Not on file   Social Determinants of Health   Financial Resource Strain: Not on file  Food Insecurity: Not on file  Transportation Needs: Not on file  Physical Activity: Not on file  Stress: Not on file  Social Connections: Not on file     Family History: The patient's family history includes Cancer in his father; Diabetes in his mother; Heart attack in his mother; Heart disease in his brother and sister; Hypothyroidism in his brother, mother, and sister.  ROS:   Please see the history of present illness.     All other systems reviewed and are negative.  EKGs/Labs/Other Studies Reviewed:    The following studies were reviewed today:  EKG Interpretation Date/Time:  Friday February 25 2023 09:52:18 EST Ventricular Rate:  72 PR Interval:  130 QRS Duration:  82 QT Interval:  372 QTC Calculation: 407 R Axis:   -1  Text Interpretation: Normal sinus rhythm Nonspecific ST abnormality Confirmed by Debbe Odea ([id number]) on 02/25/2023 9:59:33 AM    Recent Labs: No results found for requested labs within last 365 days.  Recent Lipid Panel No results found for: "CHOL", "TRIG", "HDL", "CHOLHDL", "VLDL", "LDLCALC", "LDLDIRECT"   Risk Assessment/Calculations:    HYPERTENSION CONTROL Vitals:   02/25/23 0948 02/25/23 0954  BP: (!) 164/62 (!) 148/62    The patient's blood pressure is elevated above target today.  In order to address the patient's elevated BP: Blood pressure will be monitored at home to determine if medication changes need to be made.            Physical Exam:    VS:  BP (!) 148/62 (BP Location: Left Arm, Patient Position: Sitting, Cuff Size: Large)   Pulse 72   Ht 5\' 6"  (1.676 m)   Wt 175 lb 9.6 oz (79.7 kg)   SpO2 99%   BMI 28.34 kg/m     Wt Readings from Last 3 Encounters:  02/25/23 175 lb 9.6 oz (79.7 kg)  01/24/23 177 lb 3.2 oz (80.4 kg)  08/18/22 175 lb 11.2 oz (79.7 kg)     GEN:  Well nourished, well developed in no acute  distress HEENT: Normal NECK: No JVD; No carotid bruits CARDIAC: RRR, no murmurs, rubs, gallops RESPIRATORY:  Clear to auscultation without rales, wheezing or rhonchi  ABDOMEN: Soft, non-tender, non-distended MUSCULOSKELETAL:  No edema; No deformity  SKIN: Warm and dry NEUROLOGIC:  Alert and oriented x 3 PSYCHIATRIC:  Normal affect   ASSESSMENT:    1. Precordial pain   2. Coronary artery disease involving native coronary artery of native heart, unspecified whether angina present   3. Primary hypertension   4. Smoking    PLAN:    In order of problems listed above:  Chest pain, consistent with angina.  Get echo, get  coronary CTA.  Okay for sublingual nitroglycerin. Coronary artery calcification.  Echo and coronary CTA as above.  Continue aspirin, Lipitor 40 mg daily.  Obtain lipid profile. Hypertension, continue Norvasc 5 mg daily, lisinopril 20, HCTZ 25 mg daily.  Titrate lisinopril if BP not controlled at follow-up visit.  Follow-up after cardiac testing.      Medication Adjustments/Labs and Tests Ordered: Current medicines are reviewed at length with the patient today.  Concerns regarding medicines are outlined above.  Orders Placed This Encounter  Procedures   CT CORONARY MORPH W/CTA COR W/SCORE W/CA W/CM &/OR WO/CM   Lipid panel   Basic Metabolic Panel (BMET)   EKG 12-Lead   ECHOCARDIOGRAM COMPLETE   Meds ordered this encounter  Medications   metoprolol tartrate (LOPRESSOR) 100 MG tablet    Sig: Take 1 tablet (100 mg total) by mouth once for 1 dose. TAKE TWO HOURS PRIOR TO CARDIAC CTA    Dispense:  1 tablet    Refill:  0   nitroGLYCERIN (NITROSTAT) 0.4 MG SL tablet    Sig: Place 1 tablet (0.4 mg total) under the tongue every 5 (five) minutes as needed for chest pain.    Dispense:  30 tablet    Refill:  0    Patient Instructions  Medication Instructions:   As needed Take NTG Place 1 tablet (0.4 mg total) under the tongue every 5 (five) minutes as needed for  chest pain. Max 3 tablets. If chest pain is not relieved after 3 tablets seek urgent medical attention.   *If you need a refill on your cardiac medications before your next appointment, please call your pharmacy*   Lab Work:  Your physician recommends you have labs - BMP / Lipid  If you have labs (blood work) drawn today and your tests are completely normal, you will receive your results only by: MyChart Message (if you have MyChart) OR A paper copy in the mail If you have any lab test that is abnormal or we need to change your treatment, we will call you to review the results.   Testing/Procedures:  Your physician has requested that you have an echocardiogram. Echocardiography is a painless test that uses sound waves to create images of your heart. It provides your doctor with information about the size and shape of your heart and how well your heart's chambers and valves are working. This procedure takes approximately one hour. There are no restrictions for this procedure. Please do NOT wear cologne, perfume, aftershave, or lotions (deodorant is allowed). Please arrive 15 minutes prior to your appointment time.  Please note: We ask at that you not bring children with you during ultrasound (echo/ vascular) testing. Due to room size and safety concerns, children are not allowed in the ultrasound rooms during exams. Our front office staff cannot provide observation of children in our lobby area while testing is being conducted. An adult accompanying a patient to their appointment will only be allowed in the ultrasound room at the discretion of the ultrasound technician under special circumstances. We apologize for any inconvenience.   Your cardiac CT will be scheduled at one of the below locations:   [facility] [address] [place], [place] [address] [phone]  OR   [facility] [address] [place],  [place] [address] [phone]  If scheduled at [facility] or [facility], please arrive 15 mins early for check-in and test prep.  There  is spacious parking and easy access to the radiology department from the [facility] entrance. Please enter here and check-in with the desk attendant.   Please follow these instructions carefully (unless otherwise directed):  An IV will be required for this test and Nitroglycerin will be given.  Hold all erectile dysfunction medications at least 3 days (72 hrs) prior to test. (Ie viagra, cialis, sildenafil, tadalafil, etc)   On the Night Before the Test: Be sure to Drink plenty of water. Do not consume any caffeinated/decaffeinated beverages or chocolate 12 hours prior to your test. Do not take any antihistamines 12 hours prior to your test.  On the Day of the Test: Drink plenty of water until 1 hour prior to the test. Do not eat any food 1 hour prior to test. You may take your regular medications prior to the test.  Take metoprolol (Lopressor) two hours prior to test. If you take Furosemide/Hydrochlorothiazide/Spironolactone, please HOLD on the morning of the test. FEMALES- please wear underwire-free bra if available, avoid dresses & tight clothing      After the Test: Drink plenty of water. After receiving IV contrast, you may experience a mild flushed feeling. This is normal. On occasion, you may experience a mild rash up to 24 hours after the test. This is not dangerous. If this occurs, you can take Benadryl 25 mg and increase your fluid intake. If you experience trouble breathing, this can be serious. If it is severe call 911 IMMEDIATELY. If it is mild, please call our office. If you take any of these medications: Glipizide/Metformin, Avandament, Glucavance, please do not take 48 hours after completing test unless otherwise instructed.  We will call to schedule your test 2-4 weeks out  understanding that some insurance companies will need an authorization prior to the service being performed.   For more information and frequently asked questions, please visit our website : [email or web address]  For non-scheduling related questions, please contact the cardiac imaging nurse navigator should you have any questions/concerns: Cardiac Imaging Nurse Navigators Direct Office Dial: [phone]   For scheduling needs, including cancellations and rescheduling, please call [place], [phone].   Follow-Up: At [facility], you and your health needs are our priority.  As part of our continuing mission to provide you with exceptional heart care, we have created designated Provider Care Teams.  These Care Teams include your primary Cardiologist (physician) and Advanced Practice Providers (APPs -  Physician Assistants and Nurse Practitioners) who all work together to provide you with the care you need, when you need it.  We recommend signing up for the patient portal called "MyChart".  Sign up information is provided on this After Visit Summary.  MyChart is used to connect with patients for Virtual Visits (Telemedicine).  Patients are able to view lab/test results, encounter notes, upcoming appointments, etc.  Non-urgent messages can be sent to your provider as well.   To learn more about what you can do with MyChart, go to [email or web address].    Your next appointment:   8 - 10 weeks  Provider:   You may see Debbe Odea, MD or one of the following Advanced Practice Providers on your designated Care Team:   Nicolasa Ducking, NP Eula Listen, PA-C Cadence Fransico Michael, PA-C Charlsie Quest, NP Carlos Levering, NP   Signed, Debbe Odea, MD  02/25/2023 10:39 AM    [facility]

## 2023-02-25 NOTE — Patient Instructions (Signed)
Medication Instructions:   As needed Take NTG Place 1 tablet (0.4 mg total) under the tongue every 5 (five) minutes as needed for chest pain. Max 3 tablets. If chest pain is not relieved after 3 tablets seek urgent medical attention.   *If you need a refill on your cardiac medications before your next appointment, please call your pharmacy*   Lab Work:  Your physician recommends you have labs - BMP / Lipid  If you have labs (blood work) drawn today and your tests are completely normal, you will receive your results only by: MyChart Message (if you have MyChart) OR A paper copy in the mail If you have any lab test that is abnormal or we need to change your treatment, we will call you to review the results.   Testing/Procedures:  Your physician has requested that you have an echocardiogram. Echocardiography is a painless test that uses sound waves to create images of your heart. It provides your doctor with information about the size and shape of your heart and how well your heart's chambers and valves are working. This procedure takes approximately one hour. There are no restrictions for this procedure. Please do NOT wear cologne, perfume, aftershave, or lotions (deodorant is allowed). Please arrive 15 minutes prior to your appointment time.  Please note: We ask at that you not bring children with you during ultrasound (echo/ vascular) testing. Due to room size and safety concerns, children are not allowed in the ultrasound rooms during exams. Our front office staff cannot provide observation of children in our lobby area while testing is being conducted. An adult accompanying a patient to their appointment will only be allowed in the ultrasound room at the discretion of the ultrasound technician under special circumstances. We apologize for any inconvenience.   Your cardiac CT will be scheduled at one of the below locations:   [facility] [address] [place], [place] [address] [phone]  OR   [facility] [address] [place], [place] [address] [phone]  If scheduled at [facility] or [facility], please arrive 15 mins early for check-in and test prep.  There is spacious parking and easy access to the radiology department from the [facility] entrance. Please enter here and check-in with the desk attendant.   Please follow these instructions carefully (unless otherwise directed):  An IV will be required for this test and Nitroglycerin will be given.  Hold all erectile dysfunction medications at least 3 days (72 hrs) prior to test. (Ie viagra, cialis, sildenafil, tadalafil, etc)   On the Night Before the Test: Be sure to Drink plenty of water. Do not consume any caffeinated/decaffeinated beverages or chocolate 12 hours prior to your test. Do not take any antihistamines 12 hours prior to your test.  On the Day of the Test: Drink plenty of water until 1 hour prior to the test. Do not eat any food 1 hour prior to test. You may take your regular medications prior to the test.  Take metoprolol (Lopressor) two hours prior to test. If you take Furosemide/Hydrochlorothiazide/Spironolactone, please HOLD on the morning of the test. FEMALES- please wear underwire-free bra if available, avoid dresses & tight clothing      After the Test: Drink plenty of water. After receiving IV contrast, you may experience a mild flushed feeling. This is normal. On occasion, you may experience a mild rash up to 24 hours after  the test. This is not dangerous. If this occurs, you can take Benadryl 25 mg and increase your fluid intake. If you experience trouble breathing, this can be serious. If it is severe call 911 IMMEDIATELY. If it is mild, please call our office. If you take any of these medications: Glipizide/Metformin, Avandament, Glucavance,  please do not take 48 hours after completing test unless otherwise instructed.  We will call to schedule your test 2-4 weeks out understanding that some insurance companies will need an authorization prior to the service being performed.   For more information and frequently asked questions, please visit our website : [email or web address]  For non-scheduling related questions, please contact the cardiac imaging nurse navigator should you have any questions/concerns: Cardiac Imaging Nurse Navigators Direct Office Dial: [phone]   For scheduling needs, including cancellations and rescheduling, please call [place], [phone].   Follow-Up: At [facility], you and your health needs are our priority.  As part of our continuing mission to provide you with exceptional heart care, we have created designated Provider Care Teams.  These Care Teams include your primary Cardiologist (physician) and Advanced Practice Providers (APPs -  Physician Assistants and Nurse Practitioners) who all work together to provide you with the care you need, when you need it.  We recommend signing up for the patient portal called "MyChart".  Sign up information is provided on this After Visit Summary.  MyChart is used to connect with patients for Virtual Visits (Telemedicine).  Patients are able to view lab/test results, encounter notes, upcoming appointments, etc.  Non-urgent messages can be sent to your provider as well.   To learn more about what you can do with MyChart, go to [email or web address].    Your next appointment:   8 - 10 weeks  Provider:   You may see Debbe Odea, MD or one of the following Advanced Practice Providers on your designated Care Team:   Nicolasa Ducking, NP Eula Listen, PA-C Cadence Fransico Michael, PA-C Charlsie Quest, NP Carlos Levering, NP

## 2023-02-26 LAB — BASIC METABOLIC PANEL
BUN/Creatinine Ratio: 15 (ref 10–24)
BUN: 24 mg/dL (ref 8–27)
CO2: 21 mmol/L (ref 20–29)
Calcium: 9.7 mg/dL (ref 8.6–10.2)
Chloride: 108 mmol/L — ABNORMAL HIGH (ref 96–106)
Creatinine, Ser: 1.64 mg/dL — ABNORMAL HIGH (ref 0.76–1.27)
Glucose: 95 mg/dL (ref 70–99)
Potassium: 5.3 mmol/L — ABNORMAL HIGH (ref 3.5–5.2)
Sodium: 143 mmol/L (ref 134–144)
eGFR: 47 mL/min/{1.73_m2} — ABNORMAL LOW (ref >59)

## 2023-02-26 LAB — LIPID PANEL
Chol/HDL Ratio: 2.8 ratio (ref 0.0–5.0)
Cholesterol, Total: 93 mg/dL — ABNORMAL LOW (ref 100–199)
HDL: 33 mg/dL — ABNORMAL LOW (ref >39)
LDL Chol Calc (NIH): 40 mg/dL (ref 0–99)
Triglycerides: 103 mg/dL (ref 0–149)
VLDL Cholesterol Cal: 20 mg/dL (ref 5–40)

## 2023-03-03 ENCOUNTER — Ambulatory Visit: Payer: Commercial Managed Care - PPO

## 2023-03-15 ENCOUNTER — Encounter (HOSPITAL_COMMUNITY): Payer: Self-pay

## 2023-03-17 ENCOUNTER — Ambulatory Visit (HOSPITAL_BASED_OUTPATIENT_CLINIC_OR_DEPARTMENT_OTHER)
Admission: RE | Admit: 2023-03-17 | Discharge: 2023-03-17 | Disposition: A | Discharge status: Home / Self Care | Payer: Commercial Managed Care - PPO | Source: Ambulatory Visit | Attending: Cardiology | Admitting: Cardiology

## 2023-03-17 ENCOUNTER — Ambulatory Visit: Payer: Commercial Managed Care - PPO | Attending: Cardiology

## 2023-03-17 ENCOUNTER — Other Ambulatory Visit: Payer: Self-pay | Admitting: Cardiology

## 2023-03-17 DIAGNOSIS — I2510 Atherosclerotic heart disease of native coronary artery without angina pectoris: Secondary | ICD-10-CM | POA: Diagnosis not present

## 2023-03-17 DIAGNOSIS — I25110 Atherosclerotic heart disease of native coronary artery with unstable angina pectoris: Secondary | ICD-10-CM | POA: Diagnosis not present

## 2023-03-17 DIAGNOSIS — R931 Abnormal findings on diagnostic imaging of heart and coronary circulation: Secondary | ICD-10-CM | POA: Insufficient documentation

## 2023-03-17 DIAGNOSIS — R072 Precordial pain: Secondary | ICD-10-CM | POA: Insufficient documentation

## 2023-03-17 DIAGNOSIS — I200 Unstable angina: Secondary | ICD-10-CM | POA: Diagnosis not present

## 2023-03-17 LAB — ECHOCARDIOGRAM COMPLETE
Area-P 1/2: 4.06 cm2
P 1/2 time: 425 ms
S' Lateral: 3 cm

## 2023-03-17 MED ORDER — DILTIAZEM HCL 25 MG/5ML IV SOLN: 10.0000 mg | INTRAVENOUS | Status: DC | PRN

## 2023-03-17 MED ORDER — METOPROLOL TARTRATE 5 MG/5ML IV SOLN: 10.0000 mg | Freq: Once | INTRAVENOUS | Status: DC | PRN

## 2023-03-17 MED ORDER — IOHEXOL 350 MG/ML SOLN
75.0000 mL | Freq: Once | INTRAVENOUS | Status: AC | PRN
Start: 2023-03-17 — End: 2023-03-17
  Administered 2023-03-17: 75 mL via INTRAVENOUS

## 2023-03-17 MED ORDER — NITROGLYCERIN 0.4 MG SL SUBL
0.8000 mg | SUBLINGUAL_TABLET | Freq: Once | SUBLINGUAL | Status: AC
Start: 2023-03-17 — End: 2023-03-17
  Administered 2023-03-17: 0.8 mg via SUBLINGUAL

## 2023-03-17 MED ORDER — SODIUM CHLORIDE 0.9 % IV BOLUS
125.0000 mL | Freq: Once | INTRAVENOUS | Status: AC
  Administered 2023-03-17: 125 mL via INTRAVENOUS

## 2023-03-17 NOTE — Progress Notes (Signed)
Patient tolerated procedure well. Ambulate w/o difficulty. Denies light headedness or being dizzy. Sitting up drinking water provided. Encouraged to drink extra water today and reasoning explained. Verbalized understanding. All questions answered. ABC intact. No further needs. Discharge from procedure area w/o issues.

## 2023-03-18 ENCOUNTER — Inpatient Hospital Stay
Admission: EM | Admit: 2023-03-18 | Discharge: 2023-03-22 | DRG: 287 | Disposition: A | Discharge status: Other Acute Inpatient Hospital | Payer: Commercial Managed Care - PPO | Attending: Student in an Organized Health Care Education/Training Program | Admitting: Student in an Organized Health Care Education/Training Program

## 2023-03-18 ENCOUNTER — Encounter: Payer: Self-pay | Admitting: Internal Medicine

## 2023-03-18 ENCOUNTER — Ambulatory Visit: Payer: Commercial Managed Care - PPO | Attending: Student | Admitting: Student

## 2023-03-18 ENCOUNTER — Other Ambulatory Visit: Payer: Self-pay

## 2023-03-18 ENCOUNTER — Encounter: Payer: Self-pay | Admitting: Student

## 2023-03-18 ENCOUNTER — Emergency Department: Payer: Commercial Managed Care - PPO

## 2023-03-18 VITALS — BP 150/68 | HR 71 | Ht 66.0 in | Wt 177.0 lb

## 2023-03-18 DIAGNOSIS — Z01810 Encounter for preprocedural cardiovascular examination: Secondary | ICD-10-CM | POA: Diagnosis not present

## 2023-03-18 DIAGNOSIS — I10 Essential (primary) hypertension: Secondary | ICD-10-CM

## 2023-03-18 DIAGNOSIS — Z8249 Family history of ischemic heart disease and other diseases of the circulatory system: Secondary | ICD-10-CM | POA: Diagnosis not present

## 2023-03-18 DIAGNOSIS — E039 Hypothyroidism, unspecified: Secondary | ICD-10-CM | POA: Diagnosis present

## 2023-03-18 DIAGNOSIS — R072 Precordial pain: Secondary | ICD-10-CM

## 2023-03-18 DIAGNOSIS — I6523 Occlusion and stenosis of bilateral carotid arteries: Secondary | ICD-10-CM

## 2023-03-18 DIAGNOSIS — Z833 Family history of diabetes mellitus: Secondary | ICD-10-CM

## 2023-03-18 DIAGNOSIS — I25118 Atherosclerotic heart disease of native coronary artery with other forms of angina pectoris: Secondary | ICD-10-CM | POA: Diagnosis not present

## 2023-03-18 DIAGNOSIS — Z720 Tobacco use: Secondary | ICD-10-CM | POA: Diagnosis not present

## 2023-03-18 DIAGNOSIS — I2510 Atherosclerotic heart disease of native coronary artery without angina pectoris: Secondary | ICD-10-CM | POA: Diagnosis not present

## 2023-03-18 DIAGNOSIS — I200 Unstable angina: Secondary | ICD-10-CM | POA: Diagnosis present

## 2023-03-18 DIAGNOSIS — K219 Gastro-esophageal reflux disease without esophagitis: Secondary | ICD-10-CM | POA: Diagnosis present

## 2023-03-18 DIAGNOSIS — R6510 Systemic inflammatory response syndrome (SIRS) of non-infectious origin without acute organ dysfunction: Secondary | ICD-10-CM | POA: Diagnosis not present

## 2023-03-18 DIAGNOSIS — Z8679 Personal history of other diseases of the circulatory system: Secondary | ICD-10-CM

## 2023-03-18 DIAGNOSIS — Z79899 Other long term (current) drug therapy: Secondary | ICD-10-CM | POA: Diagnosis not present

## 2023-03-18 DIAGNOSIS — Z9841 Cataract extraction status, right eye: Secondary | ICD-10-CM

## 2023-03-18 DIAGNOSIS — I129 Hypertensive chronic kidney disease with stage 1 through stage 4 chronic kidney disease, or unspecified chronic kidney disease: Secondary | ICD-10-CM | POA: Diagnosis not present

## 2023-03-18 DIAGNOSIS — E785 Hyperlipidemia, unspecified: Secondary | ICD-10-CM | POA: Diagnosis present

## 2023-03-18 DIAGNOSIS — J9382 Other air leak: Secondary | ICD-10-CM | POA: Diagnosis not present

## 2023-03-18 DIAGNOSIS — Z961 Presence of intraocular lens: Secondary | ICD-10-CM | POA: Diagnosis present

## 2023-03-18 DIAGNOSIS — J9601 Acute respiratory failure with hypoxia: Secondary | ICD-10-CM | POA: Diagnosis not present

## 2023-03-18 DIAGNOSIS — Z7982 Long term (current) use of aspirin: Secondary | ICD-10-CM | POA: Diagnosis not present

## 2023-03-18 DIAGNOSIS — I739 Peripheral vascular disease, unspecified: Secondary | ICD-10-CM | POA: Diagnosis present

## 2023-03-18 DIAGNOSIS — J9811 Atelectasis: Secondary | ICD-10-CM | POA: Diagnosis not present

## 2023-03-18 DIAGNOSIS — Z79890 Hormone replacement therapy (postmenopausal): Secondary | ICD-10-CM

## 2023-03-18 DIAGNOSIS — N1831 Chronic kidney disease, stage 3a: Secondary | ICD-10-CM | POA: Diagnosis not present

## 2023-03-18 DIAGNOSIS — G453 Amaurosis fugax: Secondary | ICD-10-CM | POA: Diagnosis present

## 2023-03-18 DIAGNOSIS — I6529 Occlusion and stenosis of unspecified carotid artery: Secondary | ICD-10-CM | POA: Diagnosis not present

## 2023-03-18 DIAGNOSIS — Z8673 Personal history of transient ischemic attack (TIA), and cerebral infarction without residual deficits: Secondary | ICD-10-CM

## 2023-03-18 DIAGNOSIS — F17210 Nicotine dependence, cigarettes, uncomplicated: Secondary | ICD-10-CM | POA: Diagnosis present

## 2023-03-18 DIAGNOSIS — I25110 Atherosclerotic heart disease of native coronary artery with unstable angina pectoris: Secondary | ICD-10-CM | POA: Diagnosis present

## 2023-03-18 DIAGNOSIS — I2089 Other forms of angina pectoris: Principal | ICD-10-CM

## 2023-03-18 DIAGNOSIS — I2585 Chronic coronary microvascular dysfunction: Secondary | ICD-10-CM | POA: Diagnosis not present

## 2023-03-18 DIAGNOSIS — N179 Acute kidney failure, unspecified: Secondary | ICD-10-CM | POA: Diagnosis not present

## 2023-03-18 DIAGNOSIS — E7800 Pure hypercholesterolemia, unspecified: Secondary | ICD-10-CM | POA: Diagnosis not present

## 2023-03-18 LAB — LIPID PANEL
Cholesterol: 93 mg/dL (ref 0–200)
HDL: 38 mg/dL — ABNORMAL LOW (ref >40)
LDL Cholesterol: 38 mg/dL (ref 0–99)
Total CHOL/HDL Ratio: 2.4 {ratio}
Triglycerides: 83 mg/dL (ref <150)
VLDL: 17 mg/dL (ref 0–40)

## 2023-03-18 LAB — CBC WITH DIFFERENTIAL/PLATELET
Abs Immature Granulocytes: 0.04 10*3/uL (ref 0.00–0.07)
Basophils Absolute: 0.1 10*3/uL (ref 0.0–0.1)
Basophils Relative: 1 %
Eosinophils Absolute: 0.6 10*3/uL — ABNORMAL HIGH (ref 0.0–0.5)
Eosinophils Relative: 5 %
HCT: 36.2 % — ABNORMAL LOW (ref 39.0–52.0)
Hemoglobin: 12.2 g/dL — ABNORMAL LOW (ref 13.0–17.0)
Immature Granulocytes: 0 %
Lymphocytes Relative: 28 %
Lymphs Abs: 3.2 10*3/uL (ref 0.7–4.0)
MCH: 32.5 pg (ref 26.0–34.0)
MCHC: 33.7 g/dL (ref 30.0–36.0)
MCV: 96.5 fL (ref 80.0–100.0)
Monocytes Absolute: 0.9 10*3/uL (ref 0.1–1.0)
Monocytes Relative: 8 %
Neutro Abs: 6.6 10*3/uL (ref 1.7–7.7)
Neutrophils Relative %: 58 %
Platelets: 275 10*3/uL (ref 150–400)
RBC: 3.75 MIL/uL — ABNORMAL LOW (ref 4.22–5.81)
RDW: 13.4 % (ref 11.5–15.5)
WBC: 11.4 10*3/uL — ABNORMAL HIGH (ref 4.0–10.5)
nRBC: 0 % (ref 0.0–0.2)

## 2023-03-18 LAB — BASIC METABOLIC PANEL
Anion gap: 8 (ref 5–15)
BUN: 31 mg/dL — ABNORMAL HIGH (ref 8–23)
CO2: 22 mmol/L (ref 22–32)
Calcium: 9.5 mg/dL (ref 8.9–10.3)
Chloride: 112 mmol/L — ABNORMAL HIGH (ref 98–111)
Creatinine, Ser: 1.5 mg/dL — ABNORMAL HIGH (ref 0.61–1.24)
GFR, Estimated: 53 mL/min — ABNORMAL LOW (ref >60)
Glucose, Bld: 91 mg/dL (ref 70–99)
Potassium: 4 mmol/L (ref 3.5–5.1)
Sodium: 142 mmol/L (ref 135–145)

## 2023-03-18 LAB — HEMOGLOBIN A1C
Hgb A1c MFr Bld: 5.5 % (ref 4.8–5.6)
Mean Plasma Glucose: 111.15 mg/dL

## 2023-03-18 LAB — TROPONIN I (HIGH SENSITIVITY)
Troponin I (High Sensitivity): 6 ng/L (ref <18)
Troponin I (High Sensitivity): 8 ng/L (ref <18)

## 2023-03-18 MED ORDER — NICOTINE 21 MG/24HR TD PT24
21.0000 mg | MEDICATED_PATCH | Freq: Every day | TRANSDERMAL | Status: DC
  Administered 2023-03-18 – 2023-03-22 (×5): 21 mg via TRANSDERMAL
  Filled 2023-03-18 (×5): qty 1

## 2023-03-18 MED ORDER — ACETAMINOPHEN 325 MG PO TABS
650.0000 mg | ORAL_TABLET | ORAL | Status: DC | PRN
  Administered 2023-03-20: 650 mg via ORAL
  Filled 2023-03-18 (×2): qty 2

## 2023-03-18 MED ORDER — ATORVASTATIN CALCIUM 20 MG PO TABS
20.0000 mg | ORAL_TABLET | Freq: Every day | ORAL | Status: DC
  Administered 2023-03-18 – 2023-03-22 (×5): 20 mg via ORAL
  Filled 2023-03-18 (×2): qty 1
  Filled 2023-03-18: qty 2
  Filled 2023-03-18 (×2): qty 1

## 2023-03-18 MED ORDER — PANTOPRAZOLE SODIUM 40 MG IV SOLR
40.0000 mg | Freq: Two times a day (BID) | INTRAVENOUS | Status: DC
  Administered 2023-03-18: 40 mg via INTRAVENOUS
  Filled 2023-03-18 (×2): qty 10

## 2023-03-18 MED ORDER — ONDANSETRON HCL 4 MG/2ML IJ SOLN: 4.0000 mg | Freq: Four times a day (QID) | INTRAMUSCULAR | Status: DC | PRN

## 2023-03-18 MED ORDER — SODIUM CHLORIDE 0.9 % IV SOLN: INTRAVENOUS | Status: DC

## 2023-03-18 MED ORDER — ASPIRIN 81 MG PO CHEW
324.0000 mg | CHEWABLE_TABLET | Freq: Once | ORAL | Status: AC
  Administered 2023-03-18: 324 mg via ORAL
  Filled 2023-03-18: qty 4

## 2023-03-18 MED ORDER — NITROGLYCERIN 0.4 MG SL SUBL: 0.4000 mg | SUBLINGUAL_TABLET | SUBLINGUAL | Status: DC | PRN

## 2023-03-18 MED ORDER — HEPARIN SODIUM (PORCINE) 5000 UNIT/ML IJ SOLN
5000.0000 [IU] | Freq: Three times a day (TID) | INTRAMUSCULAR | Status: DC
  Administered 2023-03-18 – 2023-03-21 (×8): 5000 [IU] via SUBCUTANEOUS
  Filled 2023-03-18 (×8): qty 1

## 2023-03-18 NOTE — Plan of Care (Signed)

## 2023-03-18 NOTE — H&P (Signed)
History and Physical    Patient: [name] MVH:[id number] DOB: 04-26-1960 DOA: 03/18/2023 DOS: the patient was seen and examined on 03/18/2023 PCP: Lorn Junes, FNP (Inactive)  Patient coming from: Home Chief Complaint:  Chief Complaint  Patient presents with   Chest Pain  HPI: [name] is a 62 y.o. male with medical history significant for CAD (coronary artery calcifications on chest CT), hypertension, hyperlipidemia, tobacco abuse, hypothyroidism, TIA, carotid stenosis s/p right carotid stent placement 10/2020 current smoker x 45+ years presenting with chest pain.  Patient was actually seen by cardiology on November 15 with [facility] for chest discomfort that has been going on over the past 2 months with worsening of pain with exertion and radiation to the left arm and neck. Chart review shows that patient was seen today by cardiology for follow-up of results and was sent to the emergency room today for abnormal results and need for further evaluation with a left heart cath for unstable angina symptoms.  Home meds include atorvastatin 40, aspirin 81, HCTZ 25,  lisinopril 20, levothyroxine 137, metoprolol 100,.  Nitroglycerin sublingual as needed. In emergency room vitals trend shows: Vitals:   03/18/23 1830 03/18/23 1900 03/18/23 1930 03/18/23 1940  BP: 138/62 (!) 144/57 138/64   Pulse: 71 71 77   Temp:    97.8 F (36.6 C)  Resp: 19 19 (!) 22   Height:      Weight:      SpO2: 100% 100% 99%   TempSrc:    Axillary  BMI (Calculated):       >>Coronary CTA 03/17/2023: Coronary calcium score 1175 (97th percentile).  Severe proximal LM stenosis, moderate proximal LCx/OM1 stenosis.  Mild RCA stenosis.  FFR showed significant stenosis in ostial LM, significant stenosis in mid RCA. >>Echo 03/17/2023: EF 60 to 65%.  No RWMA.  Grade I DD.  Normal RV size/function.  Normal PA pressure.  Mild to moderate LAE.  Mild MR.  Mild to moderate TR.  Moderate AI.  Mild  aortic valve calcification without stenosis.  Labs are notable for : -AKI of 1.50/ BUN of 31 and normal electrolytes.  -LFT added on .  -CBC shows leucocytosis of 11.4 and anemia of 12.2   In the ED pt received: Medications  nitroGLYCERIN (NITROSTAT) SL tablet 0.4 mg (has no administration in time range)  pantoprazole (PROTONIX) injection 40 mg (has no administration in time range)  nicotine (NICODERM CQ - dosed in mg/24 hours) patch 21 mg (has no administration in time range)  aspirin chewable tablet 324 mg (324 mg Oral Given 03/18/23 1934)    Review of Systems  Cardiovascular:  Positive for chest pain.  All other systems reviewed and are negative.  Past Medical History:  Diagnosis Date   Arthritis    Carotid artery disease (HCC)    seen by [facility]   Coronary artery disease    Hx of transient ischemic attack (TIA)    Hyperlipidemia    Hypertension    Thyroid disease    hypothyrodism   Past Surgical History:  Procedure Laterality Date   BACK SURGERY     CAROTID PTA/STENT INTERVENTION Right 10/22/2020   Procedure: CAROTID PTA/STENT INTERVENTION;  Surgeon: Renford Dills, MD;  Location: [facility] INVASIVE CV [facility];  Service: Cardiovascular;  Laterality: Right;   CAROTID PTA/STENT INTERVENTION Left 12/17/2020   Procedure: CAROTID PTA/STENT INTERVENTION;  Surgeon: Renford Dills, MD;  Location: [facility] INVASIVE CV LAB;  Service: Cardiovascular;  Laterality:  Left;   CATARACT EXTRACTION W/PHACO Right 08/18/2022   Procedure: CATARACT EXTRACTION PHACO AND INTRAOCULAR LENS PLACEMENT (IOC) RIGHT MALYUGIN 4.19 00:24.5;  Surgeon: Lockie Mola, MD;  Location: [facility];  Service: Ophthalmology;  Laterality: Right;    reports that he has been smoking cigarettes. He has a 47 pack-year smoking history. He has never used smokeless tobacco. He reports that he does not drink alcohol and does not use drugs.  No Known Allergies  Family History  Problem Relation Age  of Onset   Hypothyroidism Mother    Diabetes Mother    Heart attack Mother    Cancer Father    Heart disease Sister    Hypothyroidism Sister    Heart disease Brother    Hypothyroidism Brother     Prior to Admission medications   Medication Sig Start Date End Date Taking? Authorizing Provider  acetaminophen (TYLENOL) 500 MG tablet Take 1,000 mg by mouth every 6 (six) hours as needed for mild pain. 11/12/20   [provider]  amLODipine (NORVASC) 5 MG tablet Take 5 mg by mouth daily. 01/04/23   [provider]  aspirin EC 81 MG EC tablet Take 1 tablet (81 mg total) by mouth at bedtime. Swallow whole. 12/18/20   Schnier, Latina Craver, MD  atorvastatin (LIPITOR) 40 MG tablet Take 40 mg by mouth at bedtime. 08/07/20   [provider]  hydrochlorothiazide (HYDRODIURIL) 25 MG tablet Take 25 mg by mouth at bedtime. 07/31/20   [provider]  levothyroxine (SYNTHROID) 137 MCG tablet Take 137 mcg by mouth at bedtime.    [provider]  lisinopril (ZESTRIL) 20 MG tablet Take 20 mg by mouth at bedtime. 12/09/20   [provider]  metoprolol tartrate (LOPRESSOR) 100 MG tablet Take 1 tablet (100 mg total) by mouth once for 1 dose. TAKE TWO HOURS PRIOR TO CARDIAC CTA 02/25/23 02/25/23  Debbe Odea, MD  nitroGLYCERIN (NITROSTAT) 0.4 MG SL tablet Place 1 tablet (0.4 mg total) under the tongue every 5 (five) minutes as needed for chest pain. 02/25/23 05/26/23  Debbe Odea, MD     Vitals:   03/18/23 1830 03/18/23 1900 03/18/23 1930 03/18/23 1940  BP: 138/62 (!) 144/57 138/64   Pulse: 71 71 77   Resp: 19 19 (!) 22   Temp:    97.8 F (36.6 C)  TempSrc:    Axillary  SpO2: 100% 100% 99%   Weight:      Height:       Physical Exam Vitals and nursing note reviewed.  Constitutional:      General: He is not in acute distress. HENT:     Head: Normocephalic and atraumatic.     Right Ear: Hearing normal.     Left Ear: Hearing normal.     Nose:  Nose normal. No nasal deformity.     Mouth/Throat:     Lips: Pink.     Tongue: No lesions.     Pharynx: Oropharynx is clear.  Eyes:     General: Lids are normal.     Extraocular Movements: Extraocular movements intact.  Cardiovascular:     Rate and Rhythm: Normal rate and regular rhythm.     Heart sounds: Normal heart sounds.  Pulmonary:     Effort: Pulmonary effort is normal.     Breath sounds: Normal breath sounds.  Abdominal:     General: Bowel sounds are normal. There is no distension.     Palpations: Abdomen is soft. There is no  mass.     Tenderness: There is no abdominal tenderness.  Musculoskeletal:     Right lower leg: No edema.     Left lower leg: No edema.  Skin:    General: Skin is warm.  Neurological:     General: No focal deficit present.     Mental Status: He is alert and oriented to person, place, and time.     Cranial Nerves: Cranial nerves 2-12 are intact.  Psychiatric:        Attention and Perception: Attention normal.        Mood and Affect: Mood normal.        Speech: Speech normal.        Behavior: Behavior normal. Behavior is cooperative.      Labs on Admission: I have personally reviewed following labs and imaging studies Results for orders placed or performed during the hospital encounter of 03/18/23 (from the past 24 hour(s))  CBC with Differential     Status: Abnormal   Collection Time: 03/18/23  4:00 PM  Result Value Ref Range   WBC 11.4 (H) 4.0 - 10.5 K/uL   RBC 3.75 (L) 4.22 - 5.81 MIL/uL   Hemoglobin 12.2 (L) 13.0 - 17.0 g/dL   HCT 82.9 (L) 56.2 - 13.0 %   MCV 96.5 80.0 - 100.0 fL   MCH 32.5 26.0 - 34.0 pg   MCHC 33.7 30.0 - 36.0 g/dL   RDW 86.5 78.4 - 69.6 %   Platelets 275 150 - 400 K/uL   nRBC 0.0 0.0 - 0.2 %   Neutrophils Relative % 58 %   Neutro Abs 6.6 1.7 - 7.7 K/uL   Lymphocytes Relative 28 %   Lymphs Abs 3.2 0.7 - 4.0 K/uL   Monocytes Relative 8 %   Monocytes Absolute 0.9 0.1 - 1.0 K/uL   Eosinophils Relative 5 %    Eosinophils Absolute 0.6 (H) 0.0 - 0.5 K/uL   Basophils Relative 1 %   Basophils Absolute 0.1 0.0 - 0.1 K/uL   Immature Granulocytes 0 %   Abs Immature Granulocytes 0.04 0.00 - 0.07 K/uL  Basic metabolic panel     Status: Abnormal   Collection Time: 03/18/23  4:00 PM  Result Value Ref Range   Sodium 142 135 - 145 mmol/L   Potassium 4.0 3.5 - 5.1 mmol/L   Chloride 112 (H) 98 - 111 mmol/L   CO2 22 22 - 32 mmol/L   Glucose, Bld 91 70 - 99 mg/dL   BUN 31 (H) 8 - 23 mg/dL   Creatinine, Ser 2.95 (H) 0.61 - 1.24 mg/dL   Calcium 9.5 8.9 - 28.4 mg/dL   GFR, Estimated 53 (L) >60 mL/min   Anion gap 8 5 - 15  Troponin I (High Sensitivity)     Status: None   Collection Time: 03/18/23  4:00 PM  Result Value Ref Range   Troponin I (High Sensitivity) 6 <18 ng/L    CBC: Recent Labs  Lab 03/18/23 1600  WBC 11.4*  NEUTROABS 6.6  HGB 12.2*  HCT 36.2*  MCV 96.5  PLT 275   Basic Metabolic Panel: Recent Labs  Lab 03/18/23 1600  NA 142  K 4.0  CL 112*  CO2 22  GLUCOSE 91  BUN 31*  CREATININE 1.50*  CALCIUM 9.5   GFR: Estimated Creatinine Clearance: 51 mL/min (A) (by C-G formula based on SCr of 1.5 mg/dL (H)). Liver Function Tests: No results for input(s): "AST", "ALT", "ALKPHOS", "BILITOT", "PROT", "ALBUMIN" in the  last 168 hours. No results for input(s): "LIPASE", "AMYLASE" in the last 168 hours. No results for input(s): "AMMONIA" in the last 168 hours. Coagulation Profile: No results for input(s): "INR", "PROTIME" in the last 168 hours. Cardiac Enzymes: No results for input(s): "CKTOTAL", "CKMB", "CKMBINDEX", "TROPONINI" in the last 168 hours. BNP (last 3 results) No results for input(s): "PROBNP" in the last 8760 hours. HbA1C: No results for input(s): "HGBA1C" in the last 72 hours. CBG: No results for input(s): "GLUCAP" in the last 168 hours. Lipid Profile: No results for input(s): "CHOL", "HDL", "LDLCALC", "TRIG", "CHOLHDL", "LDLDIRECT" in the last 72 hours. Thyroid  Function Tests: No results for input(s): "TSH", "T4TOTAL", "FREET4", "T3FREE", "THYROIDAB" in the last 72 hours. Anemia Panel: No results for input(s): "VITAMINB12", "FOLATE", "FERRITIN", "TIBC", "IRON", "RETICCTPCT" in the last 72 hours. Urinalysis No results found for: "COLORURINE", "APPEARANCEUR", "LABSPEC", "PHURINE", "GLUCOSEU", "HGBUR", "BILIRUBINUR", "KETONESUR", "PROTEINUR", "UROBILINOGEN", "NITRITE", "LEUKOCYTESUR"    Unresulted Labs (From admission, onward)     Start     Ordered   03/18/23 1948  Hemoglobin A1c  Add-on,   AD        03/18/23 1947   03/18/23 1948  Lipid panel  Add-on,   AD        03/18/23 1947   03/18/23 1942  Magnesium  Add-on,   AD        03/18/23 1944   03/18/23 1942  T4, free  Add-on,   AD        03/18/23 1944   03/18/23 1942  TSH  Add-on,   AD        03/18/23 1944   03/18/23 1942  High sensitivity CRP  Add-on,   AD        03/18/23 1944            Medications  nitroGLYCERIN (NITROSTAT) SL tablet 0.4 mg (has no administration in time range)  pantoprazole (PROTONIX) injection 40 mg (has no administration in time range)  nicotine (NICODERM CQ - dosed in mg/24 hours) patch 21 mg (has no administration in time range)  aspirin chewable tablet 324 mg (324 mg Oral Given 03/18/23 1934)    Radiological Exams on Admission: DG Chest Portable 1 View  Result Date: 03/18/2023 CLINICAL DATA:  Chest pain for the past few months got worse today. EXAM: PORTABLE CHEST 1 VIEW COMPARISON:  Cardiac/coronary CTA dated 03/17/2023 and chest radiographs dated 12/27/2012. FINDINGS: Mildly enlarged cardiac silhouette. Clear lungs with normal vascularity. No pleural fluid. Mild left and minimal right glenohumeral degenerative changes. IMPRESSION: 1. Mild cardiomegaly. 2. No acute abnormality. Electronically Signed   By: Beckie Salts M.D.   On: 03/18/2023 18:34   CT CORONARY FFR DATA PREP & FLUID ANALYSIS  Result Date: 03/18/2023 EXAM: CT FFR ANALYSIS CLINICAL DATA:  Abnormal  CCTA FINDINGS: FFRct analysis was performed on the original cardiac CT angiogram dataset. Diagrammatic representation of the FFRct analysis is provided in a separate PDF document in PACS. This dictation was created using the PDF document and an interactive 3D model of the results. 3D model is not available in the EMR/PACS. Normal FFR range is >0.80. 1. Left Main:  significant stenosis in ostial LM.  FFRct 0.54 2. LAD: significant stenosis due to LM disease. 3. LCX: significant stenosis due to LM disease 4. RCA: significant stenosis in mid RCA FFRct 0.73. IMPRESSION: 1. CT FFR analysis showed significant stenosis in ostial LM. FFRct 0.54 2.  Significant stenosis in mid RCA,  FFRct 0.73. 3.  Cardiac catheterization recommended.  Electronically Signed   By: Debbe Odea M.D.   On: 03/18/2023 11:20   CT CORONARY MORPH W/CTA COR W/SCORE W/CA W/CM &/OR WO/CM  Result Date: 03/17/2023 CLINICAL DATA:  Chest pain EXAM: Cardiac/Coronary  CTA TECHNIQUE: The patient was scanned on a Siemens Somatom go.Top scanner. : A retrospective scan was triggered in the ascending thoracic aorta. Axial non-contrast 3 mm slices were carried out through the heart. The data set was analyzed on a dedicated work station and scored using the Agatson method. Gantry rotation speed was 330 msecs and collimation was .6 mm. 100mg  of metoprolol and 0.8 mg of sl NTG was given. The 3D data set was reconstructed in 5% intervals of the 60-95 % of the R-R cycle. Diastolic phases were analyzed on a dedicated work station using MPR, MIP and VRT modes. The patient received 75 cc of contrast. FINDINGS: Aorta:  Normal size.  Aortic wall calcifications.  No dissection. Aortic Valve:  Trileaflet.  No calcifications. Coronary Arteries:  Normal coronary origin.  Right dominance. RCA is a dominant artery. There is calcified plaque throughout the RCA causing mild stenosis (25-49%). Left main gives rise to LAD and LCX arteries. LM has calcified and non calcified  plaque proximally causing severe stenosis (>70%). LAD has calcified plaque proximally causing mild stenosis (25-49%). LCX is a non-dominant artery. There is calcified plaque in the proximal LCx/OM1 causing moderate stenosis (50-69%) Other findings: Normal pulmonary vein drainage into the left atrium. Normal left atrial appendage without a thrombus. Normal size of the pulmonary artery. IMPRESSION: 1. Coronary calcium score of 1175. This was 97th percentile for age and sex matched control. 2. Normal coronary origin with right dominance. 3. Severe proximal LM stenosis (>70%). 4. Moderate proximal LCx/OM1 stenosis (50-69%). 5. Mild RCA stenosis (25-49%). 6. CAD-RADS 4 Severe stenosis. (70-99% or > 50% left main). Cardiac catheterization is recommended. 7. Additional analysis with CT FFR will be submitted and reported separately. Electronically Signed   By: Debbe Odea M.D.   On: 03/17/2023 16:51   ECHOCARDIOGRAM COMPLETE  Result Date: 03/17/2023    ECHOCARDIOGRAM REPORT   Patient Name:   COBIE RAJKUMAR Date of Exam: 03/17/2023 Medical Rec #:  [id number]         Height:       66.0 in Accession #:    [id number]        Weight:       175.6 lb Date of Birth:  July 21, 1960         BSA:          1.892 m Patient Age:    62 years          BP:           148/62 mmHg Patient Gender: M                 HR:           63 bpm. Exam Location:  [place] Procedure: 2D Echo, 3D Echo, Cardiac Doppler, Color Doppler and Strain Analysis Indications:    R07.9* Chest pain, unspecified  History:        Patient has no prior history of Echocardiogram examinations.                 CAD, TIA, Signs/Symptoms:Chest Pain; Risk Factors:Hypertension                 and Dyslipidemia.  Sonographer:    Ilda Mori MHA, BS, RDCS Referring Phys: [id number] BRIAN AGBOR-ETANG IMPRESSIONS  1.  Left ventricular ejection fraction, by estimation, is 60 to 65%. The left ventricle has normal function. The left ventricle has no regional wall motion  abnormalities. Left ventricular diastolic parameters are consistent with Grade I diastolic dysfunction (impaired relaxation). The average left ventricular global longitudinal strain is -21.8 %.  2. Right ventricular systolic function is normal. The right ventricular size is normal. There is normal pulmonary artery systolic pressure. The estimated right ventricular systolic pressure is 32.9 mmHg.  3. Left atrial size was mild to moderately dilated.  4. The mitral valve is normal in structure. Mild mitral valve regurgitation. No evidence of mitral stenosis.  5. Tricuspid valve regurgitation is mild to moderate.  6. The aortic valve is tricuspid. There is mild calcification of the aortic valve. Aortic valve regurgitation is moderate. No aortic stenosis is present.  7. The inferior vena cava is normal in size with greater than 50% respiratory variability, suggesting right atrial pressure of 3 mmHg. FINDINGS  Left Ventricle: Left ventricular ejection fraction, by estimation, is 60 to 65%. The left ventricle has normal function. The left ventricle has no regional wall motion abnormalities. The average left ventricular global longitudinal strain is -21.8 %. The left ventricular internal cavity size was normal in size. There is no left ventricular hypertrophy. Left ventricular diastolic parameters are consistent with Grade I diastolic dysfunction (impaired relaxation). Right Ventricle: The right ventricular size is normal. No increase in right ventricular wall thickness. Right ventricular systolic function is normal. There is normal pulmonary artery systolic pressure. The tricuspid regurgitant velocity is 2.64 m/s, and  with an assumed right atrial pressure of 5 mmHg, the estimated right ventricular systolic pressure is 32.9 mmHg. Left Atrium: Left atrial size was mild to moderately dilated. Right Atrium: Right atrial size was normal in size. Pericardium: There is no evidence of pericardial effusion. Mitral Valve: The  mitral valve is normal in structure. Mild mitral valve regurgitation. No evidence of mitral valve stenosis. Tricuspid Valve: The tricuspid valve is normal in structure. Tricuspid valve regurgitation is mild to moderate. No evidence of tricuspid stenosis. Aortic Valve: The aortic valve is tricuspid. There is mild calcification of the aortic valve. Aortic valve regurgitation is moderate. Aortic regurgitation PHT measures 425 msec. No aortic stenosis is present. Pulmonic Valve: The pulmonic valve was normal in structure. Pulmonic valve regurgitation is mild. No evidence of pulmonic stenosis. Aorta: The aortic root is normal in size and structure. Venous: The inferior vena cava is normal in size with greater than 50% respiratory variability, suggesting right atrial pressure of 3 mmHg. IAS/Shunts: No atrial level shunt detected by color flow Doppler.  LEFT VENTRICLE PLAX 2D LVIDd:         5.00 cm Diastology LVIDs:         3.00 cm LV e' medial:    8.27 cm/s LV PW:         1.00 cm LV E/e' medial:  12.3 LV IVS:        1.00 cm LV e' lateral:   9.90 cm/s                        LV E/e' lateral: 10.3                         2D Longitudinal Strain                        2D Strain GLS Avg:     -  21.8 %                         3D Volume EF:                        3D EF:        63 %                        LV EDV:       163 ml                        LV ESV:       60 ml                        LV SV:        102 ml RIGHT VENTRICLE RV Basal diam:  3.70 cm RV Mid diam:    3.20 cm RV S prime:     20.20 cm/s TAPSE (M-mode): 1.7 cm LEFT ATRIUM             Index        RIGHT ATRIUM           Index LA diam:        4.30 cm 2.27 cm/m   RA Area:     20.80 cm LA Vol (A2C):   87.0 ml 45.98 ml/m  RA Volume:   58.90 ml  31.13 ml/m LA Vol (A4C):   61.1 ml 32.29 ml/m LA Biplane Vol: 75.0 ml 39.64 ml/m  AORTIC VALVE AI PHT:      425 msec  AORTA Ao Root diam: 3.40 cm Ao Asc diam:  3.10 cm MITRAL VALVE                TRICUSPID VALVE MV Area (PHT):  4.06 cm     TR Peak grad:   27.9 mmHg MV Decel Time: 187 msec     TR Vmax:        264.00 cm/s MV E velocity: 102.00 cm/s MV A velocity: 87.50 cm/s MV E/A ratio:  1.17 Julien Nordmann MD Electronically signed by Julien Nordmann MD Signature Date/Time: 03/17/2023/4:04:49 PM    Final      Data Reviewed: Relevant notes from primary care and specialist visits, past discharge summaries as available in EHR, including Care Everywhere. Prior diagnostic testing as pertinent to current admission diagnoses Updated medications and problem lists for reconciliation ED course, including vitals, labs, imaging, treatment and response to treatment Triage notes, nursing and pharmacy notes and ED provider's notes Notable results as noted in HPI  Assessment & Plan Unstable angina [facility]) Patient presenting with midsternal chest pain radiating to the left side similar to his previous episode of chest pain and heart disease.  Currently patient is chest pain-free states he has not had chest pain since being admitted.  Will continue patient on aspirin atorvastatin.  Nitroglycerin.  Troponin so far negative.  Cardiology consulted. History of CAD (coronary artery disease) Continue antiplatelet therapy with aspirin and atorvastatin.  Continue metoprolol, Essential hypertension Vitals:   03/18/23 1557 03/18/23 1715 03/18/23 1830 03/18/23 1900  BP: (!) 182/71 (!) 140/102 138/62 (!) 144/57   03/18/23 1930 03/18/23 2026 03/18/23 2041 03/19/23 0006  BP: 138/64 (!) 154/70 (!) 163/68 (!) 123/52  Patient started on current hydralazine and will continue home amlodipine regimen.  Hypothyroidism Continue  patient's levothyroxine 137. Hx of transient ischemic attack (TIA) Continue aspirin and statin therapy.  Lipid panel ordered. Tobacco abuse Nicotine patch, counseling on tobacco cessation offered.   DVT prophylaxis:  Heparin   Consults:  Cardiology  [facility]: Dr Mariah Milling.  Advance Care Planning:    Code Status: Prior    Family Communication:  None  Disposition Plan:  None  Severity of Illness: The appropriate patient status for this patient is INPATIENT. Inpatient status is judged to be reasonable and necessary in order to provide the required intensity of service to ensure the patient's safety. The patient's presenting symptoms, physical exam findings, and initial radiographic and laboratory data in the context of their chronic comorbidities is felt to place them at high risk for further clinical deterioration. Furthermore, it is not anticipated that the patient will be medically stable for discharge from the hospital within 2 midnights of admission.   * I certify that at the point of admission it is my clinical judgment that the patient will require inpatient hospital care spanning beyond 2 midnights from the point of admission due to high intensity of service, high risk for further deterioration and high frequency of surveillance required.*  Author: Gertha Calkin, MD 03/18/2023 7:48 PM  For on call review [email or web address].

## 2023-03-18 NOTE — Progress Notes (Signed)
[name] [phone] [name] [phone]

## 2023-03-18 NOTE — Patient Instructions (Signed)
 Medication Instructions:  Your physician recommends that you continue on your current medications as directed. Please refer to the Current Medication list given to you today.   *If you need a refill on your cardiac medications before your next appointment, please call your pharmacy*   Lab Work: No labs ordered today    Testing/Procedures: No test ordered today    Follow-Up: At [facility], you and your health needs are our priority.  As part of our continuing mission to provide you with exceptional heart care, we have created designated Provider Care Teams.  These Care Teams include your primary Cardiologist (physician) and Advanced Practice Providers (APPs -  Physician Assistants and Nurse Practitioners) who all work together to provide you with the care you need, when you need it.  We recommend signing up for the patient portal called "MyChart".  Sign up information is provided on this After Visit Summary.  MyChart is used to connect with patients for Virtual Visits (Telemedicine).  Patients are able to view lab/test results, encounter notes, upcoming appointments, etc.  Non-urgent messages can be sent to your provider as well.   To learn more about what you can do with MyChart, go to [email or web address].    Your next appointment:   Based on hospitalization

## 2023-03-18 NOTE — ED Notes (Signed)
 CCMD called to monitor patient.

## 2023-03-18 NOTE — ED Provider Notes (Signed)
[facility] Provider Note    Event Date/Time   First MD Initiated Contact with Patient 03/18/23 1714     (approximate)   History   Chest Pain   HPI  [name] is a 62 y.o. male who comes in from cardiology clinic with concern for chest pain.  Patient has been having increasing exertional chest pain.  Once he stops it goes away patient had a cardiac CTA showing left main disease.  His EKG at the office was reassuring.  Patient denies any rectal bleeding falls and hit his head shortness of breath or any other concerns.  At rest he reports that he feels at his baseline self.   Physical Exam   Triage Vital Signs: ED Triage Vitals  Encounter Vitals Group     BP 03/18/23 1557 (!) 182/71     Systolic BP Percentile --      Diastolic BP Percentile --      Pulse Rate 03/18/23 1557 88     Resp 03/18/23 1557 18     Temp 03/18/23 1557 98.1 F (36.7 C)     Temp src --      SpO2 03/18/23 1557 99 %     Weight 03/18/23 1556 173 lb (78.5 kg)     Height 03/18/23 1556 5\' 6"  (1.676 m)     Head Circumference --      Peak Flow --      Pain Score 03/18/23 1556 0     Pain Loc --      Pain Education --      Exclude from Growth Chart --     Most recent vital signs: Vitals:   03/18/23 1557 03/18/23 1715  BP: (!) 182/71 (!) 140/102  Pulse: 88 72  Resp: 18 19  Temp: 98.1 F (36.7 C)   SpO2: 99% 100%     General: Awake, no distress.  CV:  Good peripheral perfusion.  Resp:  Normal effort.  Abd:  No distention.  Other:  No swelling in legs.  No calf tenderness   ED Results / Procedures / Treatments   Labs (all labs ordered are listed, but only abnormal results are displayed) Labs Reviewed  CBC WITH DIFFERENTIAL/PLATELET  BASIC METABOLIC PANEL  TROPONIN I (HIGH SENSITIVITY)     EKG  My interpretation of EKG:  Normal sinus rhythm 84 without any ST elevation or T wave inversions, normal intervals  RADIOLOGY I have reviewed the xray  personally and interpreted no signs of any pneumonia  PROCEDURES:  Critical Care performed: No  .1-3 Lead EKG Interpretation  Performed by: Concha Se, MD Authorized by: Concha Se, MD     Interpretation: normal     ECG rate:  70   ECG rate assessment: normal     Rhythm: sinus rhythm     Ectopy: none     Conduction: normal      MEDICATIONS ORDERED IN ED: Medications  aspirin chewable tablet 324 mg (has no administration in time range)     IMPRESSION / MDM / ASSESSMENT AND PLAN / ED COURSE  I reviewed the triage vital signs and the nursing notes.   Patient's presentation is most consistent with acute presentation with potential threat to life or bodily function.   Patient comes in with exertional chest pain with abnormal CT cardiac plan is for cardiac catheterization.  Did alert Dr. Mariah Milling from cardiology.  He is got no symptoms at this time.  Doubt PE.  No  rectal bleeding to consider anemia.  Initial troponin is negative.  Will give patient aspirin will hold off on heparin.  Will discuss the hospital team for admission.  The patient is on the cardiac monitor to evaluate for evidence of arrhythmia and/or significant heart rate changes.      FINAL CLINICAL IMPRESSION(S) / ED DIAGNOSES   Final diagnoses:  Stable angina (HCC)     Rx / DC Orders   ED Discharge Orders     None        Note:  This document was prepared using Dragon voice recognition software and may include unintentional dictation errors.   Concha Se, MD 03/18/23 [phone]

## 2023-03-18 NOTE — ED Triage Notes (Signed)
Pt comes with c/o cp that started few months ago. Pt states it got worse today. Pt was at cardiologist office and did EKG. Pt had artery block so they sent here.   Pt states left sided below rib pain.

## 2023-03-18 NOTE — Progress Notes (Signed)
Cardiology Clinic Note   Date: 03/18/2023 ID: [name], DOB [birth year], MRN [id number]  Primary Cardiologist:  Debbe Odea, MD  Patient Profile    [name] is a 62 y.o. male who presents to the clinic today for follow up after testing.     Past medical history significant for: Chest pain. Coronary CTA 03/17/2023: Coronary calcium score 1175 (97th percentile).  Severe proximal LM stenosis, moderate proximal LCx/OM1 stenosis.  Mild RCA stenosis.  FFR showed significant stenosis in ostial LM, significant stenosis in mid RCA. Echo 03/17/2023: EF 60 to 65%.  No RWMA.  Grade I DD.  Normal RV size/function.  Normal PA pressure.  Mild to moderate LAE.  Mild MR.  Mild to moderate TR.  Moderate AI.  Mild aortic valve calcification without stenosis. PAD.  Carotid artery stenosis.  Stent placement right ICA 10/22/2020.  Stent placement left CCA 12/17/2020.  Carotid duplex 01/24/2023: Bilateral ICA 40-59%. Non-hemodynamically significant plaque <50% bilateral CCA.  Hyperlipidemia.  Lipid panel 02/25/2023: LDL 40, HDL 33, TG 103, total 93. Right eye amaurosis fugax.  Tobacco abuse.   In summary, patient was previously seen by [facility] cardiology over 5 years ago. He was first evaluated by Dr. Azucena Cecil on 02/25/2023 for 67-month history of chest pain exacerbated by exertion.  Pain will sometimes radiate to the left arm and neck area.  Patient underwent ischemic evaluation with coronary CTA as outlined above.     History of Present Illness    [name] is followed by Dr. Azucena Cecil for the above outlined history.   Today, patient  is here alone. He reports chest pain walking into the building that resolved with rest. He continues to have episodic left-sided chest pain with exertion such as walking or working. If he does heavier exertional activities the pain will radiate up to his neck and down his left arm.  He reports it does not last long because as soon as it comes on he  stops what he is doing and it goes away within a few minutes but it returns when he exerts himself again. He will usually have mild lightheadedness preceding chest pain. He works in [employer]. Discussed results of coronary CTA with the patient. Given the LM disease confirmed with FFR and episodic chest pain will send patient to ED.     ROS: All other systems reviewed and are otherwise negative except as noted in History of Present Illness.  Studies Reviewed    EKG Interpretation Date/Time:  Friday March 18 2023 15:07:47 EST Ventricular Rate:  71 PR Interval:  134 QRS Duration:  88 QT Interval:  380 QTC Calculation: 412 R Axis:   5  Text Interpretation: Normal sinus rhythm Normal ECG When compared with ECG of 25-Feb-2023 09:52, No significant change was found Confirmed by Carlos Levering [phone]) on 03/18/2023 3:14:10 PM         Physical Exam    VS:  BP (!) 150/68 (BP Location: Left Arm, Patient Position: Sitting, Cuff Size: Normal)   Pulse 71   Ht 5\' 6"  (1.676 m)   Wt 177 lb (80.3 kg)   SpO2 98%   BMI 28.57 kg/m  , BMI Body mass index is 28.57 kg/m.  GEN: Well nourished, well developed, in no acute distress. Neck: No JVD or carotid bruits. Cardiac:  RRR. No murmurs. No rubs or gallops.   Respiratory:  Respirations regular and unlabored. Clear to auscultation without rales, wheezing or rhonchi. GI: Soft, nontender, nondistended. Extremities: Radials/DP/PT 2+  and equal bilaterally. No clubbing or cyanosis. No edema.  Skin: Warm and dry, no rash. Neuro: Strength intact.  Assessment & Plan   CAD Coronary CTA 03/17/2023 showed severe proximal LM stenosis, moderate proximal LCx/OM 1 stenosis, mild RCA.  FFR showed significant stenosis of ostial LM, mid RCA.  Patient reports chest pain walking into the office that resolved with rest. 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 It does not last long, as he immediately stops what he is doing when it comes on. With heavier exertion pain will radiate up to left neck and down left arm. He reports lightheadedness typically precedes his chest pain. EKG is without acute changes.  -Given his continued episodes of chest pain and LM disease found on CTA will send patient to ED.   Carotid artery stenosis S/p stent placement right ICA July 2022, left CCA September 2022.  Carotid duplex October 2024 showed bilateral ICA 40 to 59%, nonhemodynamically significant plaque <50% bilateral CCA.  Patient denies dizziness, presyncope, syncope, amaurosis fugax. -Continue aspirin, atorvastatin. -Followed by vascular surgery.  Hypertension BP today 150/68.  -Continue amlodipine, hydrochlorothiazide, lisinopril.  Hyperlipidemia LDL November 2024 40, at goal. -Continue atorvastatin.  Disposition: Patient sent to ED for further monitoring and evaluation secondary to chest pain and LM disease seen on coronary CTA and confirmed with FFR.          Signed, Etta Grandchild. Chenika Nevils, DNP, NP-C

## 2023-03-18 NOTE — ED Provider Triage Note (Signed)
Emergency Medicine Provider Triage Evaluation Note  [name] , a 62 y.o. male  was evaluated in triage.  Pt complains of Chest pain for a few months. Went to cardiologist today and was sent over due to EKG showing a blocked artery. Cardiologist was planning on doing a heart cath and discussing scheduling with the patient, however when they saw the EKG they sent him here.  Review of Systems  Positive:  Negative: CP, SOB, dizziness, sweating, weakness  Physical Exam  There were no vitals taken for this visit. Gen:   Awake, no distress   Resp:  Normal effort  MSK:   Moves extremities without difficulty  Other:    Medical Decision Making  Medically screening exam initiated at 3:56 PM.  Appropriate orders placed.  [name] was informed that the remainder of the evaluation will be completed by another provider, this initial triage assessment does not replace that evaluation, and the importance of remaining in the ED until their evaluation is complete.    James Ali, PA-C 03/18/23 [phone]

## 2023-03-19 DIAGNOSIS — I6529 Occlusion and stenosis of unspecified carotid artery: Secondary | ICD-10-CM

## 2023-03-19 DIAGNOSIS — E7800 Pure hypercholesterolemia, unspecified: Secondary | ICD-10-CM

## 2023-03-19 DIAGNOSIS — I25110 Atherosclerotic heart disease of native coronary artery with unstable angina pectoris: Secondary | ICD-10-CM

## 2023-03-19 DIAGNOSIS — I10 Essential (primary) hypertension: Secondary | ICD-10-CM | POA: Diagnosis not present

## 2023-03-19 DIAGNOSIS — I200 Unstable angina: Secondary | ICD-10-CM | POA: Diagnosis not present

## 2023-03-19 LAB — HIV ANTIBODY (ROUTINE TESTING W REFLEX): HIV Screen 4th Generation wRfx: NONREACTIVE

## 2023-03-19 LAB — TSH: TSH: 0.249 u[IU]/mL — ABNORMAL LOW (ref 0.350–4.500)

## 2023-03-19 LAB — MAGNESIUM: Magnesium: 2.3 mg/dL (ref 1.7–2.4)

## 2023-03-19 LAB — T4, FREE: Free T4: 1.2 ng/dL — ABNORMAL HIGH (ref 0.61–1.12)

## 2023-03-19 MED ORDER — PANTOPRAZOLE SODIUM 40 MG PO TBEC
40.0000 mg | DELAYED_RELEASE_TABLET | Freq: Every day | ORAL | Status: DC
  Administered 2023-03-19 – 2023-03-22 (×3): 40 mg via ORAL
  Filled 2023-03-19 (×3): qty 1

## 2023-03-19 MED ORDER — LEVOTHYROXINE SODIUM 137 MCG PO TABS
137.0000 ug | ORAL_TABLET | Freq: Every day | ORAL | Status: DC
  Administered 2023-03-19: 137 ug via ORAL
  Filled 2023-03-19: qty 1

## 2023-03-19 MED ORDER — AMLODIPINE BESYLATE 5 MG PO TABS
5.0000 mg | ORAL_TABLET | Freq: Every day | ORAL | Status: DC
  Administered 2023-03-19 – 2023-03-22 (×4): 5 mg via ORAL
  Filled 2023-03-19 (×4): qty 1

## 2023-03-19 MED ORDER — ORAL CARE MOUTH RINSE: 15.0000 mL | OROMUCOSAL | Status: DC | PRN

## 2023-03-19 MED ORDER — METOPROLOL TARTRATE 50 MG PO TABS: 100.0000 mg | ORAL_TABLET | Freq: Once | ORAL | Status: DC

## 2023-03-19 MED ORDER — LEVOTHYROXINE SODIUM 125 MCG PO TABS
125.0000 ug | ORAL_TABLET | Freq: Every day | ORAL | Status: DC
  Administered 2023-03-20 – 2023-03-22 (×3): 125 ug via ORAL
  Filled 2023-03-19 (×4): qty 1

## 2023-03-19 MED ORDER — HYDRALAZINE HCL 20 MG/ML IJ SOLN: 10.0000 mg | Freq: Four times a day (QID) | INTRAMUSCULAR | Status: DC | PRN

## 2023-03-19 MED ORDER — ASPIRIN 81 MG PO CHEW
81.0000 mg | CHEWABLE_TABLET | Freq: Every day | ORAL | Status: DC
  Administered 2023-03-19 – 2023-03-20 (×2): 81 mg via ORAL
  Filled 2023-03-19 (×2): qty 1

## 2023-03-19 NOTE — Consult Note (Signed)
Cardiology Consultation   Patient ID: [name] MRN: [id number]; DOB: Jun 11, 1960  Admit date: 03/18/2023 Date of Consult: 03/19/2023  PCP:  Lorn Junes, FNP (Inactive)   [facility] Providers Cardiologist:  Debbe Odea, MD   {   Patient Profile:   [name] is a 62 y.o. male with a hx of CAD, PAD, carotid artery stenosis status post right ICA in July 2022 and left CCA stent in September 2022, hyperlipidemia, hypertension, TIA, right eye amaurosis fugax, hypothyroidism, tobacco abuse who is being seen 03/19/2023 for the evaluation of unstable angina at the request of Dr. Allena Katz.  History of Present Illness:   [name] underwent coronary CTA in December 2024 which showed a coronary calcium score of 1175, 97 percentile, severe proximal LM stenosis, moderate proximal left circumflex/OM1 stenosis, mild RCA stenosis.  FFR was significant and ostial left main, significant stenosis in mid RCA.  Patient was seen in the office 03/18/2023 reporting persistent unstable angina and he was sent to the ER.  Echo from March 17, 2023 showed EF of 60 to 65%, no wall motion abnormality, grade 1 diastolic dysfunction, normal RV size and function, mild to moderate LAE, mild MR, mild to moderate TR, moderate AI, mild aortic valve calcification without stenosis.  The patient presented to the ER from the cardiology office on 03/18/2023 and was admitted for further workup. In the ER blood pressure 182/71, pulse rate 88, respiratory rate 18, afebrile, 99% O2.  EKG showed normal sinus rhythm, 84 bpm without ST/T wave changes.  Labs showed serum creatinine 1.50/BUN 31, WBC 11.4, hemoglobin 12.2. CXR nonacute.   Past Medical History:  Diagnosis Date   Arthritis    Carotid artery disease (HCC)    seen by [facility]   Coronary artery disease    Hx of transient ischemic attack (TIA)    Hyperlipidemia    Hypertension    Thyroid disease    hypothyrodism     Past Surgical History:  Procedure Laterality Date   BACK SURGERY     CAROTID PTA/STENT INTERVENTION Right 10/22/2020   Procedure: CAROTID PTA/STENT INTERVENTION;  Surgeon: Renford Dills, MD;  Location: [facility] INVASIVE CV [facility];  Service: Cardiovascular;  Laterality: Right;   CAROTID PTA/STENT INTERVENTION Left 12/17/2020   Procedure: CAROTID PTA/STENT INTERVENTION;  Surgeon: Renford Dills, MD;  Location: [facility] INVASIVE CV LAB;  Service: Cardiovascular;  Laterality: Left;   CATARACT EXTRACTION W/PHACO Right 08/18/2022   Procedure: CATARACT EXTRACTION PHACO AND INTRAOCULAR LENS PLACEMENT (IOC) RIGHT MALYUGIN 4.19 00:24.5;  Surgeon: Lockie Mola, MD;  Location: [facility];  Service: Ophthalmology;  Laterality: Right;     Home Medications:  Prior to Admission medications   Medication Sig Start Date End Date Taking? Authorizing Provider  acetaminophen (TYLENOL) 500 MG tablet Take 1,000 mg by mouth every 6 (six) hours as needed for mild pain. 11/12/20   [provider]  amLODipine (NORVASC) 5 MG tablet Take 5 mg by mouth daily. 01/04/23   [provider]  aspirin EC 81 MG EC tablet Take 1 tablet (81 mg total) by mouth at bedtime. Swallow whole. 12/18/20   Schnier, Latina Craver, MD  atorvastatin (LIPITOR) 40 MG tablet Take 40 mg by mouth at bedtime. 08/07/20   [provider]  hydrochlorothiazide (HYDRODIURIL) 25 MG tablet Take 25 mg by mouth at bedtime. 07/31/20   [provider]  levothyroxine (SYNTHROID) 137 MCG tablet Take 137 mcg by mouth at bedtime.    [provider]  lisinopril (ZESTRIL) 20 MG tablet Take 20 mg by mouth at bedtime. 12/09/20   [provider]  metoprolol tartrate (LOPRESSOR) 100 MG tablet Take 1 tablet (100 mg total) by mouth once for 1 dose. TAKE TWO HOURS PRIOR TO CARDIAC CTA 02/25/23 02/25/23  Debbe Odea, MD  nitroGLYCERIN (NITROSTAT) 0.4 MG SL tablet Place 1 tablet (0.4 mg total) under the tongue  every 5 (five) minutes as needed for chest pain. 02/25/23 05/26/23  Debbe Odea, MD    Inpatient Medications: Scheduled Meds:  amLODipine  5 mg Oral Daily   aspirin  81 mg Oral Daily   atorvastatin  20 mg Oral Daily   heparin  5,000 Units Subcutaneous Q8H   levothyroxine  137 mcg Oral Q0600   metoprolol tartrate  100 mg Oral Once   nicotine  21 mg Transdermal Daily   pantoprazole (PROTONIX) IV  40 mg Intravenous Q12H   Continuous Infusions:  sodium chloride 20 mL/hr at 03/18/23 2154   PRN Meds: acetaminophen, hydrALAZINE, nitroGLYCERIN, ondansetron (ZOFRAN) IV  Allergies:   No Known Allergies  Social History:   Social History   Socioeconomic History   Marital status: Married    Spouse name: Not on file   Number of children: Not on file   Years of education: Not on file   Highest education level: Not on file  Occupational History   Not on file  Tobacco Use   Smoking status: Every Day    Current packs/day: 1.00    Average packs/day: 1 pack/day for 47.0 years (47.0 ttl pk-yrs)    Types: Cigarettes   Smokeless tobacco: Never  Vaping Use   Vaping status: Never Used  Substance and Sexual Activity   Alcohol use: Never   Drug use: Never   Sexual activity: Not on file  Other Topics Concern   Not on file  Social History Narrative   Not on file   Social Determinants of Health   Financial Resource Strain: Not on file  Food Insecurity: No Food Insecurity (03/18/2023)   Hunger Vital Sign    Worried About Running Out of Food in the Last Year: Never true    Ran Out of Food in the Last Year: Never true  Transportation Needs: No Transportation Needs (03/18/2023)   PRAPARE - Administrator, Civil Service (Medical): No    Lack of Transportation (Non-Medical): No  Physical Activity: Not on file  Stress: Not on file  Social Connections: Not on file  Intimate Partner Violence: Not At Risk (03/18/2023)   Humiliation, Afraid, Rape, and Kick questionnaire    Fear  of Current or Ex-Partner: No    Emotionally Abused: No    Physically Abused: No    Sexually Abused: No    Family History:    Family History  Problem Relation Age of Onset   Hypothyroidism Mother    Diabetes Mother    Heart attack Mother    Cancer Father    Heart disease Sister    Hypothyroidism Sister    Heart disease Brother    Hypothyroidism Brother      ROS:  Please see the history of present illness.   All other ROS reviewed and negative.     Physical Exam/Data:   Vitals:   03/18/23 2026 03/18/23 2041 03/19/23 0006 03/19/23 0356  BP: (!) 154/70 (!) 163/68 (!) 123/52 (!) 137/56  Pulse:  82 61 65  Resp: (!) 22 18 18 20   Temp: 98.7 F (37.1 C)  98.6 F (37 C) 98.4 F (36.9 C) 98.3 F (36.8 C)  TempSrc: Oral     SpO2: 100% 100% 98% 97%  Weight:      Height:       No intake or output data in the 24 hours ending 03/19/23 0735    03/18/2023    3:56 PM 03/18/2023    3:01 PM 02/25/2023    9:48 AM  Last 3 Weights  Weight (lbs) 173 lb 177 lb 175 lb 9.6 oz  Weight (kg) 78.472 kg 80.287 kg 79.652 kg     Body mass index is 27.92 kg/m.  General:  Well nourished, well developed, in no acute distress HEENT: normal Neck: no JVD Vascular: No carotid bruits; Distal pulses 2+ bilaterally Cardiac:  normal S1, S2; RRR; + murmur  Lungs:  clear to auscultation bilaterally, no wheezing, rhonchi or rales  Abd: soft, nontender, no hepatomegaly  Ext: no edema Musculoskeletal:  No deformities, BUE and BLE strength normal and equal Skin: warm and dry  Neuro:  CNs 2-12 intact, no focal abnormalities noted Psych:  Normal affect   EKG:  The EKG was personally reviewed and demonstrates:  NSR 71 bpm, no ST/T wave changes Telemetry:  Telemetry was personally reviewed and demonstrates:  SB/SR HR 50-60s  Relevant CV Studies:  Coronary CT 03/17/23  IMPRESSION: 1. Coronary calcium score of 1175. This was 97th percentile for age and sex matched control.   2. Normal coronary origin  with right dominance.   3. Severe proximal LM stenosis (>70%).   4. Moderate proximal LCx/OM1 stenosis (50-69%).   5. Mild RCA stenosis (25-49%).   6. CAD-RADS 4 Severe stenosis. (70-99% or > 50% left main). Cardiac catheterization is recommended.   7. Additional analysis with CT FFR will be submitted and reported separately.     IMPRESSION: 1. CT FFR analysis showed significant stenosis in ostial LM. FFRct 0.54   2.  Significant stenosis in mid RCA,  FFRct 0.73.   3.  Cardiac catheterization recommended.     Electronically Signed   By: Debbe Odea M.D.   On: 03/18/2023 11:20  Echo 03/17/23 1. Left ventricular ejection fraction, by estimation, is 60 to 65%. The  left ventricle has normal function. The left ventricle has no regional  wall motion abnormalities. Left ventricular diastolic parameters are  consistent with Grade I diastolic  dysfunction (impaired relaxation). The average left ventricular global  longitudinal strain is -21.8 %.   2. Right ventricular systolic function is normal. The right ventricular  size is normal. There is normal pulmonary artery systolic pressure. The  estimated right ventricular systolic pressure is 32.9 mmHg.   3. Left atrial size was mild to moderately dilated.   4. The mitral valve is normal in structure. Mild mitral valve  regurgitation. No evidence of mitral stenosis.   5. Tricuspid valve regurgitation is mild to moderate.   6. The aortic valve is tricuspid. There is mild calcification of the  aortic valve. Aortic valve regurgitation is moderate. No aortic stenosis  is present.   7. The inferior vena cava is normal in size with greater than 50%  respiratory variability, suggesting right atrial pressure of 3 mmHg.    Laboratory Data:  High Sensitivity Troponin:   Recent Labs  Lab 03/18/23 1600 03/18/23 1934  TROPONINIHS 6 8     Chemistry Recent Labs  Lab 03/18/23 1600 03/18/23 1932  NA 142  --   K 4.0  --   CL  112*  --  CO2 22  --   GLUCOSE 91  --   BUN 31*  --   CREATININE 1.50*  --   CALCIUM 9.5  --   MG  --  2.3  GFRNONAA 53*  --   ANIONGAP 8  --     No results for input(s): "PROT", "ALBUMIN", "AST", "ALT", "ALKPHOS", "BILITOT" in the last 168 hours. Lipids  Recent Labs  Lab 03/18/23 1932  CHOL 93  TRIG 83  HDL 38*  LDLCALC 38  CHOLHDL 2.4    Hematology Recent Labs  Lab 03/18/23 1600  WBC 11.4*  RBC 3.75*  HGB 12.2*  HCT 36.2*  MCV 96.5  MCH 32.5  MCHC 33.7  RDW 13.4  PLT 275   Thyroid  Recent Labs  Lab 03/18/23 1932  TSH 0.249*  FREET4 1.20*    BNPNo results for input(s): "BNP", "PROBNP" in the last 168 hours.  DDimer No results for input(s): "DDIMER" in the last 168 hours.   Radiology/Studies:  DG Chest Portable 1 View  Result Date: 03/18/2023 CLINICAL DATA:  Chest pain for the past few months got worse today. EXAM: PORTABLE CHEST 1 VIEW COMPARISON:  Cardiac/coronary CTA dated 03/17/2023 and chest radiographs dated 12/27/2012. FINDINGS: Mildly enlarged cardiac silhouette. Clear lungs with normal vascularity. No pleural fluid. Mild left and minimal right glenohumeral degenerative changes. IMPRESSION: 1. Mild cardiomegaly. 2. No acute abnormality. Electronically Signed   By: Beckie Salts M.D.   On: 03/18/2023 18:34   CT CORONARY FFR DATA PREP & FLUID ANALYSIS  Result Date: 03/18/2023 EXAM: CT FFR ANALYSIS CLINICAL DATA:  Abnormal CCTA FINDINGS: FFRct analysis was performed on the original cardiac CT angiogram dataset. Diagrammatic representation of the FFRct analysis is provided in a separate PDF document in PACS. This dictation was created using the PDF document and an interactive 3D model of the results. 3D model is not available in the EMR/PACS. Normal FFR range is >0.80. 1. Left Main:  significant stenosis in ostial LM.  FFRct 0.54 2. LAD: significant stenosis due to LM disease. 3. LCX: significant stenosis due to LM disease 4. RCA: significant stenosis in mid  RCA FFRct 0.73. IMPRESSION: 1. CT FFR analysis showed significant stenosis in ostial LM. FFRct 0.54 2.  Significant stenosis in mid RCA,  FFRct 0.73. 3.  Cardiac catheterization recommended. Electronically Signed   By: Debbe Odea M.D.   On: 03/18/2023 11:20   CT CORONARY MORPH W/CTA COR W/SCORE W/CA W/CM &/OR WO/CM  Result Date: 03/17/2023 CLINICAL DATA:  Chest pain EXAM: Cardiac/Coronary  CTA TECHNIQUE: The patient was scanned on a Siemens Somatom go.Top scanner. : A retrospective scan was triggered in the ascending thoracic aorta. Axial non-contrast 3 mm slices were carried out through the heart. The data set was analyzed on a dedicated work station and scored using the Agatson method. Gantry rotation speed was 330 msecs and collimation was .6 mm. 100mg  of metoprolol and 0.8 mg of sl NTG was given. The 3D data set was reconstructed in 5% intervals of the 60-95 % of the R-R cycle. Diastolic phases were analyzed on a dedicated work station using MPR, MIP and VRT modes. The patient received 75 cc of contrast. FINDINGS: Aorta:  Normal size.  Aortic wall calcifications.  No dissection. Aortic Valve:  Trileaflet.  No calcifications. Coronary Arteries:  Normal coronary origin.  Right dominance. RCA is a dominant artery. There is calcified plaque throughout the RCA causing mild stenosis (25-49%). Left main gives rise to LAD and LCX arteries. LM  has calcified and non calcified plaque proximally causing severe stenosis (>70%). LAD has calcified plaque proximally causing mild stenosis (25-49%). LCX is a non-dominant artery. There is calcified plaque in the proximal LCx/OM1 causing moderate stenosis (50-69%) Other findings: Normal pulmonary vein drainage into the left atrium. Normal left atrial appendage without a thrombus. Normal size of the pulmonary artery. IMPRESSION: 1. Coronary calcium score of 1175. This was 97th percentile for age and sex matched control. 2. Normal coronary origin with right dominance. 3.  Severe proximal LM stenosis (>70%). 4. Moderate proximal LCx/OM1 stenosis (50-69%). 5. Mild RCA stenosis (25-49%). 6. CAD-RADS 4 Severe stenosis. (70-99% or > 50% left main). Cardiac catheterization is recommended. 7. Additional analysis with CT FFR will be submitted and reported separately. Electronically Signed   By: Debbe Odea M.D.   On: 03/17/2023 16:51   ECHOCARDIOGRAM COMPLETE  Result Date: 03/17/2023    ECHOCARDIOGRAM REPORT   Patient Name:   [name] Date of Exam: 03/17/2023 Medical Rec #:  [id number]         Height:       66.0 in Accession #:    [id number]        Weight:       175.6 lb Date of Birth:  [birth year]         BSA:          1.892 m Patient Age:    62 years          BP:           148/62 mmHg Patient Gender: M                 HR:           63 bpm. Exam Location:  [place] Procedure: 2D Echo, 3D Echo, Cardiac Doppler, Color Doppler and Strain Analysis Indications:    R07.9* Chest pain, unspecified  History:        Patient has no prior history of Echocardiogram examinations.                 CAD, TIA, Signs/Symptoms:Chest Pain; Risk Factors:Hypertension                 and Dyslipidemia.  Sonographer:    Ilda Mori MHA, BS, RDCS Referring Phys: [id number] BRIAN AGBOR-ETANG IMPRESSIONS  1. Left ventricular ejection fraction, by estimation, is 60 to 65%. The left ventricle has normal function. The left ventricle has no regional wall motion abnormalities. Left ventricular diastolic parameters are consistent with Grade I diastolic dysfunction (impaired relaxation). The average left ventricular global longitudinal strain is -21.8 %.  2. Right ventricular systolic function is normal. The right ventricular size is normal. There is normal pulmonary artery systolic pressure. The estimated right ventricular systolic pressure is 32.9 mmHg.  3. Left atrial size was mild to moderately dilated.  4. The mitral valve is normal in structure. Mild mitral valve regurgitation. No evidence of mitral  stenosis.  5. Tricuspid valve regurgitation is mild to moderate.  6. The aortic valve is tricuspid. There is mild calcification of the aortic valve. Aortic valve regurgitation is moderate. No aortic stenosis is present.  7. The inferior vena cava is normal in size with greater than 50% respiratory variability, suggesting right atrial pressure of 3 mmHg. FINDINGS  Left Ventricle: Left ventricular ejection fraction, by estimation, is 60 to 65%. The left ventricle has normal function. The left ventricle has no regional wall motion abnormalities. The average left ventricular global longitudinal strain is -21.8 %.  The left ventricular internal cavity size was normal in size. There is no left ventricular hypertrophy. Left ventricular diastolic parameters are consistent with Grade I diastolic dysfunction (impaired relaxation). Right Ventricle: The right ventricular size is normal. No increase in right ventricular wall thickness. Right ventricular systolic function is normal. There is normal pulmonary artery systolic pressure. The tricuspid regurgitant velocity is 2.64 m/s, and  with an assumed right atrial pressure of 5 mmHg, the estimated right ventricular systolic pressure is 32.9 mmHg. Left Atrium: Left atrial size was mild to moderately dilated. Right Atrium: Right atrial size was normal in size. Pericardium: There is no evidence of pericardial effusion. Mitral Valve: The mitral valve is normal in structure. Mild mitral valve regurgitation. No evidence of mitral valve stenosis. Tricuspid Valve: The tricuspid valve is normal in structure. Tricuspid valve regurgitation is mild to moderate. No evidence of tricuspid stenosis. Aortic Valve: The aortic valve is tricuspid. There is mild calcification of the aortic valve. Aortic valve regurgitation is moderate. Aortic regurgitation PHT measures 425 msec. No aortic stenosis is present. Pulmonic Valve: The pulmonic valve was normal in structure. Pulmonic valve regurgitation is  mild. No evidence of pulmonic stenosis. Aorta: The aortic root is normal in size and structure. Venous: The inferior vena cava is normal in size with greater than 50% respiratory variability, suggesting right atrial pressure of 3 mmHg. IAS/Shunts: No atrial level shunt detected by color flow Doppler.  LEFT VENTRICLE PLAX 2D LVIDd:         5.00 cm Diastology LVIDs:         3.00 cm LV e' medial:    8.27 cm/s LV PW:         1.00 cm LV E/e' medial:  12.3 LV IVS:        1.00 cm LV e' lateral:   9.90 cm/s                        LV E/e' lateral: 10.3                         2D Longitudinal Strain                        2D Strain GLS Avg:     -21.8 %                         3D Volume EF:                        3D EF:        63 %                        LV EDV:       163 ml                        LV ESV:       60 ml                        LV SV:        102 ml RIGHT VENTRICLE RV Basal diam:  3.70 cm RV Mid diam:    3.20 cm RV S prime:     20.20 cm/s TAPSE (M-mode): 1.7 cm LEFT ATRIUM  Index        RIGHT ATRIUM           Index LA diam:        4.30 cm 2.27 cm/m   RA Area:     20.80 cm LA Vol (A2C):   87.0 ml 45.98 ml/m  RA Volume:   58.90 ml  31.13 ml/m LA Vol (A4C):   61.1 ml 32.29 ml/m LA Biplane Vol: 75.0 ml 39.64 ml/m  AORTIC VALVE AI PHT:      425 msec  AORTA Ao Root diam: 3.40 cm Ao Asc diam:  3.10 cm MITRAL VALVE                TRICUSPID VALVE MV Area (PHT): 4.06 cm     TR Peak grad:   27.9 mmHg MV Decel Time: 187 msec     TR Vmax:        264.00 cm/s MV E velocity: 102.00 cm/s MV A velocity: 87.50 cm/s MV E/A ratio:  1.17 James Nordmann MD Electronically signed by James Nordmann MD Signature Date/Time: 03/17/2023/4:04:49 PM    Final      Assessment and Plan:   Unstable Angina CAD - recent cardiac CTA for for UA showed [place] [address] (96%), severe proximal LM stenosis (>70%), moderate proximal Lcx/OM1 stenosis (50-69%), mild RCA stenosis (25-49%). FFR ostial LM 0.54 and RCA 0.73 -  he was seen in the office 12/6 rpeorted UA and was sent to the ER - HS trop negative - EKG nonacute - continue ASA 81mg daily, Lipitor 20mg  daily. No BB given baseline bradycardia - echo 03/17/23 showed LVEF 60-65%, G1DD, normal RVSF mild MR, mild to mod TR, mod AI - plan for cardiac cath on Monday Risks and benefits of cardiac catheterization have been discussed with the patient.  These include bleeding, infection, kidney damage, stroke, heart attack, death.  The patient understands these risks and is willing to proceed.  Hypertension -Blood pressure mildly elevated -Continue PTA amlodipine, HCTZ, lisinopril - hydrochlorothiazide and lisinopril held for cath  Hyperlipidemia - LDL 38 -Continue Lipitor  Carotid artery stenosis -Status post stent placement right ICA July 2022 and left stent CCA September 2022 -Carotid duplex in October 2024 showed bilateral ICA 40 to 59%, nonhemodynamically significant plaque less than 50% bilateral CCA. - continue asa and statin  For questions or updates, please contact [facility] Please consult [email or web address] for contact info under    Signed, Ciela Mahajan David Stall, PA-C  03/19/2023 7:35 AM

## 2023-03-19 NOTE — Assessment & Plan Note (Signed)
Continue antiplatelet therapy with aspirin and atorvastatin.  Continue metoprolol,

## 2023-03-19 NOTE — Assessment & Plan Note (Signed)
Nicotine patch, counseling on tobacco cessation offered.

## 2023-03-19 NOTE — Assessment & Plan Note (Signed)
Continue aspirin and statin therapy.  Lipid panel ordered.

## 2023-03-19 NOTE — Assessment & Plan Note (Signed)
Continue patient's levothyroxine 137.

## 2023-03-19 NOTE — Assessment & Plan Note (Signed)
Vitals:   03/18/23 1557 03/18/23 1715 03/18/23 1830 03/18/23 1900  BP: (!) 182/71 (!) 140/102 138/62 (!) 144/57   03/18/23 1930 03/18/23 2026 03/18/23 2041 03/19/23 0006  BP: 138/64 (!) 154/70 (!) 163/68 (!) 123/52  Patient started on current hydralazine and will continue home amlodipine regimen.

## 2023-03-19 NOTE — Assessment & Plan Note (Signed)
Patient presenting with midsternal chest pain radiating to the left side similar to his previous episode of chest pain and heart disease.  Currently patient is chest pain-free states he has not had chest pain since being admitted.  Will continue patient on aspirin atorvastatin.  Nitroglycerin.  Troponin so far negative.  Cardiology consulted.

## 2023-03-19 NOTE — Progress Notes (Signed)
[facility]  - [place] at [facility]   PATIENT NAME: [name]    MR#:  [id number]  DATE OF BIRTH:  [birth year]  SUBJECTIVE:  seen earlier. No family at bedside. Denies any chest pain.    VITALS:  Blood pressure (!) 129/53, pulse 68, temperature 98.1 F (36.7 C), resp. rate 20, height 5\' 6"  (1.676 m), weight 78.5 kg, SpO2 97%.  PHYSICAL EXAMINATION:   GENERAL:  62 y.o.-year-old patient with no acute distress.  LUNGS: Normal breath sounds bilaterally, no wheezing CARDIOVASCULAR: S1, S2 normal. No murmur   ABDOMEN: Soft, nontender, nondistended. Bowel sounds present.  EXTREMITIES: No  edema b/l.    NEUROLOGIC: nonfocal  patient is alert and awake SKIN: No obvious rash, lesion, or ulcer.   LABORATORY PANEL:  CBC Recent Labs  Lab 03/18/23 1600  WBC 11.4*  HGB 12.2*  HCT 36.2*  PLT 275    Chemistries  Recent Labs  Lab 03/18/23 1600 03/18/23 1932  NA 142  --   K 4.0  --   CL 112*  --   CO2 22  --   GLUCOSE 91  --   BUN 31*  --   CREATININE 1.50*  --   CALCIUM 9.5  --   MG  --  2.3   Cardiac Enzymes No results for input(s): "TROPONINI" in the last 168 hours. RADIOLOGY:  DG Chest Portable 1 View  Result Date: 03/18/2023 CLINICAL DATA:  Chest pain for the past few months got worse today. EXAM: PORTABLE CHEST 1 VIEW COMPARISON:  Cardiac/coronary CTA dated 03/17/2023 and chest radiographs dated 12/27/2012. FINDINGS: Mildly enlarged cardiac silhouette. Clear lungs with normal vascularity. No pleural fluid. Mild left and minimal right glenohumeral degenerative changes. IMPRESSION: 1. Mild cardiomegaly. 2. No acute abnormality. Electronically Signed   By: Beckie Salts M.D.   On: 03/18/2023 18:34   ECHOCARDIOGRAM COMPLETE  Result Date: 03/17/2023    ECHOCARDIOGRAM REPORT   Patient Name:   [name] Date of Exam: 03/17/2023 Medical Rec #:  [id number]         Height:       66.0 in Accession #:    [id number]        Weight:       175.6 lb Date  of Birth:  [birth year]         BSA:          1.892 m Patient Age:    62 years          BP:           148/62 mmHg Patient Gender: M                 HR:           63 bpm. Exam Location:  [place] Procedure: 2D Echo, 3D Echo, Cardiac Doppler, Color Doppler and Strain Analysis Indications:    R07.9* Chest pain, unspecified  History:        Patient has no prior history of Echocardiogram examinations.                 CAD, TIA, Signs/Symptoms:Chest Pain; Risk Factors:Hypertension                 and Dyslipidemia.  Sonographer:    Ilda Mori MHA, BS, RDCS Referring Phys: [id number] BRIAN AGBOR-ETANG IMPRESSIONS  1. Left ventricular ejection fraction, by estimation, is 60 to 65%. The left ventricle has normal function. The left ventricle has no regional wall motion abnormalities. Left ventricular  diastolic parameters are consistent with Grade I diastolic dysfunction (impaired relaxation). The average left ventricular global longitudinal strain is -21.8 %.  2. Right ventricular systolic function is normal. The right ventricular size is normal. There is normal pulmonary artery systolic pressure. The estimated right ventricular systolic pressure is 32.9 mmHg.  3. Left atrial size was mild to moderately dilated.  4. The mitral valve is normal in structure. Mild mitral valve regurgitation. No evidence of mitral stenosis.  5. Tricuspid valve regurgitation is mild to moderate.  6. The aortic valve is tricuspid. There is mild calcification of the aortic valve. Aortic valve regurgitation is moderate. No aortic stenosis is present.  7. The inferior vena cava is normal in size with greater than 50% respiratory variability, suggesting right atrial pressure of 3 mmHg. FINDINGS  Left Ventricle: Left ventricular ejection fraction, by estimation, is 60 to 65%. The left ventricle has normal function. The left ventricle has no regional wall motion abnormalities. The average left ventricular global longitudinal strain is -21.8 %. The left  ventricular internal cavity size was normal in size. There is no left ventricular hypertrophy. Left ventricular diastolic parameters are consistent with Grade I diastolic dysfunction (impaired relaxation). Right Ventricle: The right ventricular size is normal. No increase in right ventricular wall thickness. Right ventricular systolic function is normal. There is normal pulmonary artery systolic pressure. The tricuspid regurgitant velocity is 2.64 m/s, and  with an assumed right atrial pressure of 5 mmHg, the estimated right ventricular systolic pressure is 32.9 mmHg. Left Atrium: Left atrial size was mild to moderately dilated. Right Atrium: Right atrial size was normal in size. Pericardium: There is no evidence of pericardial effusion. Mitral Valve: The mitral valve is normal in structure. Mild mitral valve regurgitation. No evidence of mitral valve stenosis. Tricuspid Valve: The tricuspid valve is normal in structure. Tricuspid valve regurgitation is mild to moderate. No evidence of tricuspid stenosis. Aortic Valve: The aortic valve is tricuspid. There is mild calcification of the aortic valve. Aortic valve regurgitation is moderate. Aortic regurgitation PHT measures 425 msec. No aortic stenosis is present. Pulmonic Valve: The pulmonic valve was normal in structure. Pulmonic valve regurgitation is mild. No evidence of pulmonic stenosis. Aorta: The aortic root is normal in size and structure. Venous: The inferior vena cava is normal in size with greater than 50% respiratory variability, suggesting right atrial pressure of 3 mmHg. IAS/Shunts: No atrial level shunt detected by color flow Doppler.  LEFT VENTRICLE PLAX 2D LVIDd:         5.00 cm Diastology LVIDs:         3.00 cm LV e' medial:    8.27 cm/s LV PW:         1.00 cm LV E/e' medial:  12.3 LV IVS:        1.00 cm LV e' lateral:   9.90 cm/s                        LV E/e' lateral: 10.3                         2D Longitudinal Strain                        2D  Strain GLS Avg:     -21.8 %  3D Volume EF:                        3D EF:        63 %                        LV EDV:       163 ml                        LV ESV:       60 ml                        LV SV:        102 ml RIGHT VENTRICLE RV Basal diam:  3.70 cm RV Mid diam:    3.20 cm RV S prime:     20.20 cm/s TAPSE (M-mode): 1.7 cm LEFT ATRIUM             Index        RIGHT ATRIUM           Index LA diam:        4.30 cm 2.27 cm/m   RA Area:     20.80 cm LA Vol (A2C):   87.0 ml 45.98 ml/m  RA Volume:   58.90 ml  31.13 ml/m LA Vol (A4C):   61.1 ml 32.29 ml/m LA Biplane Vol: 75.0 ml 39.64 ml/m  AORTIC VALVE AI PHT:      425 msec  AORTA Ao Root diam: 3.40 cm Ao Asc diam:  3.10 cm MITRAL VALVE                TRICUSPID VALVE MV Area (PHT): 4.06 cm     TR Peak grad:   27.9 mmHg MV Decel Time: 187 msec     TR Vmax:        264.00 cm/s MV E velocity: 102.00 cm/s MV A velocity: 87.50 cm/s MV E/A ratio:  1.17 Julien Nordmann MD Electronically signed by Julien Nordmann MD Signature Date/Time: 03/17/2023/4:04:49 PM    Final     Assessment and Plan  [name] is a 62 y.o. male with a hx of CAD, PAD, carotid artery stenosis status post right ICA in July 2022 and left CCA stent in September 2022, hyperlipidemia, hypertension, TIA, right eye amaurosis fugax, hypothyroidism, tobacco abuse who is being seen 03/19/2023 for the evaluation of unstable angina   Patient was seen in the cardiology clinic. He was notified of abnormal cardiac CTA and was requested to come to the ED for further management given his enjoyment of symptoms.  Coronary CTA in December 2024 which showed a coronary calcium score of 1175, 97 percentile, severe proximal LM stenosis, moderate proximal left circumflex/OM1 stenosis, mild RCA stenosis. FFR was significant and ostial left main, significant stenosis in mid RCA   Unstable angina CAD -- recent coronary CTA as above -- troponin negative -- EKG no acute changes -- [facility]  MG cardiology consultation -- continue aspirin, Lipitor -- GDMT per cardiology -- plan for cardiac cath on Monday  Hypertension -- continue amlodipine -- holding hydrochlorothiazide, beta-blockers (bradycardia)  Hyperlipidemia -- on Lipitor  Hypothyroidism -- on Synthroid dose adjusted given overly suppress thyroid. Patient was on 150 g   carotid stenosis status post bilateral stent in the past   Procedures: Family communication : none Consults : [facility] MG cardiology CODE STATUS: full DVT  Prophylaxis : Lovenox Level of care: Telemetry Cardiac Status is: Inpatient Remains inpatient appropriate because: abnormal coronary CT. Cardiac catheterization on Monday    TOTAL TIME TAKING CARE OF THIS PATIENT: 35 minutes.  >50% time spent on counselling and coordination of care  Note: This dictation was prepared with Dragon dictation along with smaller phrase technology. Any transcriptional errors that result from this process are unintentional.  Enedina Finner M.D    [facility]   CC: Primary care physician; Lorn Junes, FNP (Inactive)

## 2023-03-20 DIAGNOSIS — I200 Unstable angina: Secondary | ICD-10-CM | POA: Diagnosis not present

## 2023-03-20 NOTE — Progress Notes (Signed)
[facility]  - [place] at [facility]   PATIENT NAME: [name]    MR#:  [id number]  DATE OF BIRTH:  1960/10/01  SUBJECTIVE:  seen earlier. No family at bedside. Denies any chest pain. Ambulating in the room without any chest pain.    VITALS:  Blood pressure (!) 130/57, pulse 70, temperature 98.4 F (36.9 C), temperature source Oral, resp. rate 20, height 5\' 6"  (1.676 m), weight 78.5 kg, SpO2 97%.  PHYSICAL EXAMINATION:   GENERAL:  62 y.o.-year-old patient with no acute distress.  LUNGS: Normal breath sounds bilaterally, no wheezing CARDIOVASCULAR: S1, S2 normal. No murmur   ABDOMEN: Soft, nontender, nondistended. Bowel sounds present.  EXTREMITIES: No  edema b/l.    NEUROLOGIC: nonfocal  patient is alert and awake  LABORATORY PANEL:  CBC Recent Labs  Lab 03/18/23 1600  WBC 11.4*  HGB 12.2*  HCT 36.2*  PLT 275    Chemistries  Recent Labs  Lab 03/18/23 1600 03/18/23 1932  NA 142  --   K 4.0  --   CL 112*  --   CO2 22  --   GLUCOSE 91  --   BUN 31*  --   CREATININE 1.50*  --   CALCIUM 9.5  --   MG  --  2.3   Cardiac Enzymes No results for input(s): "TROPONINI" in the last 168 hours. RADIOLOGY:  DG Chest Portable 1 View  Result Date: 03/18/2023 CLINICAL DATA:  Chest pain for the past few months got worse today. EXAM: PORTABLE CHEST 1 VIEW COMPARISON:  Cardiac/coronary CTA dated 03/17/2023 and chest radiographs dated 12/27/2012. FINDINGS: Mildly enlarged cardiac silhouette. Clear lungs with normal vascularity. No pleural fluid. Mild left and minimal right glenohumeral degenerative changes. IMPRESSION: 1. Mild cardiomegaly. 2. No acute abnormality. Electronically Signed   By: Beckie Salts M.D.   On: 03/18/2023 18:34    Assessment and Plan  [name] is a 62 y.o. male with a hx of CAD, PAD, carotid artery stenosis status post right ICA in July 2022 and left CCA stent in September 2022, hyperlipidemia, hypertension, TIA, right eye  amaurosis fugax, hypothyroidism, tobacco abuse who is being seen 03/19/2023 for the evaluation of unstable angina   Patient was seen in the cardiology clinic. He was notified of abnormal cardiac CTA and was requested to come to the ED for further management given his enjoyment of symptoms.  Coronary CTA in December 2024 which showed a coronary calcium score of 1175, 97 percentile, severe proximal LM stenosis, moderate proximal left circumflex/OM1 stenosis, mild RCA stenosis. FFR was significant and ostial left main, significant stenosis in mid RCA   Unstable angina CAD -- recent coronary CTA as above -- troponin negative -- EKG no acute changes -- [name] MG cardiology consultation appreciated -- continue aspirin, Lipitor -- GDMT per cardiology -- plan for cardiac cath on Monday  Hypertension -- continue amlodipine -- holding hydrochlorothiazide, beta-blockers (bradycardia)  Hyperlipidemia -- on Lipitor  Hypothyroidism -- on Synthroid dose adjusted given overly suppress thyroid. Patient was on 150 g  H/o carotid stenosis status post bilateral stent in the past   Procedures: Family communication : none Consults : [facility] cardiology CODE STATUS: full DVT Prophylaxis : Lovenox Level of care: Telemetry Cardiac Status is: Inpatient Remains inpatient appropriate because: abnormal coronary CT. Cardiac catheterization on Monday    TOTAL TIME TAKING CARE OF THIS PATIENT: 35 minutes.  >50% time spent on counselling and coordination of care  Note: This dictation was prepared  with Dragon dictation along with smaller phrase technology. Any transcriptional errors that result from this process are unintentional.  Enedina Finner M.D    [facility]   CC: Primary care physician; Lorn Junes, FNP (Inactive)

## 2023-03-20 NOTE — Plan of Care (Signed)
Patient without distress, no complaints of chest pain. Currently resting. Will continue to monitor.   Problem: Education: Goal: Understanding of cardiac disease, CV risk reduction, and recovery process will improve Outcome: Progressing Goal: Individualized Educational Video(s) Outcome: Progressing   Problem: Activity: Goal: Ability to tolerate increased activity will improve Outcome: Progressing   Problem: Cardiac: Goal: Ability to achieve and maintain adequate cardiovascular perfusion will improve Outcome: Progressing   Problem: Health Behavior/Discharge Planning: Goal: Ability to safely manage health-related needs after discharge will improve Outcome: Progressing   Problem: Education: Goal: Knowledge of General Education information will improve Description: Including pain rating scale, medication(s)/side effects and non-pharmacologic comfort measures Outcome: Progressing   Problem: Health Behavior/Discharge Planning: Goal: Ability to manage health-related needs will improve Outcome: Progressing   Problem: Clinical Measurements: Goal: Ability to maintain clinical measurements within normal limits will improve Outcome: Progressing Goal: Will remain free from infection Outcome: Progressing Goal: Diagnostic test results will improve Outcome: Progressing Goal: Respiratory complications will improve Outcome: Progressing Goal: Cardiovascular complication will be avoided Outcome: Progressing   Problem: Activity: Goal: Risk for activity intolerance will decrease Outcome: Progressing   Problem: Nutrition: Goal: Adequate nutrition will be maintained Outcome: Progressing   Problem: Coping: Goal: Level of anxiety will decrease Outcome: Progressing   Problem: Elimination: Goal: Will not experience complications related to bowel motility Outcome: Progressing Goal: Will not experience complications related to urinary retention Outcome: Progressing   Problem: Pain  Management: Goal: General experience of comfort will improve Outcome: Progressing   Problem: Safety: Goal: Ability to remain free from injury will improve Outcome: Progressing   Problem: Skin Integrity: Goal: Risk for impaired skin integrity will decrease Outcome: Progressing

## 2023-03-20 NOTE — H&P (View-Only) (Signed)
Rounding Note    Patient Name: [name] Date of Encounter: 03/20/2023  [facility] Cardiologist: Debbe Odea, MD   Subjective   Patient denies chest pain or SOB. Plan for [name] tomorrow.   Inpatient Medications    Scheduled Meds:  amLODipine  5 mg Oral Daily   aspirin  81 mg Oral Daily   atorvastatin  20 mg Oral Daily   heparin  5,000 Units Subcutaneous Q8H   levothyroxine  125 mcg Oral Q0600   metoprolol tartrate  100 mg Oral Once   nicotine  21 mg Transdermal Daily   pantoprazole  40 mg Oral Daily   Continuous Infusions:  PRN Meds: acetaminophen, hydrALAZINE, nitroGLYCERIN, ondansetron (ZOFRAN) IV, mouth rinse   Vital Signs    Vitals:   03/19/23 2031 03/20/23 0000 03/20/23 0029 03/20/23 0410  BP: 137/64 136/62 (!) 117/41 (!) 137/59  Pulse: 71 63 (!) 59 63  Resp: 20  20 20   Temp: 98.8 F (37.1 C)  97.9 F (36.6 C) 98.2 F (36.8 C)  TempSrc: Oral  Oral Oral  SpO2: 96% 97% 98% 99%  Weight:      Height:       No intake or output data in the 24 hours ending 03/20/23 1108    03/18/2023    3:56 PM 03/18/2023    3:01 PM 02/25/2023    9:48 AM  Last 3 Weights  Weight (lbs) 173 lb 177 lb 175 lb 9.6 oz  Weight (kg) 78.472 kg 80.287 kg 79.652 kg      Telemetry    NSR HR 60s - Personally Reviewed  ECG    No new - Personally Reviewed  Physical Exam   GEN: No acute distress.   Neck: No JVD Cardiac: RRR, no murmurs, rubs, or gallops.  Respiratory: Clear to auscultation bilaterally. GI: Soft, nontender, non-distended  MS: No edema; No deformity. Neuro:  Nonfocal  Psych: Normal affect   Labs    High Sensitivity Troponin:   Recent Labs  Lab 03/18/23 1600 03/18/23 1934  TROPONINIHS 6 8     Chemistry Recent Labs  Lab 03/18/23 1600 03/18/23 1932  NA 142  --   K 4.0  --   CL 112*  --   CO2 22  --   GLUCOSE 91  --   BUN 31*  --   CREATININE 1.50*  --   CALCIUM 9.5  --   MG  --  2.3  GFRNONAA 53*  --   ANIONGAP 8  --      Lipids  Recent Labs  Lab 03/18/23 1932  CHOL 93  TRIG 83  HDL 38*  LDLCALC 38  CHOLHDL 2.4    Hematology Recent Labs  Lab 03/18/23 1600  WBC 11.4*  RBC 3.75*  HGB 12.2*  HCT 36.2*  MCV 96.5  MCH 32.5  MCHC 33.7  RDW 13.4  PLT 275   Thyroid  Recent Labs  Lab 03/18/23 1932  TSH 0.249*  FREET4 1.20*    BNPNo results for input(s): "BNP", "PROBNP" in the last 168 hours.  DDimer No results for input(s): "DDIMER" in the last 168 hours.   Radiology    DG Chest Portable 1 View  Result Date: 03/18/2023 CLINICAL DATA:  Chest pain for the past few months got worse today. EXAM: PORTABLE CHEST 1 VIEW COMPARISON:  Cardiac/coronary CTA dated 03/17/2023 and chest radiographs dated 12/27/2012. FINDINGS: Mildly enlarged cardiac silhouette. Clear lungs with normal vascularity. No pleural fluid. Mild left and minimal  right glenohumeral degenerative changes. IMPRESSION: 1. Mild cardiomegaly. 2. No acute abnormality. Electronically Signed   By: Beckie Salts M.D.   On: 03/18/2023 18:34    Cardiac Studies    Coronary CT 03/17/23  IMPRESSION: 1. Coronary calcium score of 1175. This was 97th percentile for age and sex matched control.   2. Normal coronary origin with right dominance.   3. Severe proximal LM stenosis (>70%).   4. Moderate proximal LCx/OM1 stenosis (50-69%).   5. Mild RCA stenosis (25-49%).   6. CAD-RADS 4 Severe stenosis. (70-99% or > 50% left main). Cardiac catheterization is recommended.   7. Additional analysis with CT FFR will be submitted and reported separately.     IMPRESSION: 1. CT FFR analysis showed significant stenosis in ostial LM. FFRct 0.54   2.  Significant stenosis in mid RCA,  FFRct 0.73.   3.  Cardiac catheterization recommended.     Electronically Signed   By: Debbe Odea M.D.   On: 03/18/2023 11:20   Echo 03/17/23 1. Left ventricular ejection fraction, by estimation, is 60 to 65%. The  left ventricle has normal  function. The left ventricle has no regional  wall motion abnormalities. Left ventricular diastolic parameters are  consistent with Grade I diastolic  dysfunction (impaired relaxation). The average left ventricular global  longitudinal strain is -21.8 %.   2. Right ventricular systolic function is normal. The right ventricular  size is normal. There is normal pulmonary artery systolic pressure. The  estimated right ventricular systolic pressure is 32.9 mmHg.   3. Left atrial size was mild to moderately dilated.   4. The mitral valve is normal in structure. Mild mitral valve  regurgitation. No evidence of mitral stenosis.   5. Tricuspid valve regurgitation is mild to moderate.   6. The aortic valve is tricuspid. There is mild calcification of the  aortic valve. Aortic valve regurgitation is moderate. No aortic stenosis  is present.   7. The inferior vena cava is normal in size with greater than 50%  respiratory variability, suggesting right atrial pressure of 3 mmHg.   Patient Profile     62 y.o. male with a hx of CAD, PAD, carotid artery stenosis status post right ICA in July 2022 and left CCA stent in September 2022, hyperlipidemia, hypertension, TIA, right eye amaurosis fugax, hypothyroidism, tobacco abuse who is being seen 03/19/2023 for the evaluation of unstable angina   Assessment & Plan    Unstable Angina CAD - recent cardiac CTA for for UA showed [place] [address] (96%), severe proximal LM stenosis (>70%), moderate proximal Lcx/OM1 stenosis (50-69%), mild RCA stenosis (25-49%). FFR ostial LM 0.54 and RCA 0.73 - he was seen in the office 12/6 rpeorted UA and was sent to the ER - HS trop negative - EKG nonacute - continue ASA 81mg daily, Lipitor 20mg  daily. No BB given baseline bradycardia - echo 03/17/23 showed LVEF 60-65%, G1DD, normal RVSF mild MR, mild to mod TR, mod AI - plan for cardiac cath on 12/9 Risks and benefits of cardiac catheterization have been  discussed with the patient.  These include bleeding, infection, kidney damage, stroke, heart attack, death.  The patient understands these risks and is willing to proceed.   Hypertension -Blood pressure mildly elevated -Continue PTA amlodipine, HCTZ, lisinopril - hydrochlorothiazide and lisinopril held for cath   Hyperlipidemia - LDL 38 -Continue Lipitor   Carotid artery stenosis -Status post stent placement right ICA July 2022 and left stent CCA September 2022 -Carotid  duplex in October 2024 showed bilateral ICA 40 to 59%, nonhemodynamically significant plaque less than 50% bilateral CCA. - continue asa and statin  For questions or updates, please contact [facility] Please consult [email or web address] for contact info under        Signed, Austyn Seier David Stall, PA-C  03/20/2023, 11:08 AM

## 2023-03-20 NOTE — Progress Notes (Signed)
Rounding Note    Patient Name: [name] Date of Encounter: 03/20/2023  [facility] Cardiologist: Debbe Odea, MD   Subjective   Patient denies chest pain or SOB. Plan for [name] tomorrow.   Inpatient Medications    Scheduled Meds:  amLODipine  5 mg Oral Daily   aspirin  81 mg Oral Daily   atorvastatin  20 mg Oral Daily   heparin  5,000 Units Subcutaneous Q8H   levothyroxine  125 mcg Oral Q0600   metoprolol tartrate  100 mg Oral Once   nicotine  21 mg Transdermal Daily   pantoprazole  40 mg Oral Daily   Continuous Infusions:  PRN Meds: acetaminophen, hydrALAZINE, nitroGLYCERIN, ondansetron (ZOFRAN) IV, mouth rinse   Vital Signs    Vitals:   03/19/23 2031 03/20/23 0000 03/20/23 0029 03/20/23 0410  BP: 137/64 136/62 (!) 117/41 (!) 137/59  Pulse: 71 63 (!) 59 63  Resp: 20  20 20   Temp: 98.8 F (37.1 C)  97.9 F (36.6 C) 98.2 F (36.8 C)  TempSrc: Oral  Oral Oral  SpO2: 96% 97% 98% 99%  Weight:      Height:       No intake or output data in the 24 hours ending 03/20/23 1108    03/18/2023    3:56 PM 03/18/2023    3:01 PM 02/25/2023    9:48 AM  Last 3 Weights  Weight (lbs) 173 lb 177 lb 175 lb 9.6 oz  Weight (kg) 78.472 kg 80.287 kg 79.652 kg      Telemetry    NSR HR 60s - Personally Reviewed  ECG    No new - Personally Reviewed  Physical Exam   GEN: No acute distress.   Neck: No JVD Cardiac: RRR, no murmurs, rubs, or gallops.  Respiratory: Clear to auscultation bilaterally. GI: Soft, nontender, non-distended  MS: No edema; No deformity. Neuro:  Nonfocal  Psych: Normal affect   Labs    High Sensitivity Troponin:   Recent Labs  Lab 03/18/23 1600 03/18/23 1934  TROPONINIHS 6 8     Chemistry Recent Labs  Lab 03/18/23 1600 03/18/23 1932  NA 142  --   K 4.0  --   CL 112*  --   CO2 22  --   GLUCOSE 91  --   BUN 31*  --   CREATININE 1.50*  --   CALCIUM 9.5  --   MG  --  2.3  GFRNONAA 53*  --   ANIONGAP 8  --      Lipids  Recent Labs  Lab 03/18/23 1932  CHOL 93  TRIG 83  HDL 38*  LDLCALC 38  CHOLHDL 2.4    Hematology Recent Labs  Lab 03/18/23 1600  WBC 11.4*  RBC 3.75*  HGB 12.2*  HCT 36.2*  MCV 96.5  MCH 32.5  MCHC 33.7  RDW 13.4  PLT 275   Thyroid  Recent Labs  Lab 03/18/23 1932  TSH 0.249*  FREET4 1.20*    BNPNo results for input(s): "BNP", "PROBNP" in the last 168 hours.  DDimer No results for input(s): "DDIMER" in the last 168 hours.   Radiology    DG Chest Portable 1 View  Result Date: 03/18/2023 CLINICAL DATA:  Chest pain for the past few months got worse today. EXAM: PORTABLE CHEST 1 VIEW COMPARISON:  Cardiac/coronary CTA dated 03/17/2023 and chest radiographs dated 12/27/2012. FINDINGS: Mildly enlarged cardiac silhouette. Clear lungs with normal vascularity. No pleural fluid. Mild left and minimal  right glenohumeral degenerative changes. IMPRESSION: 1. Mild cardiomegaly. 2. No acute abnormality. Electronically Signed   By: Beckie Salts M.D.   On: 03/18/2023 18:34    Cardiac Studies    Coronary CT 03/17/23  IMPRESSION: 1. Coronary calcium score of 1175. This was 97th percentile for age and sex matched control.   2. Normal coronary origin with right dominance.   3. Severe proximal LM stenosis (>70%).   4. Moderate proximal LCx/OM1 stenosis (50-69%).   5. Mild RCA stenosis (25-49%).   6. CAD-RADS 4 Severe stenosis. (70-99% or > 50% left main). Cardiac catheterization is recommended.   7. Additional analysis with CT FFR will be submitted and reported separately.     IMPRESSION: 1. CT FFR analysis showed significant stenosis in ostial LM. FFRct 0.54   2.  Significant stenosis in mid RCA,  FFRct 0.73.   3.  Cardiac catheterization recommended.     Electronically Signed   By: Debbe Odea M.D.   On: 03/18/2023 11:20   Echo 03/17/23 1. Left ventricular ejection fraction, by estimation, is 60 to 65%. The  left ventricle has normal  function. The left ventricle has no regional  wall motion abnormalities. Left ventricular diastolic parameters are  consistent with Grade I diastolic  dysfunction (impaired relaxation). The average left ventricular global  longitudinal strain is -21.8 %.   2. Right ventricular systolic function is normal. The right ventricular  size is normal. There is normal pulmonary artery systolic pressure. The  estimated right ventricular systolic pressure is 32.9 mmHg.   3. Left atrial size was mild to moderately dilated.   4. The mitral valve is normal in structure. Mild mitral valve  regurgitation. No evidence of mitral stenosis.   5. Tricuspid valve regurgitation is mild to moderate.   6. The aortic valve is tricuspid. There is mild calcification of the  aortic valve. Aortic valve regurgitation is moderate. No aortic stenosis  is present.   7. The inferior vena cava is normal in size with greater than 50%  respiratory variability, suggesting right atrial pressure of 3 mmHg.   Patient Profile     62 y.o. male with a hx of CAD, PAD, carotid artery stenosis status post right ICA in July 2022 and left CCA stent in September 2022, hyperlipidemia, hypertension, TIA, right eye amaurosis fugax, hypothyroidism, tobacco abuse who is being seen 03/19/2023 for the evaluation of unstable angina   Assessment & Plan    Unstable Angina CAD - recent cardiac CTA for for UA showed [place] [address] (96%), severe proximal LM stenosis (>70%), moderate proximal Lcx/OM1 stenosis (50-69%), mild RCA stenosis (25-49%). FFR ostial LM 0.54 and RCA 0.73 - he was seen in the office 12/6 rpeorted UA and was sent to the ER - HS trop negative - EKG nonacute - continue ASA 81mg daily, Lipitor 20mg  daily. No BB given baseline bradycardia - echo 03/17/23 showed LVEF 60-65%, G1DD, normal RVSF mild MR, mild to mod TR, mod AI - plan for cardiac cath on 12/9 Risks and benefits of cardiac catheterization have been  discussed with the patient.  These include bleeding, infection, kidney damage, stroke, heart attack, death.  The patient understands these risks and is willing to proceed.   Hypertension -Blood pressure mildly elevated -Continue PTA amlodipine, HCTZ, lisinopril - hydrochlorothiazide and lisinopril held for cath   Hyperlipidemia - LDL 38 -Continue Lipitor   Carotid artery stenosis -Status post stent placement right ICA July 2022 and left stent CCA September 2022 -Carotid  duplex in October 2024 showed bilateral ICA 40 to 59%, nonhemodynamically significant plaque less than 50% bilateral CCA. - continue asa and statin  For questions or updates, please contact [facility] Please consult [email or web address] for contact info under        Signed, Austyn Seier David Stall, PA-C  03/20/2023, 11:08 AM

## 2023-03-21 ENCOUNTER — Encounter
Admission: EM | Disposition: A | Discharge status: Other Acute Inpatient Hospital | Payer: Self-pay | Source: Home / Self Care | Attending: Internal Medicine

## 2023-03-21 ENCOUNTER — Encounter (HOSPITAL_COMMUNITY): Payer: Self-pay

## 2023-03-21 DIAGNOSIS — I2510 Atherosclerotic heart disease of native coronary artery without angina pectoris: Secondary | ICD-10-CM | POA: Diagnosis not present

## 2023-03-21 DIAGNOSIS — I200 Unstable angina: Secondary | ICD-10-CM | POA: Diagnosis not present

## 2023-03-21 DIAGNOSIS — I25110 Atherosclerotic heart disease of native coronary artery with unstable angina pectoris: Secondary | ICD-10-CM | POA: Diagnosis not present

## 2023-03-21 DIAGNOSIS — I10 Essential (primary) hypertension: Secondary | ICD-10-CM | POA: Diagnosis not present

## 2023-03-21 DIAGNOSIS — Z720 Tobacco use: Secondary | ICD-10-CM | POA: Diagnosis not present

## 2023-03-21 HISTORY — PX: LEFT HEART CATH AND CORONARY ANGIOGRAPHY: CATH118249

## 2023-03-21 LAB — PROTIME-INR
INR: 1.1 (ref 0.8–1.2)
Prothrombin Time: 14 s (ref 11.4–15.2)

## 2023-03-21 LAB — HIGH SENSITIVITY CRP: CRP, High Sensitivity: 1.3 mg/L (ref 0.00–3.00)

## 2023-03-21 LAB — APTT: aPTT: 40 s — ABNORMAL HIGH (ref 24–36)

## 2023-03-21 LAB — HEPARIN LEVEL (UNFRACTIONATED): Heparin Unfractionated: 0.27 [IU]/mL — ABNORMAL LOW (ref 0.30–0.70)

## 2023-03-21 SURGERY — LEFT HEART CATH AND CORONARY ANGIOGRAPHY
Anesthesia: Moderate Sedation

## 2023-03-21 MED ORDER — SODIUM CHLORIDE 0.9% FLUSH: 3.0000 mL | INTRAVENOUS | Status: DC | PRN

## 2023-03-21 MED ORDER — SODIUM CHLORIDE 0.9 % WEIGHT BASED INFUSION
1.0000 mL/kg/h | INTRAVENOUS | Status: DC
  Administered 2023-03-21: 1 mL/kg/h via INTRAVENOUS

## 2023-03-21 MED ORDER — IOHEXOL 300 MG/ML  SOLN
INTRAMUSCULAR | Status: DC | PRN
  Administered 2023-03-21: 46 mL

## 2023-03-21 MED ORDER — LIDOCAINE HCL 1 % IJ SOLN
INTRAMUSCULAR | Status: AC
  Filled 2023-03-21: qty 20

## 2023-03-21 MED ORDER — HEPARIN (PORCINE) 25000 UT/250ML-% IV SOLN
1100.0000 [IU]/h | INTRAVENOUS | Status: DC
  Administered 2023-03-21: 950 [IU]/h via INTRAVENOUS
  Filled 2023-03-21 (×2): qty 250

## 2023-03-21 MED ORDER — VERAPAMIL HCL 2.5 MG/ML IV SOLN
INTRAVENOUS | Status: DC | PRN
  Administered 2023-03-21: 2.5 mg via INTRA_ARTERIAL

## 2023-03-21 MED ORDER — FENTANYL CITRATE (PF) 100 MCG/2ML IJ SOLN
INTRAMUSCULAR | Status: AC
  Filled 2023-03-21: qty 2

## 2023-03-21 MED ORDER — HEPARIN BOLUS VIA INFUSION
1200.0000 [IU] | Freq: Once | INTRAVENOUS | Status: AC
  Administered 2023-03-21: 1200 [IU] via INTRAVENOUS
  Filled 2023-03-21: qty 1200

## 2023-03-21 MED ORDER — HEPARIN (PORCINE) IN NACL 1000-0.9 UT/500ML-% IV SOLN
INTRAVENOUS | Status: AC
  Filled 2023-03-21: qty 1000

## 2023-03-21 MED ORDER — HEPARIN SODIUM (PORCINE) 1000 UNIT/ML IJ SOLN
INTRAMUSCULAR | Status: DC | PRN
  Administered 2023-03-21: 3500 [IU] via INTRAVENOUS

## 2023-03-21 MED ORDER — LEVOTHYROXINE SODIUM 125 MCG PO TABS: 125.0000 ug | ORAL_TABLET | Freq: Every day | ORAL | Status: AC

## 2023-03-21 MED ORDER — MIDAZOLAM HCL 2 MG/2ML IJ SOLN
INTRAMUSCULAR | Status: DC | PRN
  Administered 2023-03-21: 2 mg via INTRAVENOUS

## 2023-03-21 MED ORDER — VERAPAMIL HCL 2.5 MG/ML IV SOLN
INTRAVENOUS | Status: AC
  Filled 2023-03-21: qty 2

## 2023-03-21 MED ORDER — ASPIRIN 81 MG PO CHEW
81.0000 mg | CHEWABLE_TABLET | ORAL | Status: AC
  Administered 2023-03-21: 81 mg via ORAL

## 2023-03-21 MED ORDER — ASPIRIN 81 MG PO TBEC
81.0000 mg | DELAYED_RELEASE_TABLET | Freq: Every day | ORAL | Status: DC
  Administered 2023-03-22: 81 mg via ORAL
  Filled 2023-03-21: qty 1

## 2023-03-21 MED ORDER — SODIUM CHLORIDE 0.9 % IV SOLN: 250.0000 mL | INTRAVENOUS | Status: AC | PRN

## 2023-03-21 MED ORDER — FENTANYL CITRATE (PF) 100 MCG/2ML IJ SOLN
INTRAMUSCULAR | Status: DC | PRN
  Administered 2023-03-21: 25 ug via INTRAVENOUS

## 2023-03-21 MED ORDER — MIDAZOLAM HCL 2 MG/2ML IJ SOLN
INTRAMUSCULAR | Status: AC
  Filled 2023-03-21: qty 2

## 2023-03-21 MED ORDER — LABETALOL HCL 5 MG/ML IV SOLN: 10.0000 mg | INTRAVENOUS | Status: AC | PRN

## 2023-03-21 MED ORDER — SODIUM CHLORIDE 0.9% FLUSH
3.0000 mL | Freq: Two times a day (BID) | INTRAVENOUS | Status: DC
  Administered 2023-03-21 – 2023-03-22 (×2): 3 mL via INTRAVENOUS

## 2023-03-21 MED ORDER — HEPARIN SODIUM (PORCINE) 1000 UNIT/ML IJ SOLN
INTRAMUSCULAR | Status: AC
  Filled 2023-03-21: qty 10

## 2023-03-21 MED ORDER — SODIUM CHLORIDE 0.9 % WEIGHT BASED INFUSION
3.0000 mL/kg/h | INTRAVENOUS | Status: DC
Start: 2023-03-22 — End: 2023-03-21
  Administered 2023-03-21: 3 mL/kg/h via INTRAVENOUS

## 2023-03-21 MED ORDER — HYDRALAZINE HCL 20 MG/ML IJ SOLN: 10.0000 mg | INTRAMUSCULAR | Status: AC | PRN

## 2023-03-21 MED ORDER — ASPIRIN 81 MG PO CHEW
CHEWABLE_TABLET | ORAL | Status: AC
  Filled 2023-03-21: qty 1

## 2023-03-21 MED ORDER — LIDOCAINE HCL (PF) 1 % IJ SOLN
INTRAMUSCULAR | Status: DC | PRN
  Administered 2023-03-21: 5 mL

## 2023-03-21 MED ORDER — SODIUM CHLORIDE 0.9 % IV SOLN: INTRAVENOUS | Status: AC

## 2023-03-21 MED ORDER — HEPARIN (PORCINE) IN NACL 1000-0.9 UT/500ML-% IV SOLN
INTRAVENOUS | Status: DC | PRN
  Administered 2023-03-21 (×2): 500 mL

## 2023-03-21 SURGICAL SUPPLY — 11 items
CATH INFINITI AMBI 5FR TG (CATHETERS) IMPLANT
CATH INFINITI JR4 5F (CATHETERS) IMPLANT
DEVICE RAD TR BAND REGULAR (VASCULAR PRODUCTS) IMPLANT
DRAPE BRACHIAL (DRAPES) IMPLANT
GLIDESHEATH SLEND SS 6F .021 (SHEATH) IMPLANT
GUIDEWIRE INQWIRE 1.5J.035X260 (WIRE) IMPLANT
INQWIRE 1.5J .035X260CM (WIRE) ×1
PACK CARDIAC CATH (CUSTOM PROCEDURE TRAY) ×1 IMPLANT
PROTECTION STATION PRESSURIZED (MISCELLANEOUS) ×1
SET ATX-X65L (MISCELLANEOUS) IMPLANT
STATION PROTECTION PRESSURIZED (MISCELLANEOUS) IMPLANT

## 2023-03-21 NOTE — Discharge Summary (Signed)
Physician Discharge Summary  Patient: [name] JYN:[id number] DOB: March 13, 1961   Code Status: Full Code Admit date: 03/18/2023 Discharge date: 03/21/2023 Disposition:  [facility] , for CVTS assessment PCP: Lorn Junes, FNP (Inactive)  Recommendations for Outpatient Follow-up:  Follow up with hospitalist upon arrival and cardiology, CVTS  Discharge Diagnoses:  Principal Problem:   Unstable angina [facility]) Active Problems:   History of CAD (coronary artery disease)   Essential hypertension   Hypothyroidism   Hx of transient ischemic attack (TIA)   Tobacco abuse   Coronary artery disease involving native coronary artery of native heart  Brief Hospital Course Summary: [name] is a 62 y.o. male with PMH hypertension, hyperlipidemia, PAD presenting after outpatient coronary CT showed severe CAD including left main stenosis and reporting chest pain in office visit.   Cath performed to evaluate intervention needed 12/9. Determined to have severe left main and RCA disease which was recommended to be addressed with CABG. Cardiology arranging follow up care with CVTS who plans tentatively to accept patient for CABG 12/11. Patient and family were updated and agreeable to this plan. Patient is currently chest pain free and awaiting transfer. Medications guided by cardiology.     Discharge Condition: Stable, improved Recommended discharge diet: Cardiac diet  Consultations: Cardiology   Procedures/Studies: [facility]  Allergies as of 03/21/2023   No Known Allergies      Medication List     STOP taking these medications    hydrochlorothiazide 25 MG tablet Commonly known as: HYDRODIURIL       TAKE these medications    acetaminophen 500 MG tablet Commonly known as: TYLENOL Take 1,000 mg by mouth every 6 (six) hours as needed for mild pain.   amLODipine 5 MG tablet Commonly known as: NORVASC Take 5 mg by mouth daily.   aspirin EC 81 MG tablet Take 1 tablet (81  mg total) by mouth at bedtime. Swallow whole.   atorvastatin 40 MG tablet Commonly known as: LIPITOR Take 40 mg by mouth at bedtime.   levothyroxine 125 MCG tablet Commonly known as: SYNTHROID Take 1 tablet (125 mcg total) by mouth daily at 6 (six) AM. Start taking on: March 22, 2023 What changed:  medication strength how much to take when to take this Another medication with the same name was removed. Continue taking this medication, and follow the directions you see here.   metoprolol tartrate 100 MG tablet Commonly known as: LOPRESSOR Take 1 tablet (100 mg total) by mouth once for 1 dose. TAKE TWO HOURS PRIOR TO CARDIAC CTA   nitroGLYCERIN 0.4 MG SL tablet Commonly known as: NITROSTAT Place 1 tablet (0.4 mg total) under the tongue every 5 (five) minutes as needed for chest pain.       Subjective   Pt reports no chest pain, SOB. Feels like normal self at rest. Has only ambulated to bathroom in room and back to bed since admission but has not been symptomatic with this movement.   All questions and concerns were addressed at time of discharge.  Objective  Blood pressure (!) 140/53, pulse 69, temperature 98.4 F (36.9 C), temperature source Oral, resp. rate 15, height 5\' 6"  (1.676 m), weight 76.7 kg, SpO2 98%.   General: Pt is alert, awake, not in acute distress Cardiovascular: RRR, S1/S2 +, no rubs, no gallops Respiratory: CTA bilaterally, no wheezing, no rhonchi Abdominal: Soft, NT, ND, bowel sounds + Extremities: no edema, no cyanosis  The results of significant diagnostics from this hospitalization (  including imaging, microbiology, ancillary and laboratory) are listed below for reference.   Imaging studies: DG Chest Portable 1 View  Result Date: 03/18/2023 CLINICAL DATA:  Chest pain for the past few months got worse today. EXAM: PORTABLE CHEST 1 VIEW COMPARISON:  Cardiac/coronary CTA dated 03/17/2023 and chest radiographs dated 12/27/2012. FINDINGS: Mildly  enlarged cardiac silhouette. Clear lungs with normal vascularity. No pleural fluid. Mild left and minimal right glenohumeral degenerative changes. IMPRESSION: 1. Mild cardiomegaly. 2. No acute abnormality. Electronically Signed   By: Beckie Salts M.D.   On: 03/18/2023 18:34   CT CORONARY FFR DATA PREP & FLUID ANALYSIS  Result Date: 03/18/2023 EXAM: CT FFR ANALYSIS CLINICAL DATA:  Abnormal CCTA FINDINGS: FFRct analysis was performed on the original cardiac CT angiogram dataset. Diagrammatic representation of the FFRct analysis is provided in a separate PDF document in PACS. This dictation was created using the PDF document and an interactive 3D model of the results. 3D model is not available in the EMR/PACS. Normal FFR range is >0.80. 1. Left Main:  significant stenosis in ostial LM.  FFRct 0.54 2. LAD: significant stenosis due to LM disease. 3. LCX: significant stenosis due to LM disease 4. RCA: significant stenosis in mid RCA FFRct 0.73. IMPRESSION: 1. CT FFR analysis showed significant stenosis in ostial LM. FFRct 0.54 2.  Significant stenosis in mid RCA,  FFRct 0.73. 3.  Cardiac catheterization recommended. Electronically Signed   By: Debbe Odea M.D.   On: 03/18/2023 11:20   CT CORONARY MORPH W/CTA COR W/SCORE W/CA W/CM &/OR WO/CM  Result Date: 03/17/2023 CLINICAL DATA:  Chest pain EXAM: Cardiac/Coronary  CTA TECHNIQUE: The patient was scanned on a Siemens Somatom go.Top scanner. : A retrospective scan was triggered in the ascending thoracic aorta. Axial non-contrast 3 mm slices were carried out through the heart. The data set was analyzed on a dedicated work station and scored using the Agatson method. Gantry rotation speed was 330 msecs and collimation was .6 mm. 100mg  of metoprolol and 0.8 mg of sl NTG was given. The 3D data set was reconstructed in 5% intervals of the 60-95 % of the R-R cycle. Diastolic phases were analyzed on a dedicated work station using MPR, MIP and VRT modes. The patient  received 75 cc of contrast. FINDINGS: Aorta:  Normal size.  Aortic wall calcifications.  No dissection. Aortic Valve:  Trileaflet.  No calcifications. Coronary Arteries:  Normal coronary origin.  Right dominance. RCA is a dominant artery. There is calcified plaque throughout the RCA causing mild stenosis (25-49%). Left main gives rise to LAD and LCX arteries. LM has calcified and non calcified plaque proximally causing severe stenosis (>70%). LAD has calcified plaque proximally causing mild stenosis (25-49%). LCX is a non-dominant artery. There is calcified plaque in the proximal LCx/OM1 causing moderate stenosis (50-69%) Other findings: Normal pulmonary vein drainage into the left atrium. Normal left atrial appendage without a thrombus. Normal size of the pulmonary artery. IMPRESSION: 1. Coronary calcium score of 1175. This was 97th percentile for age and sex matched control. 2. Normal coronary origin with right dominance. 3. Severe proximal LM stenosis (>70%). 4. Moderate proximal LCx/OM1 stenosis (50-69%). 5. Mild RCA stenosis (25-49%). 6. CAD-RADS 4 Severe stenosis. (70-99% or > 50% left main). Cardiac catheterization is recommended. 7. Additional analysis with CT FFR will be submitted and reported separately. Electronically Signed   By: Debbe Odea M.D.   On: 03/17/2023 16:51   ECHOCARDIOGRAM COMPLETE  Result Date: 03/17/2023    ECHOCARDIOGRAM REPORT  Patient Name:   TRAYSHAUN CLERE Date of Exam: 03/17/2023 Medical Rec #:  [id number]         Height:       66.0 in Accession #:    [id number]        Weight:       175.6 lb Date of Birth:  07-Aug-1960         BSA:          1.892 m Patient Age:    62 years          BP:           148/62 mmHg Patient Gender: M                 HR:           63 bpm. Exam Location:  [facility] Procedure: 2D Echo, 3D Echo, Cardiac Doppler, Color Doppler and Strain Analysis Indications:    R07.9* Chest pain, unspecified  History:        Patient has no prior history of  Echocardiogram examinations.                 CAD, TIA, Signs/Symptoms:Chest Pain; Risk Factors:Hypertension                 and Dyslipidemia.  Sonographer:    Ilda Mori MHA, BS, RDCS Referring Phys: [id number] BRIAN AGBOR-ETANG IMPRESSIONS  1. Left ventricular ejection fraction, by estimation, is 60 to 65%. The left ventricle has normal function. The left ventricle has no regional wall motion abnormalities. Left ventricular diastolic parameters are consistent with Grade I diastolic dysfunction (impaired relaxation). The average left ventricular global longitudinal strain is -21.8 %.  2. Right ventricular systolic function is normal. The right ventricular size is normal. There is normal pulmonary artery systolic pressure. The estimated right ventricular systolic pressure is 32.9 mmHg.  3. Left atrial size was mild to moderately dilated.  4. The mitral valve is normal in structure. Mild mitral valve regurgitation. No evidence of mitral stenosis.  5. Tricuspid valve regurgitation is mild to moderate.  6. The aortic valve is tricuspid. There is mild calcification of the aortic valve. Aortic valve regurgitation is moderate. No aortic stenosis is present.  7. The inferior vena cava is normal in size with greater than 50% respiratory variability, suggesting right atrial pressure of 3 mmHg. FINDINGS  Left Ventricle: Left ventricular ejection fraction, by estimation, is 60 to 65%. The left ventricle has normal function. The left ventricle has no regional wall motion abnormalities. The average left ventricular global longitudinal strain is -21.8 %. The left ventricular internal cavity size was normal in size. There is no left ventricular hypertrophy. Left ventricular diastolic parameters are consistent with Grade I diastolic dysfunction (impaired relaxation). Right Ventricle: The right ventricular size is normal. No increase in right ventricular wall thickness. Right ventricular systolic function is normal. There is normal  pulmonary artery systolic pressure. The tricuspid regurgitant velocity is 2.64 m/s, and  with an assumed right atrial pressure of 5 mmHg, the estimated right ventricular systolic pressure is 32.9 mmHg. Left Atrium: Left atrial size was mild to moderately dilated. Right Atrium: Right atrial size was normal in size. Pericardium: There is no evidence of pericardial effusion. Mitral Valve: The mitral valve is normal in structure. Mild mitral valve regurgitation. No evidence of mitral valve stenosis. Tricuspid Valve: The tricuspid valve is normal in structure. Tricuspid valve regurgitation is mild to moderate. No evidence of tricuspid stenosis. Aortic Valve: The aortic valve is  tricuspid. There is mild calcification of the aortic valve. Aortic valve regurgitation is moderate. Aortic regurgitation PHT measures 425 msec. No aortic stenosis is present. Pulmonic Valve: The pulmonic valve was normal in structure. Pulmonic valve regurgitation is mild. No evidence of pulmonic stenosis. Aorta: The aortic root is normal in size and structure. Venous: The inferior vena cava is normal in size with greater than 50% respiratory variability, suggesting right atrial pressure of 3 mmHg. IAS/Shunts: No atrial level shunt detected by color flow Doppler.  LEFT VENTRICLE PLAX 2D LVIDd:         5.00 cm Diastology LVIDs:         3.00 cm LV e' medial:    8.27 cm/s LV PW:         1.00 cm LV E/e' medial:  12.3 LV IVS:        1.00 cm LV e' lateral:   9.90 cm/s                        LV E/e' lateral: 10.3                         2D Longitudinal Strain                        2D Strain GLS Avg:     -21.8 %                         3D Volume EF:                        3D EF:        63 %                        LV EDV:       163 ml                        LV ESV:       60 ml                        LV SV:        102 ml RIGHT VENTRICLE RV Basal diam:  3.70 cm RV Mid diam:    3.20 cm RV S prime:     20.20 cm/s TAPSE (M-mode): 1.7 cm LEFT ATRIUM              Index        RIGHT ATRIUM           Index LA diam:        4.30 cm 2.27 cm/m   RA Area:     20.80 cm LA Vol (A2C):   87.0 ml 45.98 ml/m  RA Volume:   58.90 ml  31.13 ml/m LA Vol (A4C):   61.1 ml 32.29 ml/m LA Biplane Vol: 75.0 ml 39.64 ml/m  AORTIC VALVE AI PHT:      425 msec  AORTA Ao Root diam: 3.40 cm Ao Asc diam:  3.10 cm MITRAL VALVE                TRICUSPID VALVE MV Area (PHT): 4.06 cm     TR Peak grad:   27.9 mmHg MV Decel Time: 187 msec     TR Vmax:  264.00 cm/s MV E velocity: 102.00 cm/s MV A velocity: 87.50 cm/s MV E/A ratio:  1.17 Julien Nordmann MD Electronically signed by Julien Nordmann MD Signature Date/Time: 03/17/2023/4:04:49 PM    Final     Labs: Basic Metabolic Panel: Recent Labs  Lab 03/18/23 1600 03/18/23 1932  NA 142  --   K 4.0  --   CL 112*  --   CO2 22  --   GLUCOSE 91  --   BUN 31*  --   CREATININE 1.50*  --   CALCIUM 9.5  --   MG  --  2.3   CBC: Recent Labs  Lab 03/18/23 1600  WBC 11.4*  NEUTROABS 6.6  HGB 12.2*  HCT 36.2*  MCV 96.5  PLT 275   Microbiology: Results for orders placed or performed during the hospital encounter of 12/17/20  SARS CORONAVIRUS 2 (TAT 6-24 HRS) Nasopharyngeal Nasopharyngeal Swab     Status: None   Collection Time: 12/17/20 10:46 AM   Specimen: Nasopharyngeal Swab  Result Value Ref Range Status   SARS Coronavirus 2 NEGATIVE NEGATIVE Final    Comment: (NOTE) SARS-CoV-2 target nucleic acids are NOT DETECTED.  The SARS-CoV-2 RNA is generally detectable in upper and lower respiratory specimens during the acute phase of infection. Negative results do not preclude SARS-CoV-2 infection, do not rule out co-infections with other pathogens, and should not be used as the sole basis for treatment or other patient management decisions. Negative results must be combined with clinical observations, patient history, and epidemiological information. The expected result is Negative.  Fact Sheet for  Patients: HairSlick.no  Fact Sheet for Healthcare Providers: quierodirigir.com  This test is not yet approved or cleared by the [place] FDA and  has been authorized for detection and/or diagnosis of SARS-CoV-2 by FDA under an Emergency Use Authorization (EUA). This EUA will remain  in effect (meaning this test can be used) for the duration of the COVID-19 declaration under Se ction 564(b)(1) of the Act, 21 U.S.C. section 360bbb-3(b)(1), unless the authorization is terminated or revoked sooner.  Performed at [facility], [address] [place], [place] [address]   MRSA Next Gen by PCR, Nasal     Status: None   Collection Time: 12/17/20  1:22 PM   Specimen: Nasal Mucosa; Nasal Swab  Result Value Ref Range Status   MRSA by PCR Next Gen NOT DETECTED NOT DETECTED Final    Comment: (NOTE) The GeneXpert MRSA Assay (FDA approved for NASAL specimens only), is one component of a comprehensive MRSA colonization surveillance program. It is not intended to diagnose MRSA infection nor to guide or monitor treatment for MRSA infections. Test performance is not FDA approved in patients less than 59 years old. Performed at [facility], [address] [place], [place] [address]    Time coordinating discharge: Over 30 minutes  Leeroy Bock, MD  [facility] 03/21/2023, 1:54 PM

## 2023-03-21 NOTE — Consult Note (Signed)
PHARMACY - ANTICOAGULATION CONSULT NOTE  Pharmacy Consult for heparin Indication: chest pain/ACS  No Known Allergies  Patient Measurements: Height: 5\' 6"  (167.6 cm) Weight: 76.7 kg (169 lb) IBW/kg (Calculated) : 63.8 Heparin Dosing Weight: 76.7 kg  Vital Signs: Temp: 98.8 F (37.1 C) (12/09 1410) Temp Source: Oral (12/09 1410) BP: 157/59 (12/09 1410) Pulse Rate: 76 (12/09 1410)  Labs: Recent Labs    03/18/23 1934 03/21/23 1533  APTT  --  40*  LABPROT  --  14.0  INR  --  1.1  TROPONINIHS 8  --     Estimated Creatinine Clearance: 50.5 mL/min (A) (by C-G formula based on SCr of 1.5 mg/dL (H)).   Medical History: Past Medical History:  Diagnosis Date   Arthritis    Carotid artery disease (HCC)    seen by [facility]   Coronary artery disease    Hx of transient ischemic attack (TIA)    Hyperlipidemia    Hypertension    Thyroid disease    hypothyrodism    Medications:  No history of chronic anticoagulant use PTA  Assessment: 62 y.o. male with PMH hypertension, hyperlipidemia, PAD presenting after outpatient coronary CT showed severe CAD including left main stenosis and reporting chest pain in office visit. Cath performed to evaluate intervention needed 12/9. Determined to have severe left main and RCA disease which was recommended to be addressed with CABG. Cardiology arranging follow up care with CVTS who plans tentatively to accept patient for CABG 12/11. Patient and family were updated and agreeable to this plan. Patient is currently chest pain free and awaiting transfer. Medications guided by cardiology. Pharmacy has been consulted to initiate continuous heparin while awaiting transfer. Baseline labs have been ordered and are pending.  Goal of Therapy:  Heparin level 0.3-0.7 units/ml Monitor platelets by anticoagulation protocol: Yes   Plan: heparin level subtherapeutic --bolus 1200 units IV heparin x 1 - increase heparin infusion rate to 1100  units/hr - recheck anti-Xa level in 6 hours after rate change and at least once daily while on heparin - continue to monitor H&H and platelets  Lowella Bandy 03/21/2023,4:12 PM

## 2023-03-21 NOTE — H&P (Signed)
Cardiology Admission History and Physical   Patient ID: [name] MRN: [id number]; DOB: [birth year]   Admission date: (Not on file)  PCP:  Lorn Junes, FNP (Inactive)   [facility] Providers Cardiologist:  Debbe Odea, MD        Chief Complaint:  Unstable Angina  Patient Profile:   [name] is a 62 y.o. male with hx of CAD, PAD, carotid artery stenosis status post right ICA in July 2022 and left CCA stent in September 2022, hyperlipidemia, hypertension, TIA, right eye amaurosis fugax, hypothyroidism, tobacco abuse, who is being seen 03/21/2023 for the evaluation of unstable angina.  History of Present Illness:   [name] underwent coronary CTA in December 2024 which showed a coronary calcium score of 1175, 97 percentile, severe proximal LM stenosis, moderate proximal left circumflex/OM1 stenosis, mild RCA stenosis.  FFR was significant and ostial left main, significant stenosis in mid RCA.  Patient was seen in the office 03/18/2023 reporting persistent unstable angina and he was sent to the ER.  Echo from March 17, 2023 showed EF of 60 to 65%, no wall motion abnormality, grade 1 diastolic dysfunction, normal RV size and function, mild to moderate LAE, mild MR, mild to moderate TR, moderate AI, mild aortic valve calcification without stenosis.   The patient presented to the ER from the cardiology office on 03/18/2023 and was admitted for further workup. In the ER blood pressure 182/71, pulse rate 88, respiratory rate 18, afebrile, 99% O2.  EKG showed normal sinus rhythm, 84 bpm without ST/T wave changes.  Labs showed serum creatinine 1.50/BUN 31, WBC 11.4, hemoglobin 12.2. CXR nonacute.   Left heart catheterization was completed today which revealed severe ostial left main 70% stenosis with significant catheter dampening.  Otherwise modest to 50-60% mid LAD lesion with good distal target.  LAD had a large diagonal branch which was also a good target  as well as main LAD.  Had no significant disease.  Heavily calcified severe ostial RCA 70-80% stenosis with proximal 40% stenosis, normal LVEDP.  CVTS consultation was deemed appropriate and patient was transferred to [facility] for inpatient evaluation versus urgent outpatient evaluation.  Past Medical History:  Diagnosis Date   Arthritis    Carotid artery disease (HCC)    seen by [facility]   Coronary artery disease    Hx of transient ischemic attack (TIA)    Hyperlipidemia    Hypertension    Thyroid disease    hypothyrodism    Past Surgical History:  Procedure Laterality Date   BACK SURGERY     CAROTID PTA/STENT INTERVENTION Right 10/22/2020   Procedure: CAROTID PTA/STENT INTERVENTION;  Surgeon: Renford Dills, MD;  Location: [facility] INVASIVE CV LAB;  Service: Cardiovascular;  Laterality: Right;   CAROTID PTA/STENT INTERVENTION Left 12/17/2020   Procedure: CAROTID PTA/STENT INTERVENTION;  Surgeon: Renford Dills, MD;  Location: [facility] INVASIVE CV LAB;  Service: Cardiovascular;  Laterality: Left;   CATARACT EXTRACTION W/PHACO Right 08/18/2022   Procedure: CATARACT EXTRACTION PHACO AND INTRAOCULAR LENS PLACEMENT (IOC) RIGHT MALYUGIN 4.19 00:24.5;  Surgeon: Lockie Mola, MD;  Location[facility];  Service: Ophthalmology;  Laterality: Right;     Medications Prior to Admission: Prior to Admission medications   Medication Sig Start Date End Date Taking? Authorizing Provider  acetaminophen (TYLENOL) 500 MG tablet Take 1,000 mg by mouth every 6 (six) hours as needed for mild pain. 11/12/20   [provider]  amLODipine (NORVASC) 5 MG tablet Take  5 mg by mouth daily. 01/04/23   [provider]  aspirin EC 81 MG EC tablet Take 1 tablet (81 mg total) by mouth at bedtime. Swallow whole. 12/18/20   Schnier, Latina Craver, MD  atorvastatin (LIPITOR) 40 MG tablet Take 40 mg by mouth at bedtime. 08/07/20   [provider]  hydrochlorothiazide  (HYDRODIURIL) 25 MG tablet Take 25 mg by mouth at bedtime. 07/31/20   [provider]  levothyroxine (SYNTHROID) 125 MCG tablet Take 1 tablet (125 mcg total) by mouth daily at 6 (six) AM. 03/22/23   Leeroy Bock, MD  levothyroxine (SYNTHROID) 150 MCG tablet Take 150 mcg by mouth every morning. 10/13/22   [provider]  metoprolol tartrate (LOPRESSOR) 100 MG tablet Take 1 tablet (100 mg total) by mouth once for 1 dose. TAKE TWO HOURS PRIOR TO CARDIAC CTA 02/25/23 02/25/23  Debbe Odea, MD  nitroGLYCERIN (NITROSTAT) 0.4 MG SL tablet Place 1 tablet (0.4 mg total) under the tongue every 5 (five) minutes as needed for chest pain. 02/25/23 05/26/23  Debbe Odea, MD     Allergies:   No Known Allergies  Social History:   Social History   Socioeconomic History   Marital status: Married    Spouse name: Not on file   Number of children: Not on file   Years of education: Not on file   Highest education level: Not on file  Occupational History   Not on file  Tobacco Use   Smoking status: Every Day    Current packs/day: 1.00    Average packs/day: 1 pack/day for 47.0 years (47.0 ttl pk-yrs)    Types: Cigarettes   Smokeless tobacco: Never  Vaping Use   Vaping status: Never Used  Substance and Sexual Activity   Alcohol use: Never   Drug use: Never   Sexual activity: Not on file  Other Topics Concern   Not on file  Social History Narrative   Not on file   Social Determinants of Health   Financial Resource Strain: Not on file  Food Insecurity: No Food Insecurity (03/18/2023)   Hunger Vital Sign    Worried About Running Out of Food in the Last Year: Never true    Ran Out of Food in the Last Year: Never true  Transportation Needs: No Transportation Needs (03/18/2023)   PRAPARE - Administrator, Civil Service (Medical): No    Lack of Transportation (Non-Medical): No  Physical Activity: Not on file  Stress: Not on file  Social Connections: Not  on file  Intimate Partner Violence: Not At Risk (03/18/2023)   Humiliation, Afraid, Rape, and Kick questionnaire    Fear of Current or Ex-Partner: No    Emotionally Abused: No    Physically Abused: No    Sexually Abused: No    Family History:   The patient's family history includes Cancer in his father; Diabetes in his mother; Heart attack in his mother; Heart disease in his brother and sister; Hypothyroidism in his brother, mother, and sister.    ROS:  Please see the history of present illness.  Review of Systems  Constitutional:  Positive for malaise/fatigue.  Respiratory:  Positive for shortness of breath.   Cardiovascular:  Positive for chest pain.  Neurological:  Positive for weakness.   All other ROS reviewed and negative.     Physical Exam/Data:  There were no vitals filed for this visit. No intake or output data in the 24 hours ending 03/21/23 1500  03/21/2023    8:10 AM 03/18/2023    3:56 PM 03/18/2023    3:01 PM  Last 3 Weights  Weight (lbs) 169 lb 173 lb 177 lb  Weight (kg) 76.658 kg 78.472 kg 80.287 kg     There is no height or weight on file to calculate BMI.  General:  Well nourished, well developed, in no acute distress HEENT: normal Neck: no JVD Vascular: No carotid bruits; Distal pulses 2+ bilaterally   Cardiac:  normal S1, S2; RRR; no murmur  Lungs:  clear to auscultation bilaterally, no wheezing, rhonchi or rales  Abd: soft, nontender, no hepatomegaly  Ext: no edema Musculoskeletal:  No deformities, BUE and BLE strength normal and equal Skin: warm and dry  Neuro:  CNs 2-12 intact, no focal abnormalities noted Psych:  Normal affect    EKG:  The ECG that was done 03/21/2023 was personally reviewed and demonstrates sinus rhythm with a rate of 84 with anterior infarct  Relevant CV Studies: [facility] 03/21/2023 POST-OPERATIVE DIAGNOSIS:   Severe ostial left main 70% stenosis with significant catheter dampening.  Otherwise modest to 50 to 60% mid OM lesion with  good distal target.  LAD has a large diagonal branch which is also a good target as well as the main LAD.  No significant disease. Heavily calcified severe ostial RCA 70 to 80% stenosis with proximal 40% stenosis. Normal LVEDP  Coronary CT 03/17/23 IMPRESSION: 1. Coronary calcium score of 1175. This was 97th percentile for age and sex matched control.   2. Normal coronary origin with right dominance.   3. Severe proximal LM stenosis (>70%).   4. Moderate proximal LCx/OM1 stenosis (50-69%).   5. Mild RCA stenosis (25-49%).   6. CAD-RADS 4 Severe stenosis. (70-99% or > 50% left main). Cardiac catheterization is recommended.   7. Additional analysis with CT FFR will be submitted and reported separately.   IMPRESSION: 1. CT FFR analysis showed significant stenosis in ostial LM. FFRct 0.54   2.  Significant stenosis in mid RCA,  FFRct 0.73.   3.  Cardiac catheterization recommended.   2D echo 03/17/2023 1. Left ventricular ejection fraction, by estimation, is 60 to 65%. The  left ventricle has normal function. The left ventricle has no regional  wall motion abnormalities. Left ventricular diastolic parameters are  consistent with Grade I diastolic  dysfunction (impaired relaxation). The average left ventricular global  longitudinal strain is -21.8 %.   2. Right ventricular systolic function is normal. The right ventricular  size is normal. There is normal pulmonary artery systolic pressure. The  estimated right ventricular systolic pressure is 32.9 mmHg.   3. Left atrial size was mild to moderately dilated.   4. The mitral valve is normal in structure. Mild mitral valve  regurgitation. No evidence of mitral stenosis.   5. Tricuspid valve regurgitation is mild to moderate.   6. The aortic valve is tricuspid. There is mild calcification of the  aortic valve. Aortic valve regurgitation is moderate. No aortic stenosis  is present.   7. The inferior vena cava is normal in size  with greater than 50%  respiratory variability, suggesting right atrial pressure of 3 mmHg.   Laboratory Data:  High Sensitivity Troponin:   Recent Labs  Lab 03/18/23 1600 03/18/23 1934  TROPONINIHS 6 8      Chemistry Recent Labs  Lab 03/18/23 1600 03/18/23 1932  NA 142  --   K 4.0  --   CL 112*  --   CO2  22  --   GLUCOSE 91  --   BUN 31*  --   CREATININE 1.50*  --   CALCIUM 9.5  --   MG  --  2.3  GFRNONAA 53*  --   ANIONGAP 8  --     No results for input(s): "PROT", "ALBUMIN", "AST", "ALT", "ALKPHOS", "BILITOT" in the last 168 hours. Lipids  Recent Labs  Lab 03/18/23 1932  CHOL 93  TRIG 83  HDL 38*  LDLCALC 38  CHOLHDL 2.4   Hematology Recent Labs  Lab 03/18/23 1600  WBC 11.4*  RBC 3.75*  HGB 12.2*  HCT 36.2*  MCV 96.5  MCH 32.5  MCHC 33.7  RDW 13.4  PLT 275   Thyroid  Recent Labs  Lab 03/18/23 1932  TSH 0.249*  FREET4 1.20*   BNPNo results for input(s): "BNP", "PROBNP" in the last 168 hours.  DDimer No results for input(s): "DDIMER" in the last 168 hours.   Radiology/Studies:  No results found.   Assessment and Plan:   Unstable angina/coronary artery disease with left main and RCA -Recent coronary CTA showed coronary calcium score of 11,175 (96 percentile), severe proximal left main stenosis (greater than 70%), moderate proximal left circumflex/OM1 stenosis (50 to 69%), mild RCA stenosis (25-49%).  FFR ostial left main 0.54 and RCA 0.73 -Presented to cardiology office on 12/6 and reported unstable angina and was sent to the emergency department -High-sensitivity troponins trended and negative -EKG was nonacute -He was continued on aspirin 81 mg daily, atorvastatin 20 mg daily -No beta-blocker therapy given baseline bradycardia -Echocardiogram completed 03/17/2023 showed an LVEF of 60 to 65%, G1 DD, normal RV SF, mild MR, mild to moderate TR, and moderate AI -Left heart catheterization revealed severe multivessel disease of the left main  and RCA -Continued on heparin infusion -Recommendations were to transfer to [facility] for CVTS evaluation for CABG -Continue with telemetry monitoring -EKG as needed for pain or changes  Hypertension -Blood pressure 157/59 -Continued on amlodipine 5 mg, metoprolol titrate 100 mg -Vital signs per unit protocol  Hyperlipidemia -LDL 38 -Continued on atorvastatin  Carotid artery stenosis -Status post stent placement to the right ICA July 2022 and left stent to CCA September 2022 -Carotid duplex in October 2024 showed bilateral ICA 40 to 59% stenosis, nonhemodynamically significant plaque less than 50% bilateral CCA -Continue on aspirin and statin  Hypothyroidism -Continued on levothyroxine  Gastroesophageal reflux disease -Continue PPI therapy  Tobacco abuse -Continued on NicoDerm CQ patches -Total cessation is recommended   Risk Assessment/Risk Scores:    TIMI Risk Score for Unstable Angina or Non-ST Elevation MI:   The patient's TIMI risk score is  , which indicates a  % risk of all cause mortality, new or recurrent myocardial infarction or need for urgent revascularization in the next 14 days.      Code Status: Full Code  Severity of Illness: The appropriate patient status for this patient is INPATIENT. Inpatient status is judged to be reasonable and necessary in order to provide the required intensity of service to ensure the patient's safety. The patient's presenting symptoms, physical exam findings, and initial radiographic and laboratory data in the context of their chronic comorbidities is felt to place them at high risk for further clinical deterioration. Furthermore, it is not anticipated that the patient will be medically stable for discharge from the hospital within 2 midnights of admission.   * I certify that at the point of admission it is my clinical judgment  that the patient will require inpatient hospital care spanning beyond 2 midnights from the  point of admission due to high intensity of service, high risk for further deterioration and high frequency of surveillance required.*   For questions or updates, please contact [facility] Please consult [email or web address] for contact info under     Signed, Mylei Brackeen, NP  03/21/2023 3:00 PM  p

## 2023-03-21 NOTE — Interval H&P Note (Signed)
History and Physical Interval Note:  03/21/2023 10:33 AM  [name]  has presented today for surgery, with the diagnosis of unstable angina.  The various methods of treatment have been discussed with the patient and family. After consideration of risks, benefits and other options for treatment, the patient has consented to  Procedure(s): LEFT HEART CATH AND CORONARY ANGIOGRAPHY (N/A)  PERCUTANEOUS CORONARY INTERVENTION  as a surgical intervention.  The patient's history has been reviewed, patient examined, no change in status, stable for surgery.  I have reviewed the patient's chart and labs.  Questions were answered to the patient's satisfaction.   Cath Lab Visit (complete for each Cath Lab visit)  Clinical Evaluation Leading to the Procedure:   ACS: Yes.    Non-ACS:    Anginal Classification: CCS IV  Anti-ischemic medical therapy: Minimal Therapy (1 class of medications)  Non-Invasive Test Results: High-risk stress test findings: cardiac mortality >3%/year  Prior CABG: No previous CABG    Bryan Lemma

## 2023-03-21 NOTE — H&P (Signed)
Cardiology Admission History and Physical   Patient ID: [name] MRN: [id number]; DOB: December 10, 1960   Admission date: 03/18/2023  PCP:  Lorn Junes, FNP (Inactive)   [facility] Providers Cardiologist:  Debbe Odea, MD        Chief Complaint:  Unstable Angina  Patient Profile:   [name] is a 62 y.o. male with hx of CAD, PAD, carotid artery stenosis status post right ICA in July 2022 and left CCA stent in September 2022, hyperlipidemia, hypertension, TIA, right eye amaurosis fugax, hypothyroidism, tobacco abuse, who is being seen 03/21/2023 for the evaluation of unstable angina.  History of Present Illness:   [name] underwent coronary CTA in December 2024 which showed a coronary calcium score of 1175, 97 percentile, severe proximal LM stenosis, moderate proximal left circumflex/OM1 stenosis, mild RCA stenosis.  FFR was significant and ostial left main, significant stenosis in mid RCA.  Patient was seen in the office 03/18/2023 reporting persistent unstable angina and he was sent to the ER.  Echo from March 17, 2023 showed EF of 60 to 65%, no wall motion abnormality, grade 1 diastolic dysfunction, normal RV size and function, mild to moderate LAE, mild MR, mild to moderate TR, moderate AI, mild aortic valve calcification without stenosis.   The patient presented to the ER from the cardiology office on 03/18/2023 and was admitted for further workup. In the ER blood pressure 182/71, pulse rate 88, respiratory rate 18, afebrile, 99% O2.  EKG showed normal sinus rhythm, 84 bpm without ST/T wave changes.  Labs showed serum creatinine 1.50/BUN 31, WBC 11.4, hemoglobin 12.2. CXR nonacute.   Left heart catheterization was completed today which revealed severe ostial left main 70% stenosis with significant catheter dampening.  Otherwise modest to 50-60% mid LAD lesion with good distal target.  LAD had a large diagonal branch which was also a good target as  well as main LAD.  Had no significant disease.  Heavily calcified severe ostial RCA 70-80% stenosis with proximal 40% stenosis, normal LVEDP.  CVTS consultation was deemed appropriate and patient was transferred to [facility] for inpatient evaluation versus urgent outpatient evaluation.  Past Medical History:  Diagnosis Date   Arthritis    Carotid artery disease (HCC)    seen by [facility]   Coronary artery disease    Hx of transient ischemic attack (TIA)    Hyperlipidemia    Hypertension    Thyroid disease    hypothyrodism    Past Surgical History:  Procedure Laterality Date   BACK SURGERY     CAROTID PTA/STENT INTERVENTION Right 10/22/2020   Procedure: CAROTID PTA/STENT INTERVENTION;  Surgeon: Renford Dills, MD;  Location: [facility] INVASIVE CV LAB;  Service: Cardiovascular;  Laterality: Right;   CAROTID PTA/STENT INTERVENTION Left 12/17/2020   Procedure: CAROTID PTA/STENT INTERVENTION;  Surgeon: Renford Dills, MD;  Location: [facility] INVASIVE CV LAB;  Service: Cardiovascular;  Laterality: Left;   CATARACT EXTRACTION W/PHACO Right 08/18/2022   Procedure: CATARACT EXTRACTION PHACO AND INTRAOCULAR LENS PLACEMENT (IOC) RIGHT MALYUGIN 4.19 00:24.5;  Surgeon: Lockie Mola, MD;  Location: [facility];  Service: Ophthalmology;  Laterality: Right;     Medications Prior to Admission: Prior to Admission medications   Medication Sig Start Date End Date Taking? Authorizing Provider  acetaminophen (TYLENOL) 500 MG tablet Take 1,000 mg by mouth every 6 (six) hours as needed for mild pain. 11/12/20   [provider]  amLODipine (NORVASC) 5 MG tablet Take 5 mg  by mouth daily. 01/04/23   [provider]  aspirin EC 81 MG EC tablet Take 1 tablet (81 mg total) by mouth at bedtime. Swallow whole. 12/18/20   Schnier, Latina Craver, MD  atorvastatin (LIPITOR) 40 MG tablet Take 40 mg by mouth at bedtime. 08/07/20   [provider]  hydrochlorothiazide  (HYDRODIURIL) 25 MG tablet Take 25 mg by mouth at bedtime. 07/31/20   [provider]  levothyroxine (SYNTHROID) 125 MCG tablet Take 1 tablet (125 mcg total) by mouth daily at 6 (six) AM. 03/22/23   Leeroy Bock, MD  levothyroxine (SYNTHROID) 150 MCG tablet Take 150 mcg by mouth every morning. 10/13/22   [provider]  metoprolol tartrate (LOPRESSOR) 100 MG tablet Take 1 tablet (100 mg total) by mouth once for 1 dose. TAKE TWO HOURS PRIOR TO CARDIAC CTA 02/25/23 02/25/23  Debbe Odea, MD  nitroGLYCERIN (NITROSTAT) 0.4 MG SL tablet Place 1 tablet (0.4 mg total) under the tongue every 5 (five) minutes as needed for chest pain. 02/25/23 05/26/23  Debbe Odea, MD     Allergies:   No Known Allergies  Social History:   Social History   Socioeconomic History   Marital status: Married    Spouse name: Not on file   Number of children: Not on file   Years of education: Not on file   Highest education level: Not on file  Occupational History   Not on file  Tobacco Use   Smoking status: Every Day    Current packs/day: 1.00    Average packs/day: 1 pack/day for 47.0 years (47.0 ttl pk-yrs)    Types: Cigarettes   Smokeless tobacco: Never  Vaping Use   Vaping status: Never Used  Substance and Sexual Activity   Alcohol use: Never   Drug use: Never   Sexual activity: Not on file  Other Topics Concern   Not on file  Social History Narrative   Not on file   Social Determinants of Health   Financial Resource Strain: Not on file  Food Insecurity: No Food Insecurity (03/18/2023)   Hunger Vital Sign    Worried About Running Out of Food in the Last Year: Never true    Ran Out of Food in the Last Year: Never true  Transportation Needs: No Transportation Needs (03/18/2023)   PRAPARE - Administrator, Civil Service (Medical): No    Lack of Transportation (Non-Medical): No  Physical Activity: Not on file  Stress: Not on file  Social Connections: Not  on file  Intimate Partner Violence: Not At Risk (03/18/2023)   Humiliation, Afraid, Rape, and Kick questionnaire    Fear of Current or Ex-Partner: No    Emotionally Abused: No    Physically Abused: No    Sexually Abused: No    Family History:   The patient's family history includes Cancer in his father; Diabetes in his mother; Heart attack in his mother; Heart disease in his brother and sister; Hypothyroidism in his brother, mother, and sister.    ROS:  Please see the history of present illness.  Review of Systems  Constitutional:  Positive for malaise/fatigue.  Respiratory:  Positive for shortness of breath.   Cardiovascular:  Positive for chest pain.  Neurological:  Positive for weakness.   All other ROS reviewed and negative.     Physical Exam/Data:   Vitals:   03/21/23 1315 03/21/23 1330 03/21/23 1345 03/21/23 1410  BP: (!) 138/55 (!) 140/53 (!) 135/55 (!) 157/59  Pulse:  71 69 68 76  Resp: (!) 29 15 (!) 23 16  Temp:    98.8 F (37.1 C)  TempSrc:    Oral  SpO2: 95% 98% 96% 97%  Weight:      Height:       No intake or output data in the 24 hours ending 03/21/23 1700    03/21/2023    8:10 AM 03/18/2023    3:56 PM 03/18/2023    3:01 PM  Last 3 Weights  Weight (lbs) 169 lb 173 lb 177 lb  Weight (kg) 76.658 kg 78.472 kg 80.287 kg     Body mass index is 27.28 kg/m.  General:  Well nourished, well developed, in no acute distress HEENT: normal Neck: no JVD Vascular: No carotid bruits; Distal pulses 2+ bilaterally   Cardiac:  normal S1, S2; RRR; no murmur  Lungs:  clear to auscultation bilaterally, no wheezing, rhonchi or rales  Abd: soft, nontender, no hepatomegaly  Ext: no edema Musculoskeletal:  No deformities, BUE and BLE strength normal and equal Skin: warm and dry  Neuro:  CNs 2-12 intact, no focal abnormalities noted Psych:  Normal affect    EKG:  The ECG that was done 03/21/2023 was personally reviewed and demonstrates sinus rhythm with a rate of 84 with  anterior infarct  Relevant CV Studies: [facility] 03/21/2023 POST-OPERATIVE DIAGNOSIS:   Severe ostial left main 70% stenosis with significant catheter dampening.  Otherwise modest to 50 to 60% mid OM lesion with good distal target.  LAD has a large diagonal branch which is also a good target as well as the main LAD.  No significant disease. Heavily calcified severe ostial RCA 70 to 80% stenosis with proximal 40% stenosis. Normal LVEDP  Coronary CT 03/17/23 IMPRESSION: 1. Coronary calcium score of 1175. This was 97th percentile for age and sex matched control.   2. Normal coronary origin with right dominance.   3. Severe proximal LM stenosis (>70%).   4. Moderate proximal LCx/OM1 stenosis (50-69%).   5. Mild RCA stenosis (25-49%).   6. CAD-RADS 4 Severe stenosis. (70-99% or > 50% left main). Cardiac catheterization is recommended.   7. Additional analysis with CT FFR will be submitted and reported separately.   IMPRESSION: 1. CT FFR analysis showed significant stenosis in ostial LM. FFRct 0.54   2.  Significant stenosis in mid RCA,  FFRct 0.73.   3.  Cardiac catheterization recommended.   2D echo 03/17/2023 1. Left ventricular ejection fraction, by estimation, is 60 to 65%. The  left ventricle has normal function. The left ventricle has no regional  wall motion abnormalities. Left ventricular diastolic parameters are  consistent with Grade I diastolic  dysfunction (impaired relaxation). The average left ventricular global  longitudinal strain is -21.8 %.   2. Right ventricular systolic function is normal. The right ventricular  size is normal. There is normal pulmonary artery systolic pressure. The  estimated right ventricular systolic pressure is 32.9 mmHg.   3. Left atrial size was mild to moderately dilated.   4. The mitral valve is normal in structure. Mild mitral valve  regurgitation. No evidence of mitral stenosis.   5. Tricuspid valve regurgitation is mild to moderate.    6. The aortic valve is tricuspid. There is mild calcification of the  aortic valve. Aortic valve regurgitation is moderate. No aortic stenosis  is present.   7. The inferior vena cava is normal in size with greater than 50%  respiratory variability, suggesting right atrial pressure of 3  mmHg.   Laboratory Data:  High Sensitivity Troponin:   Recent Labs  Lab 03/18/23 1600 03/18/23 1934  TROPONINIHS 6 8      Chemistry Recent Labs  Lab 03/18/23 1600 03/18/23 1932  NA 142  --   K 4.0  --   CL 112*  --   CO2 22  --   GLUCOSE 91  --   BUN 31*  --   CREATININE 1.50*  --   CALCIUM 9.5  --   MG  --  2.3  GFRNONAA 53*  --   ANIONGAP 8  --     No results for input(s): "PROT", "ALBUMIN", "AST", "ALT", "ALKPHOS", "BILITOT" in the last 168 hours. Lipids  Recent Labs  Lab 03/18/23 1932  CHOL 93  TRIG 83  HDL 38*  LDLCALC 38  CHOLHDL 2.4   Hematology Recent Labs  Lab 03/18/23 1600  WBC 11.4*  RBC 3.75*  HGB 12.2*  HCT 36.2*  MCV 96.5  MCH 32.5  MCHC 33.7  RDW 13.4  PLT 275   Thyroid  Recent Labs  Lab 03/18/23 1932  TSH 0.249*  FREET4 1.20*   BNPNo results for input(s): "BNP", "PROBNP" in the last 168 hours.  DDimer No results for input(s): "DDIMER" in the last 168 hours.   Radiology/Studies:  No results found.   Assessment and Plan:   Unstable angina/coronary artery disease with left main and RCA -Recent coronary CTA showed coronary calcium score of 11,175 (96 percentile), severe proximal left main stenosis (greater than 70%), moderate proximal left circumflex/OM1 stenosis (50 to 69%), mild RCA stenosis (25-49%).  FFR ostial left main 0.54 and RCA 0.73 -Presented to cardiology office on 12/6 and reported unstable angina and was sent to the emergency department -High-sensitivity troponins trended and negative -EKG was nonacute -He was continued on aspirin 81 mg daily, atorvastatin 20 mg daily -No beta-blocker therapy given baseline  bradycardia -Echocardiogram completed 03/17/2023 showed an LVEF of 60 to 65%, G1 DD, normal RV SF, mild MR, mild to moderate TR, and moderate AI -Left heart catheterization revealed severe multivessel disease of the left main and RCA -Continued on heparin infusion -Recommendations were to transfer to [facility] for CVTS evaluation for CABG -Continue with telemetry monitoring -EKG as needed for pain or changes  Hypertension -Blood pressure 157/59 -Continued on amlodipine 5 mg, metoprolol titrate 100 mg -Vital signs per unit protocol  Hyperlipidemia -LDL 38 -Continued on atorvastatin  Carotid artery stenosis -Status post stent placement to the right ICA July 2022 and left stent to CCA September 2022 -Carotid duplex in October 2024 showed bilateral ICA 40 to 59% stenosis, nonhemodynamically significant plaque less than 50% bilateral CCA -Continue on aspirin and statin  Hypothyroidism -Continued on levothyroxine  Gastroesophageal reflux disease -Continue PPI therapy  Tobacco abuse -Continued on NicoDerm CQ patches -Total cessation is recommended   Risk Assessment/Risk Scores:    TIMI Risk Score for Unstable Angina or Non-ST Elevation MI:   The patient's TIMI risk score is 4, which indicates a 20% risk of all cause mortality, new or recurrent myocardial infarction or need for urgent revascularization in the next 14 days.      Code Status: Full Code  Severity of Illness: The appropriate patient status for this patient is INPATIENT. Inpatient status is judged to be reasonable and necessary in order to provide the required intensity of service to ensure the patient's safety. The patient's presenting symptoms, physical exam findings, and initial radiographic and laboratory data in the  context of their chronic comorbidities is felt to place them at high risk for further clinical deterioration. Furthermore, it is not anticipated that the patient will be medically stable for  discharge from the hospital within 2 midnights of admission.   * I certify that at the point of admission it is my clinical judgment that the patient will require inpatient hospital care spanning beyond 2 midnights from the point of admission due to high intensity of service, high risk for further deterioration and high frequency of surveillance required.*   For questions or updates, please contact [facility] Please consult [email or web address] for contact info under     Signed, SHERI HAMMOCK, NP  03/21/2023 3:00 PM     ATTENDING ATTESTATION  I have seen, examined and evaluated the patient this morning initially on rounds and then following catheterization along with Lyla Son hammock, NP.  After reviewing all the available data and chart, we discussed the patients laboratory, study & physical findings as well as symptoms in detail.  I agree with her findings, examination as well as impression recommendations as per our discussion.    The patient was admitted over the weekend for unstable angina with known history of potentially significant left main disease on Coronary CTA.  He underwent cardiac catheterization today which confirmed ostial left main and RCA disease.  He subsequently being referred to [facility] for CVTS consultation.  Agree with the rest of the plan. We will start IV heparin based on ACS presentation. He has already had an echocardiogram performed.    [name], MD, MS Bryan Lemma, M.D., M.S. Interventional Cardiologist  [facility]  Pager # [phone] Phone # [phone] [address] [place], [place] [address]

## 2023-03-21 NOTE — Brief Op Note (Addendum)
   BRIEF cardiac CATHETERIZATION REPORT  03/21/2023 11:31 AM    PROCEDURE:  Procedure(s): LEFT HEART CATH AND CORONARY ANGIOGRAPHY (N/A)  SURGEON:  Surgeons and Role:   [name], MD - Primary   PATIENT:  [name]  62 y.o. male with hx of CAD (by Coronary CTA), PAD, carotid artery stenosis status post right ICA in July 2022 and left CCA stent in September 2022, hyperlipidemia, hypertension, TIA, right eye amaurosis fugax, hypothyroidism, tobacco abuse who is being seen 03/19/2023 for the evaluation of unstable angina.  He was seen by Dr. Debbe Odea on December 6 and was having significant chest pain walking into the clinic.  As such, was referred for admission for unstable angina and scheduled for catheterization today. - Echo 03/17/23 showed LVEF 60-65%, G1DD, normal RVSF mild MR, mild to mod TR, mod AI   PRE-OPERATIVE DIAGNOSIS:  unstable angina  POST-OPERATIVE DIAGNOSIS:   Severe ostial left main 70% stenosis with significant catheter dampening.  Otherwise modest to 50% mid OM lesion with good distal target.  LAD has a large diagonal branch which is also a good target as well as the main LAD.  No significant disease. Heavily calcified severe ostial RCA 70 to 80% stenosis with proximal 50% stenosis. Normal LVEDP  PROCEDURE PERFORMED Time Out: Verified patient identification, verified procedure, site/side was marked, verified correct patient position, special equipment/implants available, medications/allergies/relevent history reviewed, required imaging and test results available. Performed.  Access:  RIGHT radial Artery: 6 Fr sheath -- Seldinger technique using Micropuncture Kit -- Direct ultrasound guidance used.  Permanent image obtained and placed on chart. -- 10 mL radial cocktail IA; 3500 units IV Heparin  Left Heart Catheterization: 5 Fr Catheters advanced or exchanged over a J-wire under direct fluoroscopic guidance into the ascending aorta; TIG 4.0  catheter advanced first.  * LV Hemodynamics (LV Gram): TIG 4.0 catheter * Left Coronary Artery Cineangiography: TIG 4.0 catheter  * Right Coronary Artery Cineangiography: JR4 catheter   Review of initial angiography revealed: Severe ostial left main and ostial RCA disease  Preparations are made for referral for CVTS consultation  Upon completion of Angiogaphy, the catheter was removed completely out of the body over a wire, without complication.  Radial sheath removed in the Cardiac Catheterization lab with TR Band placed for hemostasis.  TR Band: 1120  Hours; 10 mL air; reverse Barbeau B  MEDICATIONS SQ Lidocaine 3 mL Radial Cocktail: 3 mg Verapmil in 10 mL NS Heparin: 3500 units   ANESTHESIA:   local and IV sedation; 3 mL SQ lidocaine; 2 mg Versed, 25 mg fentanyl  EBL:  <20 mL  BLOOD ADMINISTERED:none  COUNTS:  YES  PATIENT DISPOSITION:  PACU - hemodynamically stable.  DICTATION: .Note written in EPIC  PLAN OF CARE:  Will need to determine plan of care involving CVTS consultation, will discuss with CVTS scheduling to determine if the best course of action will be to transfer to [facility] for inpatient evaluation versus urgent outpatient evaluation.    Delay start of Pharmacological VTE agent (>24hrs) due to surgical blood loss or risk of bleeding: not applicable

## 2023-03-21 NOTE — Consult Note (Signed)
PHARMACY - ANTICOAGULATION CONSULT NOTE  Pharmacy Consult for heparin Indication: chest pain/ACS  No Known Allergies  Patient Measurements: Height: 5\' 6"  (167.6 cm) Weight: 76.7 kg (169 lb) IBW/kg (Calculated) : 63.8 Heparin Dosing Weight: 76.7 kg  Vital Signs: Temp: 98.8 F (37.1 C) (12/09 1410) Temp Source: Oral (12/09 1410) BP: 157/59 (12/09 1410) Pulse Rate: 76 (12/09 1410)  Labs: Recent Labs    03/18/23 1600 03/18/23 1934  HGB 12.2*  --   HCT 36.2*  --   PLT 275  --   CREATININE 1.50*  --   TROPONINIHS 6 8    Estimated Creatinine Clearance: 50.5 mL/min (A) (by C-G formula based on SCr of 1.5 mg/dL (H)).   Medical History: Past Medical History:  Diagnosis Date   Arthritis    Carotid artery disease (HCC)    seen by [facility]   Coronary artery disease    Hx of transient ischemic attack (TIA)    Hyperlipidemia    Hypertension    Thyroid disease    hypothyrodism    Medications:  No history of chronic anticoagulant use PTA  Assessment: 62 y.o. male with PMH hypertension, hyperlipidemia, PAD presenting after outpatient coronary CT showed severe CAD including left main stenosis and reporting chest pain in office visit. Cath performed to evaluate intervention needed 12/9. Determined to have severe left main and RCA disease which was recommended to be addressed with CABG. Cardiology arranging follow up care with CVTS who plans tentatively to accept patient for CABG 12/11. Patient and family were updated and agreeable to this plan. Patient is currently chest pain free and awaiting transfer. Medications guided by cardiology. Pharmacy has been consulted to initiate continuous heparin while awaiting transfer. Baseline labs have been ordered and are pending.  Goal of Therapy:  Heparin level 0.3-0.7 units/ml Monitor platelets by anticoagulation protocol: Yes   Plan:  - Since patient had recent procedure in which they received heparin, will avoid bolus  initially, but can bolus via nomogram thereafter - Start heparin infusion at 950 units/hr - Check anti-Xa level in 6 hours and daily while on heparin - Continue to monitor H&H and platelets  Ciin Brazzel A Donavon Kimrey 03/21/2023,3:58 PM

## 2023-03-22 ENCOUNTER — Inpatient Hospital Stay (HOSPITAL_COMMUNITY): Payer: Commercial Managed Care - PPO

## 2023-03-22 ENCOUNTER — Encounter: Payer: Self-pay | Admitting: Cardiology

## 2023-03-22 ENCOUNTER — Emergency Department (HOSPITAL_COMMUNITY): Admission: EM | Admit: 2023-03-22 | Payer: Commercial Managed Care - PPO | Source: Home / Self Care

## 2023-03-22 ENCOUNTER — Inpatient Hospital Stay (HOSPITAL_COMMUNITY)
Admission: AD | Admit: 2023-03-22 | Discharge: 2023-03-30 | DRG: 235 | Disposition: A | Discharge status: Home / Self Care | Payer: Commercial Managed Care - PPO | Source: Other Acute Inpatient Hospital | Attending: Thoracic Surgery (Cardiothoracic Vascular Surgery) | Admitting: Thoracic Surgery (Cardiothoracic Vascular Surgery)

## 2023-03-22 DIAGNOSIS — J9811 Atelectasis: Secondary | ICD-10-CM | POA: Diagnosis not present

## 2023-03-22 DIAGNOSIS — Z833 Family history of diabetes mellitus: Secondary | ICD-10-CM | POA: Diagnosis not present

## 2023-03-22 DIAGNOSIS — I2585 Chronic coronary microvascular dysfunction: Secondary | ICD-10-CM

## 2023-03-22 DIAGNOSIS — D72829 Elevated white blood cell count, unspecified: Secondary | ICD-10-CM | POA: Diagnosis not present

## 2023-03-22 DIAGNOSIS — I129 Hypertensive chronic kidney disease with stage 1 through stage 4 chronic kidney disease, or unspecified chronic kidney disease: Secondary | ICD-10-CM | POA: Diagnosis present

## 2023-03-22 DIAGNOSIS — Z79899 Other long term (current) drug therapy: Secondary | ICD-10-CM

## 2023-03-22 DIAGNOSIS — R6510 Systemic inflammatory response syndrome (SIRS) of non-infectious origin without acute organ dysfunction: Secondary | ICD-10-CM | POA: Diagnosis not present

## 2023-03-22 DIAGNOSIS — Z951 Presence of aortocoronary bypass graft: Secondary | ICD-10-CM

## 2023-03-22 DIAGNOSIS — N179 Acute kidney failure, unspecified: Secondary | ICD-10-CM | POA: Diagnosis not present

## 2023-03-22 DIAGNOSIS — E039 Hypothyroidism, unspecified: Secondary | ICD-10-CM | POA: Diagnosis present

## 2023-03-22 DIAGNOSIS — I9581 Postprocedural hypotension: Secondary | ICD-10-CM | POA: Diagnosis not present

## 2023-03-22 DIAGNOSIS — Z7982 Long term (current) use of aspirin: Secondary | ICD-10-CM | POA: Diagnosis not present

## 2023-03-22 DIAGNOSIS — Z8249 Family history of ischemic heart disease and other diseases of the circulatory system: Secondary | ICD-10-CM

## 2023-03-22 DIAGNOSIS — Z8673 Personal history of transient ischemic attack (TIA), and cerebral infarction without residual deficits: Secondary | ICD-10-CM | POA: Diagnosis not present

## 2023-03-22 DIAGNOSIS — J9601 Acute respiratory failure with hypoxia: Secondary | ICD-10-CM | POA: Diagnosis not present

## 2023-03-22 DIAGNOSIS — E782 Mixed hyperlipidemia: Secondary | ICD-10-CM | POA: Diagnosis present

## 2023-03-22 DIAGNOSIS — F17210 Nicotine dependence, cigarettes, uncomplicated: Secondary | ICD-10-CM | POA: Diagnosis present

## 2023-03-22 DIAGNOSIS — N1831 Chronic kidney disease, stage 3a: Secondary | ICD-10-CM | POA: Diagnosis present

## 2023-03-22 DIAGNOSIS — I25110 Atherosclerotic heart disease of native coronary artery with unstable angina pectoris: Secondary | ICD-10-CM | POA: Diagnosis present

## 2023-03-22 DIAGNOSIS — K219 Gastro-esophageal reflux disease without esophagitis: Secondary | ICD-10-CM | POA: Diagnosis present

## 2023-03-22 DIAGNOSIS — I2510 Atherosclerotic heart disease of native coronary artery without angina pectoris: Principal | ICD-10-CM | POA: Diagnosis present

## 2023-03-22 DIAGNOSIS — Z79890 Hormone replacement therapy (postmenopausal): Secondary | ICD-10-CM | POA: Diagnosis not present

## 2023-03-22 DIAGNOSIS — Z01810 Encounter for preprocedural cardiovascular examination: Secondary | ICD-10-CM

## 2023-03-22 DIAGNOSIS — J9382 Other air leak: Secondary | ICD-10-CM | POA: Diagnosis not present

## 2023-03-22 LAB — BASIC METABOLIC PANEL
Anion gap: 8 (ref 5–15)
BUN: 26 mg/dL — ABNORMAL HIGH (ref 8–23)
CO2: 20 mmol/L — ABNORMAL LOW (ref 22–32)
Calcium: 9 mg/dL (ref 8.9–10.3)
Chloride: 109 mmol/L (ref 98–111)
Creatinine, Ser: 1.4 mg/dL — ABNORMAL HIGH (ref 0.61–1.24)
GFR, Estimated: 57 mL/min — ABNORMAL LOW (ref >60)
Glucose, Bld: 113 mg/dL — ABNORMAL HIGH (ref 70–99)
Potassium: 4.1 mmol/L (ref 3.5–5.1)
Sodium: 137 mmol/L (ref 135–145)

## 2023-03-22 LAB — CBC
HCT: 33 % — ABNORMAL LOW (ref 39.0–52.0)
Hemoglobin: 11.6 g/dL — ABNORMAL LOW (ref 13.0–17.0)
MCH: 33 pg (ref 26.0–34.0)
MCHC: 35.2 g/dL (ref 30.0–36.0)
MCV: 94 fL (ref 80.0–100.0)
Platelets: 235 10*3/uL (ref 150–400)
RBC: 3.51 MIL/uL — ABNORMAL LOW (ref 4.22–5.81)
RDW: 13.1 % (ref 11.5–15.5)
WBC: 9.1 10*3/uL (ref 4.0–10.5)
nRBC: 0 % (ref 0.0–0.2)

## 2023-03-22 LAB — SURGICAL PCR SCREEN
MRSA, PCR: NEGATIVE
Staphylococcus aureus: POSITIVE — AB

## 2023-03-22 LAB — HEPARIN LEVEL (UNFRACTIONATED)
Heparin Unfractionated: 0.58 [IU]/mL (ref 0.30–0.70)
Heparin Unfractionated: 0.44 [IU]/mL (ref 0.30–0.70)

## 2023-03-22 LAB — ABO/RH: ABO/RH(D): A POS

## 2023-03-22 MED ORDER — CEFAZOLIN SODIUM-DEXTROSE 2-4 GM/100ML-% IV SOLN
2.0000 g | INTRAVENOUS | Status: DC
  Filled 2023-03-22: qty 100

## 2023-03-22 MED ORDER — PLASMA-LYTE A IV SOLN
INTRAVENOUS | Status: DC
  Filled 2023-03-22: qty 2.5

## 2023-03-22 MED ORDER — CHLORHEXIDINE GLUCONATE 0.12 % MT SOLN
15.0000 mL | Freq: Once | OROMUCOSAL | Status: AC
  Administered 2023-03-23: 15 mL via OROMUCOSAL
  Filled 2023-03-22: qty 15

## 2023-03-22 MED ORDER — TRANEXAMIC ACID (OHS) PUMP PRIME SOLUTION
2.0000 mg/kg | INTRAVENOUS | Status: DC
  Filled 2023-03-22: qty 1.56

## 2023-03-22 MED ORDER — TRANEXAMIC ACID (OHS) BOLUS VIA INFUSION
15.0000 mg/kg | INTRAVENOUS | Status: DC
  Filled 2023-03-22: qty 1170

## 2023-03-22 MED ORDER — NITROGLYCERIN IN D5W 200-5 MCG/ML-% IV SOLN
2.0000 ug/min | INTRAVENOUS | Status: DC
  Filled 2023-03-22: qty 250

## 2023-03-22 MED ORDER — MILRINONE LACTATE IN DEXTROSE 20-5 MG/100ML-% IV SOLN
0.3000 ug/kg/min | INTRAVENOUS | Status: DC
  Filled 2023-03-22: qty 100

## 2023-03-22 MED ORDER — ATORVASTATIN CALCIUM 10 MG PO TABS
20.0000 mg | ORAL_TABLET | Freq: Every day | ORAL | Status: DC
  Filled 2023-03-22: qty 2

## 2023-03-22 MED ORDER — VANCOMYCIN HCL 1250 MG/250ML IV SOLN
1250.0000 mg | INTRAVENOUS | Status: DC
  Filled 2023-03-22: qty 250

## 2023-03-22 MED ORDER — POTASSIUM CHLORIDE 2 MEQ/ML IV SOLN
80.0000 meq | INTRAVENOUS | Status: DC
  Filled 2023-03-22: qty 40

## 2023-03-22 MED ORDER — HEPARIN 30,000 UNITS/1000 ML (OHS) CELLSAVER SOLUTION
Status: DC
  Filled 2023-03-22: qty 1000

## 2023-03-22 MED ORDER — TRANEXAMIC ACID 1000 MG/10ML IV SOLN
1.5000 mg/kg/h | INTRAVENOUS | Status: AC
  Administered 2023-03-23: 1.5 mg/kg/h via INTRAVENOUS
  Filled 2023-03-22: qty 25

## 2023-03-22 MED ORDER — HEPARIN (PORCINE) 25000 UT/250ML-% IV SOLN
1100.0000 [IU]/h | INTRAVENOUS | Status: DC
Start: 2023-03-22 — End: 2023-03-23

## 2023-03-22 MED ORDER — VANCOMYCIN HCL 1250 MG/250ML IV SOLN
1250.0000 mg | INTRAVENOUS | Status: AC
  Administered 2023-03-23: 1250 mg via INTRAVENOUS
  Filled 2023-03-22: qty 250

## 2023-03-22 MED ORDER — PHENYLEPHRINE HCL-NACL 20-0.9 MG/250ML-% IV SOLN
30.0000 ug/min | INTRAVENOUS | Status: DC
  Filled 2023-03-22: qty 250

## 2023-03-22 MED ORDER — DEXMEDETOMIDINE HCL IN NACL 400 MCG/100ML IV SOLN
0.1000 ug/kg/h | INTRAVENOUS | Status: AC
  Administered 2023-03-23: .7 ug/kg/h via INTRAVENOUS
  Filled 2023-03-22: qty 100

## 2023-03-22 MED ORDER — MUPIROCIN 2 % EX OINT
1.0000 | TOPICAL_OINTMENT | Freq: Two times a day (BID) | CUTANEOUS | Status: DC
  Administered 2023-03-22: 1 via NASAL
  Filled 2023-03-22: qty 22

## 2023-03-22 MED ORDER — BISACODYL 5 MG PO TBEC
5.0000 mg | DELAYED_RELEASE_TABLET | Freq: Once | ORAL | Status: AC
  Administered 2023-03-22: 5 mg via ORAL
  Filled 2023-03-22: qty 1

## 2023-03-22 MED ORDER — NOREPINEPHRINE 4 MG/250ML-% IV SOLN
0.0000 ug/min | INTRAVENOUS | Status: DC
  Filled 2023-03-22: qty 250

## 2023-03-22 MED ORDER — CHLORHEXIDINE GLUCONATE CLOTH 2 % EX PADS
6.0000 | MEDICATED_PAD | Freq: Every day | CUTANEOUS | Status: DC
  Administered 2023-03-23: 6 via TOPICAL

## 2023-03-22 MED ORDER — CHLORHEXIDINE GLUCONATE CLOTH 2 % EX PADS
6.0000 | MEDICATED_PAD | Freq: Once | CUTANEOUS | Status: AC
Start: 2023-03-22 — End: 2023-03-22
  Administered 2023-03-22: 6 via TOPICAL

## 2023-03-22 MED ORDER — EPINEPHRINE HCL 5 MG/250ML IV SOLN IN NS
0.0000 ug/min | INTRAVENOUS | Status: DC
  Filled 2023-03-22: qty 250

## 2023-03-22 MED ORDER — AMLODIPINE BESYLATE 5 MG PO TABS
5.0000 mg | ORAL_TABLET | Freq: Every day | ORAL | Status: DC
  Filled 2023-03-22: qty 1

## 2023-03-22 MED ORDER — MANNITOL 20 % IV SOLN
INTRAVENOUS | Status: DC
  Filled 2023-03-22: qty 13

## 2023-03-22 MED ORDER — TRANEXAMIC ACID (OHS) BOLUS VIA INFUSION
15.0000 mg/kg | INTRAVENOUS | Status: AC
  Administered 2023-03-23: 1170 mg via INTRAVENOUS
  Filled 2023-03-22: qty 1170

## 2023-03-22 MED ORDER — METOPROLOL TARTRATE 12.5 MG HALF TABLET
12.5000 mg | ORAL_TABLET | Freq: Once | ORAL | Status: AC
  Administered 2023-03-23: 12.5 mg via ORAL
  Filled 2023-03-22: qty 1

## 2023-03-22 MED ORDER — PLASMA-LYTE A IV SOLN
INTRAVENOUS | Status: DC
  Filled 2023-03-22: qty 5

## 2023-03-22 MED ORDER — ASPIRIN 81 MG PO TBEC
81.0000 mg | DELAYED_RELEASE_TABLET | Freq: Every day | ORAL | Status: DC
  Filled 2023-03-22: qty 1

## 2023-03-22 MED ORDER — PANTOPRAZOLE SODIUM 40 MG PO TBEC
40.0000 mg | DELAYED_RELEASE_TABLET | Freq: Every day | ORAL | Status: DC
  Filled 2023-03-22: qty 1

## 2023-03-22 MED ORDER — DIAZEPAM 5 MG PO TABS
5.0000 mg | ORAL_TABLET | Freq: Once | ORAL | Status: AC
  Administered 2023-03-23: 5 mg via ORAL
  Filled 2023-03-22: qty 1

## 2023-03-22 MED ORDER — INSULIN REGULAR(HUMAN) IN NACL 100-0.9 UT/100ML-% IV SOLN
INTRAVENOUS | Status: AC
  Administered 2023-03-23: 1 [IU]/h via INTRAVENOUS
  Filled 2023-03-22: qty 100

## 2023-03-22 MED ORDER — LEVOTHYROXINE SODIUM 25 MCG PO TABS
125.0000 ug | ORAL_TABLET | Freq: Every day | ORAL | Status: DC
  Administered 2023-03-23: 125 ug via ORAL
  Filled 2023-03-22: qty 1

## 2023-03-22 MED ORDER — DEXMEDETOMIDINE HCL IN NACL 400 MCG/100ML IV SOLN
0.1000 ug/kg/h | INTRAVENOUS | Status: DC
  Filled 2023-03-22: qty 100

## 2023-03-22 MED ORDER — TRANEXAMIC ACID 1000 MG/10ML IV SOLN
1.5000 mg/kg/h | INTRAVENOUS | Status: DC
  Filled 2023-03-22: qty 25

## 2023-03-22 MED ORDER — CHLORHEXIDINE GLUCONATE CLOTH 2 % EX PADS
6.0000 | MEDICATED_PAD | Freq: Once | CUTANEOUS | Status: AC
  Administered 2023-03-23: 6 via TOPICAL

## 2023-03-22 MED ORDER — INSULIN REGULAR(HUMAN) IN NACL 100-0.9 UT/100ML-% IV SOLN
INTRAVENOUS | Status: DC
  Filled 2023-03-22: qty 100

## 2023-03-22 MED ORDER — CEFAZOLIN SODIUM-DEXTROSE 2-4 GM/100ML-% IV SOLN
2.0000 g | INTRAVENOUS | Status: AC
  Administered 2023-03-23 (×2): 2 g via INTRAVENOUS
  Filled 2023-03-22: qty 100

## 2023-03-22 MED ORDER — NICOTINE 21 MG/24HR TD PT24
21.0000 mg | MEDICATED_PATCH | Freq: Every day | TRANSDERMAL | Status: DC
  Filled 2023-03-22: qty 1

## 2023-03-22 MED ORDER — PHENYLEPHRINE HCL-NACL 20-0.9 MG/250ML-% IV SOLN
30.0000 ug/min | INTRAVENOUS | Status: AC
  Administered 2023-03-23: 20 ug/min via INTRAVENOUS
  Filled 2023-03-22: qty 250

## 2023-03-22 NOTE — Consult Note (Cosign Needed)
[address]       [place] [address]             [phone]        [name] [facility] Medical Record #[id number] Date of Birth: Dec 06, 1960  Referring: [name], MD Primary Care: Lorn Junes, FNP (Inactive) Primary Cardiologist:Brian Azucena Cecil, MD  Chief Complaint:   Angina   History of Present Illness:    The patient is a 62 year old male we are asked to see in CT surgical consultation for consideration of coronary artery bypass grafting.  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  He underwent coronary CTA in December which showed a significantly elevated coronary calcium score of 1175 which was 97 percentile.  It showed evidence of severe proximal left main stenosis as well as circumflex and obtuse marginal 1 disease.  There was mild RCA stenosis.  FFR was significant for the ostial left main and RCA lesions.  He describes chest pain with exertion for the past approximate 2 months.  Primarily it is midsternal but there is some radiation to neck and left shoulder.  He denies palpitations or lower extremity edema.  He denies orthopnea.  He does get shortness of breath with these events and they have been worsening over time.  Symptoms typically resolve after about 5 minutes of resting.  He works in [employer] and his job is physically taxing.  On an office visit on 03/18/2023 he reported symptoms consistent with unstable angina and was sent to the emergency room.  He was admitted for further evaluation and treatment to include cardiac catheterization.  Cardiac catheterization done 03/21/2023 confirmed the ostial left main stenosis at 70% with significant catheter dampening.  There was also a mid 50-60% LAD lesion.  He was found to have severe ostial RCA disease in the 70 to 80% range.  He has a normal  LVEDP.  Echocardiogram shows normal LV function with no significant valvular disease.  The report is as described below.  He was felt best suited in terms of coronary revascularization to undergo CABG and was transferred to [facility] for surgical evaluation.    Current Activity/ Functional Status: Patient is independent with mobility/ambulation, transfers, ADL's, IADL's.   Zubrod Score: At the time of surgery this patient's most appropriate activity status/level should be described as: []     0    Normal activity, no symptoms [x]     1    Restricted in physical strenuous activity but ambulatory, able to do out light work []     2    Ambulatory and capable of self care, unable to do work activities, up and about                 more than 50%  Of the time                            []     3    Only limited self care, in bed greater than 50% of waking hours []     4    Completely disabled, no self care, confined to bed or chair []     5    Moribund  Past Medical History:  Diagnosis Date   Arthritis    Carotid artery disease (HCC)    seen by [facility]  Coronary artery disease    Hx of transient ischemic attack (TIA)    Hyperlipidemia    Hypertension    Thyroid disease    hypothyrodism    Past Surgical History:  Procedure Laterality Date   BACK SURGERY     CAROTID PTA/STENT INTERVENTION Right 10/22/2020   Procedure: CAROTID PTA/STENT INTERVENTION;  Surgeon: Renford Dills, MD;  Location: [facility] INVASIVE CV LAB;  Service: Cardiovascular;  Laterality: Right;   CAROTID PTA/STENT INTERVENTION Left 12/17/2020   Procedure: CAROTID PTA/STENT INTERVENTION;  Surgeon: Renford Dills, MD;  Location: [facility] INVASIVE CV LAB;  Service: Cardiovascular;  Laterality: Left;   CATARACT EXTRACTION W/PHACO Right 08/18/2022   Procedure: CATARACT EXTRACTION PHACO AND INTRAOCULAR LENS PLACEMENT (IOC) RIGHT MALYUGIN 4.19 00:24.5;  Surgeon: Lockie Mola, MD;  Location[facility];  Service: Ophthalmology;  Laterality: Right;   LEFT HEART CATH AND CORONARY ANGIOGRAPHY N/A 03/21/2023   Procedure: LEFT HEART CATH AND CORONARY ANGIOGRAPHY;  Surgeon: [name], MD;  Location: [facility] INVASIVE CV LAB;  Service: Cardiovascular;  Laterality: N/A;    Social History   Tobacco Use  Smoking Status Every Day   Current packs/day: 1.00   Average packs/day: 1 pack/day for 47.0 years (47.0 ttl pk-yrs)   Types: Cigarettes  Smokeless Tobacco Never    Social History   Substance and Sexual Activity  Alcohol Use Never     No Known Allergies  Current Facility-Administered Medications  Medication Dose Route Frequency Provider Last Rate Last Admin   amLODipine (NORVASC) tablet 5 mg  5 mg Oral Daily Laverda Page B, NP       aspirin EC tablet 81 mg  81 mg Oral Daily Laverda Page B, NP       atorvastatin (LIPITOR) tablet 20 mg  20 mg Oral Daily Laverda Page B, NP       heparin ADULT infusion 100 units/mL (25000 units/290mL)  1,100 Units/hr Intravenous Continuous Rollene Fare, RPH 11 mL/hr at 03/22/23 1647 1,100 Units/hr at 03/22/23 1647   [START ON 03/23/2023] levothyroxine (SYNTHROID) tablet 125 mcg  125 mcg Oral Q0600 Arty Baumgartner, NP       nicotine (NICODERM CQ - dosed in mg/24 hours) patch 21 mg  21 mg Transdermal Daily Laverda Page B, NP       pantoprazole (PROTONIX) EC tablet 40 mg  40 mg Oral Daily Laverda Page B, NP        Medications Prior to Admission  Medication Sig Dispense Refill Last Dose   acetaminophen (TYLENOL) 500 MG tablet Take 1,000 mg by mouth every 6 (six) hours as needed for mild pain.      amLODipine (NORVASC) 5 MG tablet Take 5 mg by mouth daily.      aspirin EC 81 MG EC tablet Take 1 tablet (81 mg total) by mouth at bedtime. Swallow whole. 30 tablet 11    atorvastatin (LIPITOR) 40 MG tablet Take 40 mg by mouth at bedtime.      levothyroxine (SYNTHROID) 125 MCG tablet Take 1 tablet (125 mcg total) by mouth daily at  6 (six) AM.      metoprolol tartrate (LOPRESSOR) 100 MG tablet Take 1 tablet (100 mg total) by mouth once for 1 dose. TAKE TWO HOURS PRIOR TO CARDIAC CTA 1 tablet 0    nitroGLYCERIN (NITROSTAT) 0.4 MG SL tablet Place 1 tablet (0.4 mg total) under the tongue every 5 (five) minutes as needed for chest pain. 30 tablet 0     Family  History  Problem Relation Age of Onset   Hypothyroidism Mother    Diabetes Mother    Heart attack Mother    Cancer Father    Heart disease Sister    Hypothyroidism Sister    Heart disease Brother    Hypothyroidism Brother      Review of Systems:   Review of Systems  Constitutional:  Positive for malaise/fatigue.  HENT:  Positive for tinnitus.   Eyes:        Status post right eye cataract surgery  Respiratory:  Positive for shortness of breath.   Cardiovascular:  Positive for chest pain.  Gastrointestinal:  Positive for heartburn.  Musculoskeletal:  Positive for myalgias.  Neurological:  Positive for dizziness.        Physical Exam: BP (!) 172/66 (BP Location: Left Arm)   Pulse 88   Temp 99.1 F (37.3 C) (Oral)   Resp 18   Ht 5\' 6"  (1.676 m)   Wt 78 kg   SpO2 100%   BMI 27.75 kg/m    General appearance: alert, cooperative, and no distress Head: Normocephalic, without obvious abnormality, atraumatic Neck: no adenopathy, no JVD, supple, symmetrical, trachea midline, thyroid not enlarged, symmetric, no tenderness/mass/nodules, and bilateral carotid bruits Lymph nodes: Cervical, supraclavicular, and axillary nodes normal. Resp: clear to auscultation bilaterally Back: symmetric, no curvature. ROM normal. No CVA tenderness. Cardio: regular rate and rhythm, S1, S2 normal, no murmur, click, rub or gallop GI: soft, non-tender; bowel sounds normal; no masses,  no organomegaly Extremities: extremities normal, atraumatic, no cyanosis or edema and palpable pulses Neurologic: Grossly normal  Diagnostic Studies & Laboratory data:     Recent  Radiology Findings:   CARDIAC CATHETERIZATION  Result Date: 03/21/2023   Ost RCA lesion is 70% stenosed.   Prox RCA lesion is 50% stenosed.   Ost LM lesion is 75% stenosed.   1st Mrg lesion is 50% stenosed.   LV end diastolic pressure is normal.   There is no aortic valve stenosis. POST-OPERATIVE DIAGNOSIS:  Severe ostial left main 70% stenosis with significant catheter dampening.  Otherwise modest to 50% mid OM lesion with good distal target.  LAD has a large diagonal branch which is also a good target as well as the main LAD.  No significant disease. Heavily calcified severe ostial RCA 70 to 80% stenosis with proximal 50% stenosis. Normal LVEDP with normal EF by recent Echo RECOMMENDATIONS   The patient be transferred to [facility] for CVTS consultation Will initiate IV heparin patient with ACS presentation Continue aggressive GDMT CV Risk Factor Modification   Recommend Aspirin 81mg  daily for moderate CAD.   Could consider adding clopidogrel prior to discharge post CABG         ECHOCARDIOGRAM REPORT       Patient Name:   [name] Date of Exam: 03/17/2023  Medical Rec #:  [id number]         Height:       66.0 in  Accession #:    [id number]        Weight:       175.6 lb  Date of Birth:  [birth year]         BSA:          1.892 m  Patient Age:    62 years          BP:           148/62 mmHg  Patient Gender: M  HR:           63 bpm.  Exam Location:  [facility]   Procedure: 2D Echo, 3D Echo, Cardiac Doppler, Color Doppler and Strain  Analysis   Indications:   R07.9* Chest pain, unspecified    History:        Patient has no prior history of Echocardiogram  examinations.                 CAD, TIA, Signs/Symptoms:Chest Pain; Risk  Factors:Hypertension                 and Dyslipidemia.    Sonographer:    Ilda Mori MHA, BS, RDCS  Referring Phys: [id number] BRIAN AGBOR-ETANG   IMPRESSIONS     1. Left ventricular ejection fraction, by estimation, is 60 to 65%.  The  left ventricle has normal function. The left ventricle has no regional  wall motion abnormalities. Left ventricular diastolic parameters are  consistent with Grade I diastolic  dysfunction (impaired relaxation). The average left ventricular global  longitudinal strain is -21.8 %.   2. Right ventricular systolic function is normal. The right ventricular  size is normal. There is normal pulmonary artery systolic pressure. The  estimated right ventricular systolic pressure is 32.9 mmHg.   3. Left atrial size was mild to moderately dilated.   4. The mitral valve is normal in structure. Mild mitral valve  regurgitation. No evidence of mitral stenosis.   5. Tricuspid valve regurgitation is mild to moderate.   6. The aortic valve is tricuspid. There is mild calcification of the  aortic valve. Aortic valve regurgitation is moderate. No aortic stenosis  is present.   7. The inferior vena cava is normal in size with greater than 50%  respiratory variability, suggesting right atrial pressure of 3 mmHg.   FINDINGS   Left Ventricle: Left ventricular ejection fraction, by estimation, is 60  to 65%. The left ventricle has normal function. The left ventricle has no  regional wall motion abnormalities. The average left ventricular global  longitudinal strain is -21.8 %.  The left ventricular internal cavity size was normal in size. There is no  left ventricular hypertrophy. Left ventricular diastolic parameters are  consistent with Grade I diastolic dysfunction (impaired relaxation).   Right Ventricle: The right ventricular size is normal. No increase in  right ventricular wall thickness. Right ventricular systolic function is  normal. There is normal pulmonary artery systolic pressure. The tricuspid  regurgitant velocity is 2.64 m/s, and   with an assumed right atrial pressure of 5 mmHg, the estimated right  ventricular systolic pressure is 32.9 mmHg.   Left Atrium: Left atrial size was mild  to moderately dilated.   Right Atrium: Right atrial size was normal in size.   Pericardium: There is no evidence of pericardial effusion.   Mitral Valve: The mitral valve is normal in structure. Mild mitral valve  regurgitation. No evidence of mitral valve stenosis.   Tricuspid Valve: The tricuspid valve is normal in structure. Tricuspid  valve regurgitation is mild to moderate. No evidence of tricuspid  stenosis.   Aortic Valve: The aortic valve is tricuspid. There is mild calcification  of the aortic valve. Aortic valve regurgitation is moderate. Aortic  regurgitation PHT measures 425 msec. No aortic stenosis is present.   Pulmonic Valve: The pulmonic valve was normal in structure. Pulmonic valve  regurgitation is mild. No evidence of pulmonic stenosis.   Aorta: The aortic root is normal in size and structure.  Venous: The inferior vena cava is normal in size with greater than 50%  respiratory variability, suggesting right atrial pressure of 3 mmHg.   IAS/Shunts: No atrial level shunt detected by color flow Doppler.     LEFT VENTRICLE  PLAX 2D  LVIDd:         5.00 cm Diastology  LVIDs:         3.00 cm LV e' medial:    8.27 cm/s  LV PW:         1.00 cm LV E/e' medial:  12.3  LV IVS:        1.00 cm LV e' lateral:   9.90 cm/s                         LV E/e' lateral: 10.3                           2D Longitudinal Strain                         2D Strain GLS Avg:     -21.8 %                           3D Volume EF:                         3D EF:        63 %                         LV EDV:       163 ml                         LV ESV:       60 ml                         LV SV:        102 ml   RIGHT VENTRICLE  RV Basal diam:  3.70 cm  RV Mid diam:    3.20 cm  RV S prime:     20.20 cm/s  TAPSE (M-mode): 1.7 cm   LEFT ATRIUM             Index        RIGHT ATRIUM           Index  LA diam:        4.30 cm 2.27 cm/m   RA Area:     20.80 cm  LA Vol (A2C):   87.0 ml 45.98  ml/m  RA Volume:   58.90 ml  31.13 ml/m  LA Vol (A4C):   61.1 ml 32.29 ml/m  LA Biplane Vol: 75.0 ml 39.64 ml/m   AORTIC VALVE  AI PHT:      425 msec    AORTA  Ao Root diam: 3.40 cm  Ao Asc diam:  3.10 cm   MITRAL VALVE                TRICUSPID VALVE  MV Area (PHT): 4.06 cm     TR Peak grad:   27.9 mmHg  MV Decel Time: 187 msec     TR Vmax:        264.00 cm/s  MV E  velocity: 102.00 cm/s  MV A velocity: 87.50 cm/s  MV E/A ratio:  1.17   Julien Nordmann MD  Electronically signed by Julien Nordmann MD  Signature Date/Time: 03/17/2023/4:04:49 PM        I have independently reviewed the above radiologic studies and discussed with the patient   Recent Lab Findings: Lab Results  Component Value Date   WBC 9.1 03/22/2023   HGB 11.6 (L) 03/22/2023   HCT 33.0 (L) 03/22/2023   PLT 235 03/22/2023   GLUCOSE 113 (H) 03/22/2023   CHOL 93 03/18/2023   TRIG 83 03/18/2023   HDL 38 (L) 03/18/2023   LDLCALC 38 03/18/2023   NA 137 03/22/2023   K 4.1 03/22/2023   CL 109 03/22/2023   CREATININE 1.40 (H) 03/22/2023   BUN 26 (H) 03/22/2023   CO2 20 (L) 03/22/2023   TSH 0.249 (L) 03/18/2023   INR 1.1 03/21/2023   HGBA1C 5.5 03/18/2023      Assessment / Plan: Severe left main and two-vessel coronary artery disease with unstable angina. 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 History of TIA Hyperlipidemia Hypertension Hypothyroidism Tobacco abuse Arthritis GERD   Plan: The patient appears to be a candidate to proceed with CABG. he will have routine preop testing.  Plan at current time is for possible bilateral mammary grafts.  The surgeon will evaluate the patient and relevant studies and make final determinations.    I  spent 30 minutes counseling the patient face to face.   Rowe Clack, PA-C  03/22/2023 5:40 PM  Patient seen and examined, agree with above For CABG this AM All questions answered  Viviann Spare C. Dorris Fetch,  MD [facility] [phone]

## 2023-03-22 NOTE — Progress Notes (Addendum)
03/22/23 1529  Charting Type  Charting Type Admission/Transfer Assessment  [place] Work Intensity Score (Update with each assessment and as needed)  Work Intensity Score (Level) 2  Level 2 Intensity S.Meds scheduled or prns 2-4 hours  Neurological  Neuro (WDL) WDL  NuDESC - Delirium Risk Factor Assessment (Complete for non-ICU patients)  Delirium Risk Factor Assessment No risk factors identified - Delirium assessment complete  NuDESC - Nursing Delirium Screening Scale (Complete for non-ICU patients)  Disorientation 0  Inappropriate Behavior 0  Inappropriate Communications 0  Illusions/hallucinations 0  Psychomotor Retardation 0  NuDESC Total Score 0  NuDESC - Delirium Prevention:  Universal Requirements (Complete for all non-ICU patients with a delirium risk factor)  Universal Precautions Initiated *See Row Information* Yes  Oral Assessment (Complete on admission/transfer/every shift)  Oral Assessment  (WDL) WDL  Is patient on any of following O2 devices? None of the above  Nutritional status No high risk factors  Oral Assessment Risk  Low Risk  HEENT  HEENT (WDL) WDL  Vision Check No  R Eye Impaired vision;Other (Comment) (wear glasses)  Voice Clear  Respiratory  Respiratory (WDL) WDL  Respiratory Pattern Regular;Unlabored  Chest Assessment Chest expansion symmetrical  Bilateral Breath Sounds Clear  Cardiac  Cardiac (WDL) WDL  ECG Monitor Yes  Pulse Regular  Heart Sounds S1, S2  Jugular Venous Distention (JVD) No  ECG Monitoring  ECG Heart Rate 88  Antiarrhythmic device  Antiarrhythmic device No  Vascular  Vascular (WDL) X  Pulses R radial;L radial;R dorsalis pedis;L dorsalis pedis  First Vascular Site Assessment  #1 - Location of Site Assessment Right radial  #1 - Vascular Site Assessment Scale Level 0  RUE Neurovascular Assessment  R Radial Pulse +2  LUE Neurovascular Assessment  L Radial Pulse +2  RLE Neurovascular Assessment  R Dorsalis Pedis Pulse  +2  LLE Neurovascular Assessment  L Dorsalis Pedis Pulse +2  Musculoskeletal  Musculoskeletal (WDL) WDL  Assistive Device Other (Comment) (iv pole)  Gastrointestinal  Gastrointestinal (WDL) WDL  Last BM Date  03/18/23  GU Assessment  Genitourinary (WDL) WDL  Genitalia  Male Genitalia Intact  Psychosocial  Psychosocial (WDL) WDL  Integumentary  Integumentary (WDL) X  Skin Color Appropriate for ethnicity  Skin Integrity Ecchymosis  Ecchymosis Location Abdomen;Arm  Ecchymosis Location Orientation Bilateral  Skin Turgor Non-tenting  Sacral Foam Prophylactic Dressing  Dressing Interventions Skin assessed under dressing  Medical Device Prophylactic Dressing  Skin Assessed Under All Medical Device Prophylactic Dressings (if applicable) Done - Skin Intact  Mobility Assessment: RN to perform for Med/Surg, Progressive Care, Stepdown, & Geropsych at admission/transfer/every shift/prn  Does patient have an order for bedrest or is patient medically unstable No - Continue assessment  What is the highest level of mobility based on the progressive mobility assessment? Level 6 (Walks independently in room and hall) - Balance while walking in room without assist - Complete  Braden Scale (Ages 8 and up)  Sensory Perceptions 4  Moisture 4  Activity 4  Mobility 4  Nutrition 4  Friction and Shear 3  Braden Scale Score 23  Braden Interventions  Braden Scale Interventions No interventions  Neurological  Level of Consciousness Alert   Patient arrived [facility], patient ambulating to bathroom. VS obtained and stable. Tele monitor applied, CCMD made aware however unable to transfer patient onto the unit. Patient is alert and oriented x 4 and has no pain. Heparin gtt has been infusing at 47ml/hr upon arrival. [facility] admissions made  aware of patient arrival to [facility] Call bell within reach. Patient notified family of arrival.

## 2023-03-22 NOTE — Progress Notes (Signed)
      [address]       [name] [address]             [phone]      Full consult to follow  62 yo man with hx of tobacco abuse, hypertension, hyperlipidemia, ECCOD, TIA, hypothyroidism presents with angina.  Cath shows severe ostial left main and RCA disease  On exam cardiac RRR, no murmur  Lungs clear  Bilateral carotid bruits    Reviewed cath images.  CABG indicated for survival benefit and relief of symptoms.  I discussed the general nature of the procedure, including the need for general anesthesia, the incisions to be used, the use of cardiopulmonary bypass, and the uses of drainage tubes and temporary pacemaker wires postoperatively with Mr. Lemmen.  We discussed the expected hospital stay, overall recovery and short and long term outcomes. I informed him of the indications, risks, benefits and alternatives.  He understands the risks include, but are not limited to death, stroke, MI, DVT/PE, bleeding, possible need for transfusion, infections, cardiac arrhythmias as well as other organ system dysfunction including respiratory, renal, or GI complications.   He accepts the risks and agrees to proceed.   For CABG, BIMA tomorrow  Viviann Spare C. Dorris Fetch, MD [facility] [phone]

## 2023-03-22 NOTE — Consult Note (Signed)
PHARMACY - ANTICOAGULATION CONSULT NOTE  Pharmacy Consult for heparin Indication: chest pain/ACS  No Known Allergies  Patient Measurements: Height: 5\' 6"  (167.6 cm) Weight: 76.7 kg (169 lb) IBW/kg (Calculated) : 63.8 Heparin Dosing Weight: 76.7 kg  Vital Signs: Temp: 98.3 F (36.8 C) (12/10 0506) BP: 135/54 (12/10 0506) Pulse Rate: 63 (12/10 0506)  Labs: Recent Labs    03/21/23 1533 03/21/23 2134 03/22/23 0324  HGB  --   --  11.6*  HCT  --   --  33.0*  PLT  --   --  235  APTT 40*  --   --   LABPROT 14.0  --   --   INR 1.1  --   --   HEPARINUNFRC  --  0.27* 0.58    Estimated Creatinine Clearance: 50.5 mL/min (A) (by C-G formula based on SCr of 1.5 mg/dL (H)).   Medical History: Past Medical History:  Diagnosis Date   Arthritis    Carotid artery disease (HCC)    seen by [facility]   Coronary artery disease    Hx of transient ischemic attack (TIA)    Hyperlipidemia    Hypertension    Thyroid disease    hypothyrodism    Medications:  No history of chronic anticoagulant use PTA  Assessment: 62 y.o. male with PMH hypertension, hyperlipidemia, PAD presenting after outpatient coronary CT showed severe CAD including left main stenosis and reporting chest pain in office visit. Cath performed to evaluate intervention needed 12/9. Determined to have severe left main and RCA disease which was recommended to be addressed with CABG. Cardiology arranging follow up care with CVTS who plans tentatively to accept patient for CABG 12/11. Patient and family were updated and agreeable to this plan. Patient is currently chest pain free and awaiting transfer. Medications guided by cardiology. Pharmacy has been consulted to initiate continuous heparin while awaiting transfer. Baseline labs have been ordered and are pending.  Goal of Therapy:  Heparin level 0.3-0.7 units/ml Monitor platelets by anticoagulation protocol: Yes   Plan: heparin level therapeutic x 1 -  continue heparin infusion rate at 1100 units/hr - recheck anti-Xa level in 6 hours to confirm and at least once daily while on heparin - continue to monitor H&H and platelets  Otelia Sergeant, PharmD, [facility] 03/22/2023 6:35 AM

## 2023-03-22 NOTE — TOC CM/SW Note (Signed)
Transition of Care [facility]) - Inpatient Brief Assessment   Patient Details  Name: [name] MRN: [id number] Date of Birth: 18-Dec-1960  Transition of Care [facility]) CM/SW Contact:    Margarito Liner, LCSW Phone Number: 03/22/2023, 9:27 AM   Clinical Narrative: CSW reviewed chart. No TOC needs identified at this time. CSW will continue to follow progress. Please place [facility] consult if any needs arise. Patient has orders to transfer to [facility].  Transition of Care Asessment: Insurance and Status: Insurance coverage has been reviewed Patient has primary care physician: Yes Home environment has been reviewed: Single family home Prior level of function:: Not documented Prior/Current Home Services: No current home services Social Determinants of Health Reivew: SDOH reviewed no interventions necessary Readmission risk has been reviewed: Yes Transition of care needs: no transition of care needs at this time

## 2023-03-22 NOTE — Progress Notes (Signed)
Patient Name: [name] Date of Encounter: 03/22/2023 [facility] Cardiologist: Debbe Odea, MD   Interval Summary  .    Patient seen on a.m. rounds.  Denies any chest pain or shortness of breath.  Family remains at the bedside.  Left heart catheterization completed yesterday.  Patient continues to await transfer to [facility].  Vital Signs .    Vitals:   03/21/23 1410 03/21/23 2057 03/22/23 0010 03/22/23 0506  BP: (!) 157/59 (!) 146/55 (!) 136/51 (!) 135/54  Pulse: 76 72 62 63  Resp: 16 18 18 18   Temp: 98.8 F (37.1 C) 98 F (36.7 C) 98 F (36.7 C) 98.3 F (36.8 C)  TempSrc: Oral     SpO2: 97% 97% 94% 96%  Weight:      Height:        Intake/Output Summary (Last 24 hours) at 03/22/2023 0759 Last data filed at 03/21/2023 1744 Gross per 24 hour  Intake 44.87 ml  Output --  Net 44.87 ml      03/21/2023    8:10 AM 03/18/2023    3:56 PM 03/18/2023    3:01 PM  Last 3 Weights  Weight (lbs) 169 lb 173 lb 177 lb  Weight (kg) 76.658 kg 78.472 kg 80.287 kg      Telemetry/ECG    Sinus rhythm 60s and 70s- Personally Reviewed  Physical Exam .   GEN: No acute distress.   Neck: No JVD Cardiac: RRR, no murmurs, rubs, or gallops.  Right radial cath site with 2+ radial pulse, OpSite and gauze dressing clean, dry, and intact without any noted bleeding or hematoma Respiratory: Clear to auscultation bilaterally. GI: Soft, nontender, non-distended  MS: No edema  Assessment & Plan .     Coronary artery disease with previous episodes of unstable angina -Recent cardiac CTA for unstable angina show coronary calcium score of 11,275 with severe proximal left main stenosis with moderate proximal left circumflex/OM1 stenosis, mild RCA stenosis -Evaluated in the emergency department with negative troponins -EKG was nonacute -Echocardiogram completed 03/17/2023 showed an LVEF of 60 to 65%, G1 DD, normal RV SF, mild MR, mild to moderate TR, moderate AI -He  is continued on aspirin 81 mg daily, atorvastatin, and no beta-blocker given baseline bradycardia -Left heart catheterization completed 03/21/2023 revealed severe ostial left main 70% stenosis with significant catheter dampening, otherwise -50% mid LAD lesion with good distal target, LAD large diagonal branch which is also a good target as well as main LAD, heavily calcified severe ostial RCA 70-80% stenosis with proximal 50% stenosis, normal LVEDP with normal EF by recent echocardiogram -Recommendations patient to be transferred to [facility] for CVTS consultation, awaiting on bed -Continued on IV heparin -Patient currently remains chest pain-free -Continue on telemetry monitoring -EKG as needed for pain or changes  Hypertension -Blood pressure 141/57 -He is continued on amlodipine 5 mg daily -PTA lisinopril and HCTZ remain on hold -BMP ordered for today -Can look at restarting medications once kidney function is evaluated by a.m. labs -Vital signs per unit protocol  Hyperlipidemia -LDL 38 -Continued on atorvastatin  Carotid artery stenosis -Status post stent placement to the right ICA July 2022 and left stent CCA September 2022 -Carotid duplex in October 2024 showed bilateral ICA 40-59% nonhemodynamically significant plaque less than 50% in the bilateral CCA -Continued on aspirin and statin therapies  5.   Tobacco abuse -Continued on NicoDerm CQ patch -Total cessation is recommended  6.    Hypothyroidism -Continued on  levothyroxine 125 mcg daily For questions or updates, please contact [facility] Please consult [email or web address] for contact info under        Signed, Nylan Nakatani, NP

## 2023-03-22 NOTE — Consult Note (Signed)
PHARMACY - ANTICOAGULATION CONSULT NOTE  Pharmacy Consult for heparin Indication: chest pain/ACS  No Known Allergies  Patient Measurements: Height: 5\' 6"  (167.6 cm) Weight: 76.7 kg (169 lb) IBW/kg (Calculated) : 63.8 Heparin Dosing Weight: 76.7 kg  Vital Signs: Temp: 98.2 F (36.8 C) (12/10 0955) BP: 141/57 (12/10 0955) Pulse Rate: 74 (12/10 0955)  Labs: Recent Labs    03/21/23 1533 03/21/23 2134 03/22/23 0324 03/22/23 0959  HGB  --   --  11.6*  --   HCT  --   --  33.0*  --   PLT  --   --  235  --   APTT 40*  --   --   --   LABPROT 14.0  --   --   --   INR 1.1  --   --   --   HEPARINUNFRC  --  0.27* 0.58 0.44   Estimated Creatinine Clearance: 50.5 mL/min (A) (by C-G formula based on SCr of 1.5 mg/dL (H)).  Medical History: Past Medical History:  Diagnosis Date   Arthritis    Carotid artery disease (HCC)    seen by [facility]   Coronary artery disease    Hx of transient ischemic attack (TIA)    Hyperlipidemia    Hypertension    Thyroid disease    hypothyrodism   Medications:  No history of chronic anticoagulant use PTA  Assessment: 62 y.o. male with PMH hypertension, hyperlipidemia, PAD presenting after outpatient coronary CT showed severe CAD including left main stenosis and reporting chest pain in office visit. Cath performed to evaluate intervention needed 12/9. Determined to have severe left main and RCA disease which was recommended to be addressed with CABG. Cardiology arranging follow up care with CVTS who plans tentatively to accept patient for CABG 12/11. Patient and family were updated and agreeable to this plan. Patient is currently chest pain free and awaiting transfer. Medications guided by cardiology. Pharmacy has been consulted to initiate continuous heparin while awaiting transfer.   Goal of Therapy:  Heparin level 0.3-0.7 units/ml Monitor platelets by anticoagulation protocol: Yes    Date/time Results Comment 12/10@0324  HL=0.58 Therapeutic x 1 @ 1100 units/hr, waiting [facility] transfer 12/10@0959  HL=0.44 Therapeutic x 2  Plan:  - continue heparin infusion rate at 1100 units/hr - recheck HL and CBC with AM labs   Mariah Gerstenberger Rodriguez-Guzman PharmD, BCPS 03/22/2023 11:20 AM

## 2023-03-22 NOTE — H&P (View-Only) (Signed)
[address]       [place] [address]             [phone]        [name] [facility] Medical Record #[id number] Date of Birth: Dec 06, 1960  Referring: James Lex, MD Primary Care: James Junes, FNP (Inactive) Primary Cardiologist:James Azucena Cecil, MD  Chief Complaint:   Angina   History of Present Illness:    The patient is a 62 year old male we are asked to see in CT surgical consultation for consideration of coronary artery bypass grafting.  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  He underwent coronary CTA in December which showed a significantly elevated coronary calcium score of 1175 which was 97 percentile.  It showed evidence of severe proximal left main stenosis as well as circumflex and obtuse marginal 1 disease.  There was mild RCA stenosis.  FFR was significant for the ostial left main and RCA lesions.  He describes chest pain with exertion for the past approximate 2 months.  Primarily it is midsternal but there is some radiation to neck and left shoulder.  He denies palpitations or lower extremity edema.  He denies orthopnea.  He does get shortness of breath with these events and they have been worsening over time.  Symptoms typically resolve after about 5 minutes of resting.  He works in [employer] and his job is physically taxing.  On an office visit on 03/18/2023 he reported symptoms consistent with unstable angina and was sent to the emergency room.  He was admitted for further evaluation and treatment to include cardiac catheterization.  Cardiac catheterization done 03/21/2023 confirmed the ostial left main stenosis at 70% with significant catheter dampening.  There was also a mid 50-60% LAD lesion.  He was found to have severe ostial RCA disease in the 70 to 80% range.  He has a normal  LVEDP.  Echocardiogram shows normal LV function with no significant valvular disease.  The report is as described below.  He was felt best suited in terms of coronary revascularization to undergo CABG and was transferred to [facility] for surgical evaluation.    Current Activity/ Functional Status: Patient is independent with mobility/ambulation, transfers, ADL's, IADL's.   Zubrod Score: At the time of surgery this patient's most appropriate activity status/level should be described as: []     0    Normal activity, no symptoms [x]     1    Restricted in physical strenuous activity but ambulatory, able to do out light work []     2    Ambulatory and capable of self care, unable to do work activities, up and about                 more than 50%  Of the time                            []     3    Only limited self care, in bed greater than 50% of waking hours []     4    Completely disabled, no self care, confined to bed or chair []     5    Moribund  Past Medical History:  Diagnosis Date   Arthritis    Carotid artery disease (HCC)    seen by [facility]  Coronary artery disease    Hx of transient ischemic attack (TIA)    Hyperlipidemia    Hypertension    Thyroid disease    hypothyrodism    Past Surgical History:  Procedure Laterality Date   BACK SURGERY     CAROTID PTA/STENT INTERVENTION Right 10/22/2020   Procedure: CAROTID PTA/STENT INTERVENTION;  Surgeon: James Dills, MD;  Location: [facility] INVASIVE CV LAB;  Service: Cardiovascular;  Laterality: Right;   CAROTID PTA/STENT INTERVENTION Left 12/17/2020   Procedure: CAROTID PTA/STENT INTERVENTION;  Surgeon: James Dills, MD;  Location: [facility] INVASIVE CV LAB;  Service: Cardiovascular;  Laterality: Left;   CATARACT EXTRACTION W/PHACO Right 08/18/2022   Procedure: CATARACT EXTRACTION PHACO AND INTRAOCULAR LENS PLACEMENT (IOC) RIGHT MALYUGIN 4.19 00:24.5;  Surgeon: James Mola, MD;  Location: [facility];  Service: Ophthalmology;  Laterality: Right;   LEFT HEART CATH AND CORONARY ANGIOGRAPHY N/A 03/21/2023   Procedure: LEFT HEART CATH AND CORONARY ANGIOGRAPHY;  Surgeon: James Lex, MD;  Location: [facility] INVASIVE CV LAB;  Service: Cardiovascular;  Laterality: N/A;    Social History   Tobacco Use  Smoking Status Every Day   Current packs/day: 1.00   Average packs/day: 1 pack/day for 47.0 years (47.0 ttl pk-yrs)   Types: Cigarettes  Smokeless Tobacco Never    Social History   Substance and Sexual Activity  Alcohol Use Never     No Known Allergies  Current Facility-Administered Medications  Medication Dose Route Frequency Provider Last Rate Last Admin   amLODipine (NORVASC) tablet 5 mg  5 mg Oral Daily James Page B, NP       aspirin EC tablet 81 mg  81 mg Oral Daily James Page B, NP       atorvastatin (LIPITOR) tablet 20 mg  20 mg Oral Daily James Page B, NP       heparin ADULT infusion 100 units/mL (25000 units/290mL)  1,100 Units/hr Intravenous Continuous [name], RPH 11 mL/hr at 03/22/23 1647 1,100 Units/hr at 03/22/23 1647   [START ON 03/23/2023] levothyroxine (SYNTHROID) tablet 125 mcg  125 mcg Oral Q0600 James Baumgartner, NP       nicotine (NICODERM CQ - dosed in mg/24 hours) patch 21 mg  21 mg Transdermal Daily James Page B, NP       pantoprazole (PROTONIX) EC tablet 40 mg  40 mg Oral Daily James Page B, NP        Medications Prior to Admission  Medication Sig Dispense Refill Last Dose   acetaminophen (TYLENOL) 500 MG tablet Take 1,000 mg by mouth every 6 (six) hours as needed for mild pain.      amLODipine (NORVASC) 5 MG tablet Take 5 mg by mouth daily.      aspirin EC 81 MG EC tablet Take 1 tablet (81 mg total) by mouth at bedtime. Swallow whole. 30 tablet 11    atorvastatin (LIPITOR) 40 MG tablet Take 40 mg by mouth at bedtime.      levothyroxine (SYNTHROID) 125 MCG tablet Take 1 tablet (125 mcg total) by mouth daily at  6 (six) AM.      metoprolol tartrate (LOPRESSOR) 100 MG tablet Take 1 tablet (100 mg total) by mouth once for 1 dose. TAKE TWO HOURS PRIOR TO CARDIAC CTA 1 tablet 0    nitroGLYCERIN (NITROSTAT) 0.4 MG SL tablet Place 1 tablet (0.4 mg total) under the tongue every 5 (five) minutes as needed for chest pain. 30 tablet 0     Family  History  Problem Relation Age of Onset   Hypothyroidism Mother    Diabetes Mother    Heart attack Mother    Cancer Father    Heart disease Sister    Hypothyroidism Sister    Heart disease Brother    Hypothyroidism Brother      Review of Systems:   Review of Systems  Constitutional:  Positive for malaise/fatigue.  HENT:  Positive for tinnitus.   Eyes:        Status post right eye cataract surgery  Respiratory:  Positive for shortness of breath.   Cardiovascular:  Positive for chest pain.  Gastrointestinal:  Positive for heartburn.  Musculoskeletal:  Positive for myalgias.  Neurological:  Positive for dizziness.        Physical Exam: BP (!) 172/66 (BP Location: Left Arm)   Pulse 88   Temp 99.1 F (37.3 C) (Oral)   Resp 18   Ht 5\' 6"  (1.676 m)   Wt 78 kg   SpO2 100%   BMI 27.75 kg/m    General appearance: alert, cooperative, and no distress Head: Normocephalic, without obvious abnormality, atraumatic Neck: no adenopathy, no JVD, supple, symmetrical, trachea midline, thyroid not enlarged, symmetric, no tenderness/mass/nodules, and bilateral carotid bruits Lymph nodes: Cervical, supraclavicular, and axillary nodes normal. Resp: clear to auscultation bilaterally Back: symmetric, no curvature. ROM normal. No CVA tenderness. Cardio: regular rate and rhythm, S1, S2 normal, no murmur, click, rub or gallop GI: soft, non-tender; bowel sounds normal; no masses,  no organomegaly Extremities: extremities normal, atraumatic, no cyanosis or edema and palpable pulses Neurologic: Grossly normal  Diagnostic Studies & Laboratory data:     Recent  Radiology Findings:   CARDIAC CATHETERIZATION  Result Date: 03/21/2023   Ost RCA lesion is 70% stenosed.   Prox RCA lesion is 50% stenosed.   Ost LM lesion is 75% stenosed.   1st Mrg lesion is 50% stenosed.   LV end diastolic pressure is normal.   There is no aortic valve stenosis. POST-OPERATIVE DIAGNOSIS:  Severe ostial left main 70% stenosis with significant catheter dampening.  Otherwise modest to 50% mid OM lesion with good distal target.  LAD has a large diagonal branch which is also a good target as well as the main LAD.  No significant disease. Heavily calcified severe ostial RCA 70 to 80% stenosis with proximal 50% stenosis. Normal LVEDP with normal EF by recent Echo RECOMMENDATIONS   The patient be transferred to [facility] for CVTS consultation Will initiate IV heparin patient with ACS presentation Continue aggressive GDMT CV Risk Factor Modification   Recommend Aspirin 81mg  daily for moderate CAD.   Could consider adding clopidogrel prior to discharge post CABG         ECHOCARDIOGRAM REPORT       Patient Name:   [name] Date of Exam: 03/17/2023  Medical Rec #:  [id number]         Height:       66.0 in  Accession #:    [id number]        Weight:       175.6 lb  Date of Birth:  [birth year]         BSA:          1.892 m  Patient Age:    62 years          BP:           148/62 mmHg  Patient Gender: M  HR:           63 bpm.  Exam Location:  [facility]   Procedure: 2D Echo, 3D Echo, Cardiac Doppler, Color Doppler and Strain  Analysis   Indications:   R07.9* Chest pain, unspecified    History:        Patient has no prior history of Echocardiogram  examinations.                 CAD, TIA, Signs/Symptoms:Chest Pain; Risk  Factors:Hypertension                 and Dyslipidemia.    Sonographer:    Ilda Mori MHA, BS, RDCS  Referring Phys: [id number] James AGBOR-ETANG   IMPRESSIONS     1. Left ventricular ejection fraction, by estimation, is 60 to 65%.  The  left ventricle has normal function. The left ventricle has no regional  wall motion abnormalities. Left ventricular diastolic parameters are  consistent with Grade I diastolic  dysfunction (impaired relaxation). The average left ventricular global  longitudinal strain is -21.8 %.   2. Right ventricular systolic function is normal. The right ventricular  size is normal. There is normal pulmonary artery systolic pressure. The  estimated right ventricular systolic pressure is 32.9 mmHg.   3. Left atrial size was mild to moderately dilated.   4. The mitral valve is normal in structure. Mild mitral valve  regurgitation. No evidence of mitral stenosis.   5. Tricuspid valve regurgitation is mild to moderate.   6. The aortic valve is tricuspid. There is mild calcification of the  aortic valve. Aortic valve regurgitation is moderate. No aortic stenosis  is present.   7. The inferior vena cava is normal in size with greater than 50%  respiratory variability, suggesting right atrial pressure of 3 mmHg.   FINDINGS   Left Ventricle: Left ventricular ejection fraction, by estimation, is 60  to 65%. The left ventricle has normal function. The left ventricle has no  regional wall motion abnormalities. The average left ventricular global  longitudinal strain is -21.8 %.  The left ventricular internal cavity size was normal in size. There is no  left ventricular hypertrophy. Left ventricular diastolic parameters are  consistent with Grade I diastolic dysfunction (impaired relaxation).   Right Ventricle: The right ventricular size is normal. No increase in  right ventricular wall thickness. Right ventricular systolic function is  normal. There is normal pulmonary artery systolic pressure. The tricuspid  regurgitant velocity is 2.64 m/s, and   with an assumed right atrial pressure of 5 mmHg, the estimated right  ventricular systolic pressure is 32.9 mmHg.   Left Atrium: Left atrial size was mild  to moderately dilated.   Right Atrium: Right atrial size was normal in size.   Pericardium: There is no evidence of pericardial effusion.   Mitral Valve: The mitral valve is normal in structure. Mild mitral valve  regurgitation. No evidence of mitral valve stenosis.   Tricuspid Valve: The tricuspid valve is normal in structure. Tricuspid  valve regurgitation is mild to moderate. No evidence of tricuspid  stenosis.   Aortic Valve: The aortic valve is tricuspid. There is mild calcification  of the aortic valve. Aortic valve regurgitation is moderate. Aortic  regurgitation PHT measures 425 msec. No aortic stenosis is present.   Pulmonic Valve: The pulmonic valve was normal in structure. Pulmonic valve  regurgitation is mild. No evidence of pulmonic stenosis.   Aorta: The aortic root is normal in size and structure.  Venous: The inferior vena cava is normal in size with greater than 50%  respiratory variability, suggesting right atrial pressure of 3 mmHg.   IAS/Shunts: No atrial level shunt detected by color flow Doppler.     LEFT VENTRICLE  PLAX 2D  LVIDd:         5.00 cm Diastology  LVIDs:         3.00 cm LV e' medial:    8.27 cm/s  LV PW:         1.00 cm LV E/e' medial:  12.3  LV IVS:        1.00 cm LV e' lateral:   9.90 cm/s                         LV E/e' lateral: 10.3                           2D Longitudinal Strain                         2D Strain GLS Avg:     -21.8 %                           3D Volume EF:                         3D EF:        63 %                         LV EDV:       163 ml                         LV ESV:       60 ml                         LV SV:        102 ml   RIGHT VENTRICLE  RV Basal diam:  3.70 cm  RV Mid diam:    3.20 cm  RV S prime:     20.20 cm/s  TAPSE (M-mode): 1.7 cm   LEFT ATRIUM             Index        RIGHT ATRIUM           Index  LA diam:        4.30 cm 2.27 cm/m   RA Area:     20.80 cm  LA Vol (A2C):   87.0 ml 45.98  ml/m  RA Volume:   58.90 ml  31.13 ml/m  LA Vol (A4C):   61.1 ml 32.29 ml/m  LA Biplane Vol: 75.0 ml 39.64 ml/m   AORTIC VALVE  AI PHT:      425 msec    AORTA  Ao Root diam: 3.40 cm  Ao Asc diam:  3.10 cm   MITRAL VALVE                TRICUSPID VALVE  MV Area (PHT): 4.06 cm     TR Peak grad:   27.9 mmHg  MV Decel Time: 187 msec     TR Vmax:        264.00 cm/s  MV E  velocity: 102.00 cm/s  MV A velocity: 87.50 cm/s  MV E/A ratio:  1.17   Julien Nordmann MD  Electronically signed by Julien Nordmann MD  Signature Date/Time: 03/17/2023/4:04:49 PM        I have independently reviewed the above radiologic studies and discussed with the patient   Recent Lab Findings: Lab Results  Component Value Date   WBC 9.1 03/22/2023   HGB 11.6 (L) 03/22/2023   HCT 33.0 (L) 03/22/2023   PLT 235 03/22/2023   GLUCOSE 113 (H) 03/22/2023   CHOL 93 03/18/2023   TRIG 83 03/18/2023   HDL 38 (L) 03/18/2023   LDLCALC 38 03/18/2023   NA 137 03/22/2023   K 4.1 03/22/2023   CL 109 03/22/2023   CREATININE 1.40 (H) 03/22/2023   BUN 26 (H) 03/22/2023   CO2 20 (L) 03/22/2023   TSH 0.249 (L) 03/18/2023   INR 1.1 03/21/2023   HGBA1C 5.5 03/18/2023      Assessment / Plan: Severe left main and two-vessel coronary artery disease with unstable angina. 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 History of TIA Hyperlipidemia Hypertension Hypothyroidism Tobacco abuse Arthritis GERD   Plan: The patient appears to be a candidate to proceed with CABG. he will have routine preop testing.  Plan at current time is for possible bilateral mammary grafts.  The surgeon will evaluate the patient and relevant studies and make final determinations.    I  spent 30 minutes counseling the patient face to face.   Rowe Clack, PA-C  03/22/2023 5:40 PM  Patient seen and examined, agree with above For CABG this AM All questions answered  Viviann Spare C. Dorris Fetch,  MD [facility] [phone]

## 2023-03-22 NOTE — Plan of Care (Signed)
  Problem: Education: Goal: Knowledge of General Education information will improve Description: Including pain rating scale, medication(s)/side effects and non-pharmacologic comfort measures Outcome: Progressing   Problem: Health Behavior/Discharge Planning: Goal: Ability to manage health-related needs will improve Outcome: Progressing   Problem: Clinical Measurements: Goal: Will remain free from infection Outcome: Progressing   

## 2023-03-23 ENCOUNTER — Encounter (HOSPITAL_COMMUNITY): Payer: Self-pay | Admitting: Cardiology

## 2023-03-23 ENCOUNTER — Inpatient Hospital Stay (HOSPITAL_COMMUNITY): Payer: Commercial Managed Care - PPO

## 2023-03-23 ENCOUNTER — Inpatient Hospital Stay (HOSPITAL_COMMUNITY)
Admission: RE | Admit: 2023-03-23 | Payer: Commercial Managed Care - PPO | Source: Home / Self Care | Admitting: Thoracic Surgery (Cardiothoracic Vascular Surgery)

## 2023-03-23 ENCOUNTER — Other Ambulatory Visit: Payer: Self-pay

## 2023-03-23 ENCOUNTER — Inpatient Hospital Stay (HOSPITAL_COMMUNITY)
Admission: AD | Disposition: A | Discharge status: Home / Self Care | Payer: Self-pay | Source: Other Acute Inpatient Hospital | Attending: Cardiology

## 2023-03-23 DIAGNOSIS — F17210 Nicotine dependence, cigarettes, uncomplicated: Secondary | ICD-10-CM | POA: Diagnosis not present

## 2023-03-23 DIAGNOSIS — I2510 Atherosclerotic heart disease of native coronary artery without angina pectoris: Secondary | ICD-10-CM

## 2023-03-23 DIAGNOSIS — Z951 Presence of aortocoronary bypass graft: Secondary | ICD-10-CM

## 2023-03-23 DIAGNOSIS — I25110 Atherosclerotic heart disease of native coronary artery with unstable angina pectoris: Secondary | ICD-10-CM | POA: Diagnosis not present

## 2023-03-23 DIAGNOSIS — I200 Unstable angina: Secondary | ICD-10-CM

## 2023-03-23 HISTORY — PX: TEE WITHOUT CARDIOVERSION: SHX5443

## 2023-03-23 HISTORY — PX: CORONARY ARTERY BYPASS GRAFT: SHX141

## 2023-03-23 LAB — POCT I-STAT 7, (LYTES, BLD GAS, ICA,H+H)
Acid-base deficit: 6 mmol/L — ABNORMAL HIGH (ref 0.0–2.0)
Acid-base deficit: 7 mmol/L — ABNORMAL HIGH (ref 0.0–2.0)
Acid-base deficit: 3 mmol/L — ABNORMAL HIGH (ref 0.0–2.0)
Acid-base deficit: 4 mmol/L — ABNORMAL HIGH (ref 0.0–2.0)
Acid-base deficit: 6 mmol/L — ABNORMAL HIGH (ref 0.0–2.0)
Acid-base deficit: 7 mmol/L — ABNORMAL HIGH (ref 0.0–2.0)
Acid-base deficit: 2 mmol/L (ref 0.0–2.0)
Acid-base deficit: 6 mmol/L — ABNORMAL HIGH (ref 0.0–2.0)
Bicarbonate: 19.1 mmol/L — ABNORMAL LOW (ref 20.0–28.0)
Bicarbonate: 18.7 mmol/L — ABNORMAL LOW (ref 20.0–28.0)
Bicarbonate: 22.1 mmol/L (ref 20.0–28.0)
Bicarbonate: 19.9 mmol/L — ABNORMAL LOW (ref 20.0–28.0)
Bicarbonate: 21 mmol/L (ref 20.0–28.0)
Bicarbonate: 19.7 mmol/L — ABNORMAL LOW (ref 20.0–28.0)
Bicarbonate: 24.2 mmol/L (ref 20.0–28.0)
Bicarbonate: 20.1 mmol/L (ref 20.0–28.0)
Calcium, Ion: 1.28 mmol/L (ref 1.15–1.40)
Calcium, Ion: 1.02 mmol/L — ABNORMAL LOW (ref 1.15–1.40)
Calcium, Ion: 1.08 mmol/L — ABNORMAL LOW (ref 1.15–1.40)
Calcium, Ion: 1.09 mmol/L — ABNORMAL LOW (ref 1.15–1.40)
Calcium, Ion: 1.1 mmol/L — ABNORMAL LOW (ref 1.15–1.40)
Calcium, Ion: 1.12 mmol/L — ABNORMAL LOW (ref 1.15–1.40)
Calcium, Ion: 1.1 mmol/L — ABNORMAL LOW (ref 1.15–1.40)
Calcium, Ion: 1.14 mmol/L — ABNORMAL LOW (ref 1.15–1.40)
HCT: 31 % — ABNORMAL LOW (ref 39.0–52.0)
HCT: 21 % — ABNORMAL LOW (ref 39.0–52.0)
HCT: 20 % — ABNORMAL LOW (ref 39.0–52.0)
HCT: 20 % — ABNORMAL LOW (ref 39.0–52.0)
HCT: 22 % — ABNORMAL LOW (ref 39.0–52.0)
HCT: 22 % — ABNORMAL LOW (ref 39.0–52.0)
HCT: 21 % — ABNORMAL LOW (ref 39.0–52.0)
HCT: 22 % — ABNORMAL LOW (ref 39.0–52.0)
Hemoglobin: 10.5 g/dL — ABNORMAL LOW (ref 13.0–17.0)
Hemoglobin: 7.1 g/dL — ABNORMAL LOW (ref 13.0–17.0)
Hemoglobin: 6.8 g/dL — CL (ref 13.0–17.0)
Hemoglobin: 6.8 g/dL — CL (ref 13.0–17.0)
Hemoglobin: 7.5 g/dL — ABNORMAL LOW (ref 13.0–17.0)
Hemoglobin: 7.5 g/dL — ABNORMAL LOW (ref 13.0–17.0)
Hemoglobin: 7.1 g/dL — ABNORMAL LOW (ref 13.0–17.0)
Hemoglobin: 7.5 g/dL — ABNORMAL LOW (ref 13.0–17.0)
O2 Saturation: 100 %
O2 Saturation: 100 %
O2 Saturation: 100 %
O2 Saturation: 100 %
O2 Saturation: 96 %
O2 Saturation: 91 %
O2 Saturation: 99 %
O2 Saturation: 87 %
Patient temperature: 36.5
Patient temperature: 36.2
Patient temperature: 36.3
Potassium: 3.9 mmol/L (ref 3.5–5.1)
Potassium: 5.6 mmol/L — ABNORMAL HIGH (ref 3.5–5.1)
Potassium: 5.9 mmol/L — ABNORMAL HIGH (ref 3.5–5.1)
Potassium: 5.5 mmol/L — ABNORMAL HIGH (ref 3.5–5.1)
Potassium: 6.4 mmol/L (ref 3.5–5.1)
Potassium: 5.2 mmol/L — ABNORMAL HIGH (ref 3.5–5.1)
Potassium: 5.7 mmol/L — ABNORMAL HIGH (ref 3.5–5.1)
Potassium: 4.5 mmol/L (ref 3.5–5.1)
Sodium: 144 mmol/L (ref 135–145)
Sodium: 140 mmol/L (ref 135–145)
Sodium: 141 mmol/L (ref 135–145)
Sodium: 138 mmol/L (ref 135–145)
Sodium: 142 mmol/L (ref 135–145)
Sodium: 143 mmol/L (ref 135–145)
Sodium: 145 mmol/L (ref 135–145)
Sodium: 142 mmol/L (ref 135–145)
TCO2: 20 mmol/L — ABNORMAL LOW (ref 22–32)
TCO2: 20 mmol/L — ABNORMAL LOW (ref 22–32)
TCO2: 23 mmol/L (ref 22–32)
TCO2: 21 mmol/L — ABNORMAL LOW (ref 22–32)
TCO2: 22 mmol/L (ref 22–32)
TCO2: 21 mmol/L — ABNORMAL LOW (ref 22–32)
TCO2: 26 mmol/L (ref 22–32)
TCO2: 21 mmol/L — ABNORMAL LOW (ref 22–32)
pCO2 arterial: 37.1 mm[Hg] (ref 32–48)
pCO2 arterial: 36 mm[Hg] (ref 32–48)
pCO2 arterial: 36.5 mm[Hg] (ref 32–48)
pCO2 arterial: 37.6 mm[Hg] (ref 32–48)
pCO2 arterial: 42 mm[Hg] (ref 32–48)
pCO2 arterial: 41 mm[Hg] (ref 32–48)
pCO2 arterial: 46.9 mm[Hg] (ref 32–48)
pCO2 arterial: 38.6 mm[Hg] (ref 32–48)
pH, Arterial: 7.319 — ABNORMAL LOW (ref 7.35–7.45)
pH, Arterial: 7.323 — ABNORMAL LOW (ref 7.35–7.45)
pH, Arterial: 7.391 (ref 7.35–7.45)
pH, Arterial: 7.284 — ABNORMAL LOW (ref 7.35–7.45)
pH, Arterial: 7.354 (ref 7.35–7.45)
pH, Arterial: 7.288 — ABNORMAL LOW (ref 7.35–7.45)
pH, Arterial: 7.316 — ABNORMAL LOW (ref 7.35–7.45)
pH, Arterial: 7.32 — ABNORMAL LOW (ref 7.35–7.45)
pO2, Arterial: 276 mm[Hg] — ABNORMAL HIGH (ref 83–108)
pO2, Arterial: 392 mm[Hg] — ABNORMAL HIGH (ref 83–108)
pO2, Arterial: 326 mm[Hg] — ABNORMAL HIGH (ref 83–108)
pO2, Arterial: 334 mm[Hg] — ABNORMAL HIGH (ref 83–108)
pO2, Arterial: 94 mm[Hg] (ref 83–108)
pO2, Arterial: 67 mm[Hg] — ABNORMAL LOW (ref 83–108)
pO2, Arterial: 140 mm[Hg] — ABNORMAL HIGH (ref 83–108)
pO2, Arterial: 55 mm[Hg] — ABNORMAL LOW (ref 83–108)

## 2023-03-23 LAB — POCT I-STAT, CHEM 8
BUN: 22 mg/dL (ref 8–23)
BUN: 21 mg/dL (ref 8–23)
BUN: 21 mg/dL (ref 8–23)
BUN: 22 mg/dL (ref 8–23)
BUN: 22 mg/dL (ref 8–23)
Calcium, Ion: 1.29 mmol/L (ref 1.15–1.40)
Calcium, Ion: 1.24 mmol/L (ref 1.15–1.40)
Calcium, Ion: 1.11 mmol/L — ABNORMAL LOW (ref 1.15–1.40)
Calcium, Ion: 1.09 mmol/L — ABNORMAL LOW (ref 1.15–1.40)
Calcium, Ion: 1.09 mmol/L — ABNORMAL LOW (ref 1.15–1.40)
Chloride: 111 mmol/L (ref 98–111)
Chloride: 112 mmol/L — ABNORMAL HIGH (ref 98–111)
Chloride: 110 mmol/L (ref 98–111)
Chloride: 111 mmol/L (ref 98–111)
Chloride: 111 mmol/L (ref 98–111)
Creatinine, Ser: 1.3 mg/dL — ABNORMAL HIGH (ref 0.61–1.24)
Creatinine, Ser: 1.3 mg/dL — ABNORMAL HIGH (ref 0.61–1.24)
Creatinine, Ser: 1.4 mg/dL — ABNORMAL HIGH (ref 0.61–1.24)
Creatinine, Ser: 1.3 mg/dL — ABNORMAL HIGH (ref 0.61–1.24)
Creatinine, Ser: 1.3 mg/dL — ABNORMAL HIGH (ref 0.61–1.24)
Glucose, Bld: 96 mg/dL (ref 70–99)
Glucose, Bld: 90 mg/dL (ref 70–99)
Glucose, Bld: 127 mg/dL — ABNORMAL HIGH (ref 70–99)
Glucose, Bld: 144 mg/dL — ABNORMAL HIGH (ref 70–99)
Glucose, Bld: 120 mg/dL — ABNORMAL HIGH (ref 70–99)
HCT: 32 % — ABNORMAL LOW (ref 39.0–52.0)
HCT: 28 % — ABNORMAL LOW (ref 39.0–52.0)
HCT: 23 % — ABNORMAL LOW (ref 39.0–52.0)
HCT: 21 % — ABNORMAL LOW (ref 39.0–52.0)
HCT: 24 % — ABNORMAL LOW (ref 39.0–52.0)
Hemoglobin: 10.9 g/dL — ABNORMAL LOW (ref 13.0–17.0)
Hemoglobin: 9.5 g/dL — ABNORMAL LOW (ref 13.0–17.0)
Hemoglobin: 7.8 g/dL — ABNORMAL LOW (ref 13.0–17.0)
Hemoglobin: 7.1 g/dL — ABNORMAL LOW (ref 13.0–17.0)
Hemoglobin: 8.2 g/dL — ABNORMAL LOW (ref 13.0–17.0)
Potassium: 4.1 mmol/L (ref 3.5–5.1)
Potassium: 4.2 mmol/L (ref 3.5–5.1)
Potassium: 5.7 mmol/L — ABNORMAL HIGH (ref 3.5–5.1)
Potassium: 6.4 mmol/L (ref 3.5–5.1)
Potassium: 5.5 mmol/L — ABNORMAL HIGH (ref 3.5–5.1)
Sodium: 144 mmol/L (ref 135–145)
Sodium: 144 mmol/L (ref 135–145)
Sodium: 141 mmol/L (ref 135–145)
Sodium: 139 mmol/L (ref 135–145)
Sodium: 141 mmol/L (ref 135–145)
TCO2: 21 mmol/L — ABNORMAL LOW (ref 22–32)
TCO2: 19 mmol/L — ABNORMAL LOW (ref 22–32)
TCO2: 24 mmol/L (ref 22–32)
TCO2: 25 mmol/L (ref 22–32)
TCO2: 21 mmol/L — ABNORMAL LOW (ref 22–32)

## 2023-03-23 LAB — GLUCOSE, CAPILLARY
Glucose-Capillary: 115 mg/dL — ABNORMAL HIGH (ref 70–99)
Glucose-Capillary: 119 mg/dL — ABNORMAL HIGH (ref 70–99)
Glucose-Capillary: 121 mg/dL — ABNORMAL HIGH (ref 70–99)
Glucose-Capillary: 167 mg/dL — ABNORMAL HIGH (ref 70–99)
Glucose-Capillary: 148 mg/dL — ABNORMAL HIGH (ref 70–99)
Glucose-Capillary: 147 mg/dL — ABNORMAL HIGH (ref 70–99)

## 2023-03-23 LAB — BASIC METABOLIC PANEL
Anion gap: 5 (ref 5–15)
Anion gap: 7 (ref 5–15)
Anion gap: 9 (ref 5–15)
BUN: 23 mg/dL (ref 8–23)
BUN: 24 mg/dL — ABNORMAL HIGH (ref 8–23)
BUN: 25 mg/dL — ABNORMAL HIGH (ref 8–23)
CO2: 19 mmol/L — ABNORMAL LOW (ref 22–32)
CO2: 21 mmol/L — ABNORMAL LOW (ref 22–32)
CO2: 22 mmol/L (ref 22–32)
Calcium: 7.7 mg/dL — ABNORMAL LOW (ref 8.9–10.3)
Calcium: 9 mg/dL (ref 8.9–10.3)
Calcium: 9.1 mg/dL (ref 8.9–10.3)
Chloride: 111 mmol/L (ref 98–111)
Chloride: 113 mmol/L — ABNORMAL HIGH (ref 98–111)
Chloride: 113 mmol/L — ABNORMAL HIGH (ref 98–111)
Creatinine, Ser: 1.44 mg/dL — ABNORMAL HIGH (ref 0.61–1.24)
Creatinine, Ser: 1.5 mg/dL — ABNORMAL HIGH (ref 0.61–1.24)
Creatinine, Ser: 1.53 mg/dL — ABNORMAL HIGH (ref 0.61–1.24)
GFR, Estimated: 51 mL/min — ABNORMAL LOW (ref >60)
GFR, Estimated: 53 mL/min — ABNORMAL LOW (ref >60)
GFR, Estimated: 55 mL/min — ABNORMAL LOW (ref >60)
Glucose, Bld: 155 mg/dL — ABNORMAL HIGH (ref 70–99)
Glucose, Bld: 90 mg/dL (ref 70–99)
Glucose, Bld: 92 mg/dL (ref 70–99)
Potassium: 3.9 mmol/L (ref 3.5–5.1)
Potassium: 4.1 mmol/L (ref 3.5–5.1)
Potassium: 4.4 mmol/L (ref 3.5–5.1)
Sodium: 139 mmol/L (ref 135–145)
Sodium: 140 mmol/L (ref 135–145)
Sodium: 141 mmol/L (ref 135–145)

## 2023-03-23 LAB — CBC WITH DIFFERENTIAL/PLATELET
Abs Immature Granulocytes: 0.03 10*3/uL (ref 0.00–0.07)
Basophils Absolute: 0.1 10*3/uL (ref 0.0–0.1)
Basophils Relative: 1 %
Eosinophils Absolute: 0.8 10*3/uL — ABNORMAL HIGH (ref 0.0–0.5)
Eosinophils Relative: 9 %
HCT: 32.4 % — ABNORMAL LOW (ref 39.0–52.0)
Hemoglobin: 11.2 g/dL — ABNORMAL LOW (ref 13.0–17.0)
Immature Granulocytes: 0 %
Lymphocytes Relative: 32 %
Lymphs Abs: 2.8 10*3/uL (ref 0.7–4.0)
MCH: 32.3 pg (ref 26.0–34.0)
MCHC: 34.6 g/dL (ref 30.0–36.0)
MCV: 93.4 fL (ref 80.0–100.0)
Monocytes Absolute: 0.8 10*3/uL (ref 0.1–1.0)
Monocytes Relative: 9 %
Neutro Abs: 4.3 10*3/uL (ref 1.7–7.7)
Neutrophils Relative %: 49 %
Platelets: 231 10*3/uL (ref 150–400)
RBC: 3.47 MIL/uL — ABNORMAL LOW (ref 4.22–5.81)
RDW: 13.2 % (ref 11.5–15.5)
WBC: 8.7 10*3/uL (ref 4.0–10.5)
nRBC: 0 % (ref 0.0–0.2)

## 2023-03-23 LAB — CBC
HCT: 25.1 % — ABNORMAL LOW (ref 39.0–52.0)
HCT: 23.6 % — ABNORMAL LOW (ref 39.0–52.0)
Hemoglobin: 8.4 g/dL — ABNORMAL LOW (ref 13.0–17.0)
Hemoglobin: 8.1 g/dL — ABNORMAL LOW (ref 13.0–17.0)
MCH: 32.4 pg (ref 26.0–34.0)
MCH: 33.2 pg (ref 26.0–34.0)
MCHC: 33.5 g/dL (ref 30.0–36.0)
MCHC: 34.3 g/dL (ref 30.0–36.0)
MCV: 96.9 fL (ref 80.0–100.0)
MCV: 96.7 fL (ref 80.0–100.0)
Platelets: 125 10*3/uL — ABNORMAL LOW (ref 150–400)
Platelets: 137 10*3/uL — ABNORMAL LOW (ref 150–400)
RBC: 2.59 MIL/uL — ABNORMAL LOW (ref 4.22–5.81)
RBC: 2.44 MIL/uL — ABNORMAL LOW (ref 4.22–5.81)
RDW: 13.4 % (ref 11.5–15.5)
RDW: 13.4 % (ref 11.5–15.5)
WBC: 13.6 10*3/uL — ABNORMAL HIGH (ref 4.0–10.5)
WBC: 12 10*3/uL — ABNORMAL HIGH (ref 4.0–10.5)
nRBC: 0 % (ref 0.0–0.2)
nRBC: 0 % (ref 0.0–0.2)

## 2023-03-23 LAB — MAGNESIUM: Magnesium: 3.7 mg/dL — ABNORMAL HIGH (ref 1.7–2.4)

## 2023-03-23 LAB — PROTIME-INR
INR: 1.4 — ABNORMAL HIGH (ref 0.8–1.2)
Prothrombin Time: 17 s — ABNORMAL HIGH (ref 11.4–15.2)

## 2023-03-23 LAB — HEPARIN LEVEL (UNFRACTIONATED): Heparin Unfractionated: 0.25 [IU]/mL — ABNORMAL LOW (ref 0.30–0.70)

## 2023-03-23 LAB — VAS US DOPPLER PRE CABG
Left ABI: 0.87
Right ABI: 1

## 2023-03-23 LAB — POCT I-STAT EG7
Acid-base deficit: 7 mmol/L — ABNORMAL HIGH (ref 0.0–2.0)
Bicarbonate: 19.5 mmol/L — ABNORMAL LOW (ref 20.0–28.0)
Calcium, Ion: 1.09 mmol/L — ABNORMAL LOW (ref 1.15–1.40)
HCT: 24 % — ABNORMAL LOW (ref 39.0–52.0)
Hemoglobin: 8.2 g/dL — ABNORMAL LOW (ref 13.0–17.0)
O2 Saturation: 70 %
Potassium: 5.3 mmol/L — ABNORMAL HIGH (ref 3.5–5.1)
Sodium: 141 mmol/L (ref 135–145)
TCO2: 21 mmol/L — ABNORMAL LOW (ref 22–32)
pCO2, Ven: 40.2 mm[Hg] — ABNORMAL LOW (ref 44–60)
pH, Ven: 7.293 (ref 7.25–7.43)
pO2, Ven: 41 mm[Hg] (ref 32–45)

## 2023-03-23 LAB — APTT: aPTT: 32 s (ref 24–36)

## 2023-03-23 LAB — HEMOGLOBIN AND HEMATOCRIT, BLOOD
HCT: 21.9 % — ABNORMAL LOW (ref 39.0–52.0)
Hemoglobin: 7.5 g/dL — ABNORMAL LOW (ref 13.0–17.0)

## 2023-03-23 LAB — PLATELET COUNT: Platelets: 148 10*3/uL — ABNORMAL LOW (ref 150–400)

## 2023-03-23 LAB — LIPOPROTEIN A (LPA): Lipoprotein (a): 33 nmol/L — ABNORMAL HIGH (ref <75.0)

## 2023-03-23 LAB — PREPARE RBC (CROSSMATCH)

## 2023-03-23 SURGERY — CORONARY ARTERY BYPASS GRAFTING (CABG)
Anesthesia: General | Site: Chest

## 2023-03-23 MED ORDER — LACTATED RINGERS IV SOLN: INTRAVENOUS | Status: DC | PRN

## 2023-03-23 MED ORDER — PHENYLEPHRINE 80 MCG/ML (10ML) SYRINGE FOR IV PUSH (FOR BLOOD PRESSURE SUPPORT)
PREFILLED_SYRINGE | INTRAVENOUS | Status: AC
  Filled 2023-03-23: qty 10

## 2023-03-23 MED ORDER — SODIUM CHLORIDE 0.9 % IV SOLN: INTRAVENOUS | Status: DC | PRN

## 2023-03-23 MED ORDER — INSULIN REGULAR(HUMAN) IN NACL 100-0.9 UT/100ML-% IV SOLN: INTRAVENOUS | Status: DC

## 2023-03-23 MED ORDER — LIDOCAINE 2% (20 MG/ML) 5 ML SYRINGE
INTRAMUSCULAR | Status: AC
  Filled 2023-03-23: qty 5

## 2023-03-23 MED ORDER — PROPOFOL 10 MG/ML IV BOLUS
INTRAVENOUS | Status: DC | PRN
  Administered 2023-03-23 (×2): 20 mg via INTRAVENOUS
  Administered 2023-03-23: 70 mg via INTRAVENOUS
  Administered 2023-03-23: 20 mg via INTRAVENOUS

## 2023-03-23 MED ORDER — PROTAMINE SULFATE 10 MG/ML IV SOLN
INTRAVENOUS | Status: AC
  Filled 2023-03-23: qty 5

## 2023-03-23 MED ORDER — MIDAZOLAM HCL (PF) 5 MG/ML IJ SOLN
INTRAMUSCULAR | Status: DC | PRN
  Administered 2023-03-23: 1 mg via INTRAVENOUS
  Administered 2023-03-23: 3 mg via INTRAVENOUS
  Administered 2023-03-23: 2 mg via INTRAVENOUS
  Administered 2023-03-23: 1 mg via INTRAVENOUS
  Administered 2023-03-23: 3 mg via INTRAVENOUS

## 2023-03-23 MED ORDER — LACTATED RINGERS IV SOLN: INTRAVENOUS | Status: DC

## 2023-03-23 MED ORDER — SODIUM CHLORIDE 0.9 % IV SOLN: INTRAVENOUS | Status: DC

## 2023-03-23 MED ORDER — SODIUM CHLORIDE 0.9 % IV SOLN: 250.0000 mL | INTRAVENOUS | Status: AC

## 2023-03-23 MED ORDER — HEMOSTATIC AGENTS (NO CHARGE) OPTIME
TOPICAL | Status: DC | PRN
  Administered 2023-03-23: 1 via TOPICAL

## 2023-03-23 MED ORDER — SODIUM CHLORIDE 0.45 % IV SOLN: INTRAVENOUS | Status: AC | PRN

## 2023-03-23 MED ORDER — METOPROLOL TARTRATE 5 MG/5ML IV SOLN: 2.5000 mg | INTRAVENOUS | Status: DC | PRN

## 2023-03-23 MED ORDER — ROCURONIUM BROMIDE 10 MG/ML (PF) SYRINGE
PREFILLED_SYRINGE | INTRAVENOUS | Status: AC
  Filled 2023-03-23: qty 10

## 2023-03-23 MED ORDER — 0.9 % SODIUM CHLORIDE (POUR BTL) OPTIME
TOPICAL | Status: DC | PRN
  Administered 2023-03-23: 5000 mL

## 2023-03-23 MED ORDER — FENTANYL CITRATE (PF) 250 MCG/5ML IJ SOLN
INTRAMUSCULAR | Status: AC
  Filled 2023-03-23: qty 5

## 2023-03-23 MED ORDER — LIDOCAINE 2% (20 MG/ML) 5 ML SYRINGE
INTRAMUSCULAR | Status: DC | PRN
  Administered 2023-03-23: 60 mg via INTRAVENOUS

## 2023-03-23 MED ORDER — SODIUM CHLORIDE (PF) 0.9 % IJ SOLN
INTRAMUSCULAR | Status: AC
Start: 2023-03-23 — End: ?
  Filled 2023-03-23: qty 10

## 2023-03-23 MED ORDER — ACETAMINOPHEN 160 MG/5ML PO SOLN: 1000.0000 mg | Freq: Four times a day (QID) | ORAL | Status: AC

## 2023-03-23 MED ORDER — ALBUMIN HUMAN 5 % IV SOLN: INTRAVENOUS | Status: DC | PRN

## 2023-03-23 MED ORDER — METOPROLOL TARTRATE 12.5 MG HALF TABLET
12.5000 mg | ORAL_TABLET | Freq: Two times a day (BID) | ORAL | Status: DC
  Filled 2023-03-23: qty 1

## 2023-03-23 MED ORDER — PHENYLEPHRINE 80 MCG/ML (10ML) SYRINGE FOR IV PUSH (FOR BLOOD PRESSURE SUPPORT)
PREFILLED_SYRINGE | INTRAVENOUS | Status: DC | PRN
  Administered 2023-03-23 (×9): 80 ug via INTRAVENOUS
  Administered 2023-03-23: 40 ug via INTRAVENOUS
  Administered 2023-03-23: 80 ug via INTRAVENOUS
  Administered 2023-03-23: 40 ug via INTRAVENOUS
  Administered 2023-03-23 (×3): 80 ug via INTRAVENOUS
  Administered 2023-03-23: 40 ug via INTRAVENOUS
  Administered 2023-03-23: 80 ug via INTRAVENOUS
  Administered 2023-03-23 (×2): 40 ug via INTRAVENOUS
  Administered 2023-03-23 (×2): 80 ug via INTRAVENOUS

## 2023-03-23 MED ORDER — PROTAMINE SULFATE 10 MG/ML IV SOLN
INTRAVENOUS | Status: DC | PRN
  Administered 2023-03-23: 280 mg via INTRAVENOUS

## 2023-03-23 MED ORDER — ACETAMINOPHEN 500 MG PO TABS
1000.0000 mg | ORAL_TABLET | Freq: Four times a day (QID) | ORAL | Status: AC
  Administered 2023-03-23 – 2023-03-28 (×19): 1000 mg via ORAL
  Filled 2023-03-23 (×19): qty 2

## 2023-03-23 MED ORDER — FENTANYL CITRATE (PF) 250 MCG/5ML IJ SOLN
INTRAMUSCULAR | Status: DC | PRN
  Administered 2023-03-23: 200 ug via INTRAVENOUS
  Administered 2023-03-23 (×2): 100 ug via INTRAVENOUS
  Administered 2023-03-23: 150 ug via INTRAVENOUS
  Administered 2023-03-23 (×2): 100 ug via INTRAVENOUS
  Administered 2023-03-23: 50 ug via INTRAVENOUS
  Administered 2023-03-23: 100 ug via INTRAVENOUS
  Administered 2023-03-23: 50 ug via INTRAVENOUS
  Administered 2023-03-23: 150 ug via INTRAVENOUS
  Administered 2023-03-23: 100 ug via INTRAVENOUS
  Administered 2023-03-23: 50 ug via INTRAVENOUS

## 2023-03-23 MED ORDER — CHLORHEXIDINE GLUCONATE 0.12 % MT SOLN
15.0000 mL | Freq: Once | OROMUCOSAL | Status: AC
  Administered 2023-03-23: 15 mL via OROMUCOSAL

## 2023-03-23 MED ORDER — ALBUMIN HUMAN 5 % IV SOLN
250.0000 mL | INTRAVENOUS | Status: DC | PRN
  Administered 2023-03-23 – 2023-03-24 (×3): 12.5 g via INTRAVENOUS
  Filled 2023-03-23: qty 250

## 2023-03-23 MED ORDER — DEXTROSE 50 % IV SOLN: 0.0000 mL | INTRAVENOUS | Status: DC | PRN

## 2023-03-23 MED ORDER — PROTAMINE SULFATE 10 MG/ML IV SOLN
INTRAVENOUS | Status: AC
  Filled 2023-03-23: qty 25

## 2023-03-23 MED ORDER — PANTOPRAZOLE SODIUM 40 MG IV SOLR
40.0000 mg | Freq: Every day | INTRAVENOUS | Status: AC
  Administered 2023-03-23 – 2023-03-24 (×2): 40 mg via INTRAVENOUS
  Filled 2023-03-23 (×2): qty 10

## 2023-03-23 MED ORDER — ASPIRIN 325 MG PO TBEC
325.0000 mg | DELAYED_RELEASE_TABLET | Freq: Every day | ORAL | Status: DC
  Administered 2023-03-24 – 2023-03-28 (×5): 325 mg via ORAL
  Filled 2023-03-23 (×5): qty 1

## 2023-03-23 MED ORDER — ASPIRIN 81 MG PO CHEW: 324.0000 mg | CHEWABLE_TABLET | Freq: Every day | ORAL | Status: DC

## 2023-03-23 MED ORDER — BISACODYL 5 MG PO TBEC
10.0000 mg | DELAYED_RELEASE_TABLET | Freq: Every day | ORAL | Status: DC
  Administered 2023-03-24 – 2023-03-29 (×5): 10 mg via ORAL
  Filled 2023-03-23 (×5): qty 2

## 2023-03-23 MED ORDER — MIDAZOLAM HCL 2 MG/2ML IJ SOLN: 2.0000 mg | INTRAMUSCULAR | Status: DC | PRN

## 2023-03-23 MED ORDER — ASPIRIN 81 MG PO CHEW
324.0000 mg | CHEWABLE_TABLET | Freq: Once | ORAL | Status: AC
  Administered 2023-03-23: 324 mg via ORAL
  Filled 2023-03-23: qty 4

## 2023-03-23 MED ORDER — ORAL CARE MOUTH RINSE: 15.0000 mL | Freq: Once | OROMUCOSAL | Status: AC

## 2023-03-23 MED ORDER — VASOPRESSIN 20 UNIT/ML IV SOLN
INTRAVENOUS | Status: AC
  Filled 2023-03-23: qty 1

## 2023-03-23 MED ORDER — NITROGLYCERIN IN D5W 200-5 MCG/ML-% IV SOLN: 0.0000 ug/min | INTRAVENOUS | Status: DC

## 2023-03-23 MED ORDER — PANTOPRAZOLE SODIUM 40 MG PO TBEC
40.0000 mg | DELAYED_RELEASE_TABLET | Freq: Every day | ORAL | Status: DC
  Administered 2023-03-25 – 2023-03-30 (×6): 40 mg via ORAL
  Filled 2023-03-23 (×6): qty 1

## 2023-03-23 MED ORDER — DEXAMETHASONE SODIUM PHOSPHATE 10 MG/ML IJ SOLN
INTRAMUSCULAR | Status: DC | PRN
  Administered 2023-03-23: 10 mg via INTRAVENOUS

## 2023-03-23 MED ORDER — ROCURONIUM BROMIDE 10 MG/ML (PF) SYRINGE
PREFILLED_SYRINGE | INTRAVENOUS | Status: DC | PRN
  Administered 2023-03-23: 40 mg via INTRAVENOUS
  Administered 2023-03-23: 30 mg via INTRAVENOUS
  Administered 2023-03-23: 60 mg via INTRAVENOUS
  Administered 2023-03-23 (×2): 20 mg via INTRAVENOUS

## 2023-03-23 MED ORDER — SODIUM CHLORIDE 0.9 % IV SOLN: INTRAVENOUS | Status: AC

## 2023-03-23 MED ORDER — EPHEDRINE 5 MG/ML INJ
INTRAVENOUS | Status: AC
  Filled 2023-03-23: qty 5

## 2023-03-23 MED ORDER — ATORVASTATIN CALCIUM 80 MG PO TABS
80.0000 mg | ORAL_TABLET | Freq: Every day | ORAL | Status: DC
  Administered 2023-03-24 – 2023-03-30 (×7): 80 mg via ORAL
  Filled 2023-03-23 (×7): qty 1

## 2023-03-23 MED ORDER — LACTATED RINGERS IV SOLN: INTRAVENOUS | Status: AC

## 2023-03-23 MED ORDER — BISACODYL 10 MG RE SUPP: 10.0000 mg | Freq: Every day | RECTAL | Status: DC

## 2023-03-23 MED ORDER — MORPHINE SULFATE (PF) 2 MG/ML IV SOLN
1.0000 mg | INTRAVENOUS | Status: DC | PRN
  Administered 2023-03-23: 1 mg via INTRAVENOUS
  Administered 2023-03-24: 2 mg via INTRAVENOUS
  Filled 2023-03-23 (×2): qty 1

## 2023-03-23 MED ORDER — SODIUM BICARBONATE 8.4 % IV SOLN
100.0000 meq | Freq: Once | INTRAVENOUS | Status: AC
  Administered 2023-03-23: 100 meq via INTRAVENOUS

## 2023-03-23 MED ORDER — ONDANSETRON HCL 4 MG/2ML IJ SOLN
INTRAMUSCULAR | Status: DC | PRN
  Administered 2023-03-23: 4 mg via INTRAVENOUS

## 2023-03-23 MED ORDER — OXYCODONE HCL 5 MG PO TABS
5.0000 mg | ORAL_TABLET | ORAL | Status: DC | PRN
  Administered 2023-03-23: 10 mg via ORAL
  Administered 2023-03-24: 5 mg via ORAL
  Administered 2023-03-24 (×2): 10 mg via ORAL
  Filled 2023-03-23 (×2): qty 2
  Filled 2023-03-23: qty 1
  Filled 2023-03-23: qty 2

## 2023-03-23 MED ORDER — GLYCOPYRROLATE 0.2 MG/ML IJ SOLN: INTRAMUSCULAR | Status: DC | PRN

## 2023-03-23 MED ORDER — METOPROLOL TARTRATE 25 MG/10 ML ORAL SUSPENSION: 12.5000 mg | Freq: Two times a day (BID) | ORAL | Status: DC

## 2023-03-23 MED ORDER — POTASSIUM CHLORIDE 10 MEQ/50ML IV SOLN: 10.0000 meq | INTRAVENOUS | Status: AC

## 2023-03-23 MED ORDER — DOCUSATE SODIUM 100 MG PO CAPS
200.0000 mg | ORAL_CAPSULE | Freq: Every day | ORAL | Status: DC
  Administered 2023-03-24 – 2023-03-30 (×6): 200 mg via ORAL
  Filled 2023-03-23 (×6): qty 2

## 2023-03-23 MED ORDER — CHLORHEXIDINE GLUCONATE 0.12 % MT SOLN
15.0000 mL | OROMUCOSAL | Status: AC
  Administered 2023-03-23: 15 mL via OROMUCOSAL
  Filled 2023-03-23: qty 15

## 2023-03-23 MED ORDER — ONDANSETRON HCL 4 MG/2ML IJ SOLN
4.0000 mg | Freq: Four times a day (QID) | INTRAMUSCULAR | Status: DC | PRN
Start: 2023-03-23 — End: 2023-03-30
  Administered 2023-03-25: 4 mg via INTRAVENOUS
  Filled 2023-03-23: qty 2

## 2023-03-23 MED ORDER — PLASMA-LYTE A IV SOLN: INTRAVENOUS | Status: DC | PRN

## 2023-03-23 MED ORDER — EPHEDRINE SULFATE-NACL 50-0.9 MG/10ML-% IV SOSY
PREFILLED_SYRINGE | INTRAVENOUS | Status: DC | PRN
  Administered 2023-03-23: 5 mg via INTRAVENOUS

## 2023-03-23 MED ORDER — HEPARIN SODIUM (PORCINE) 1000 UNIT/ML IJ SOLN
INTRAMUSCULAR | Status: AC
  Filled 2023-03-23: qty 1

## 2023-03-23 MED ORDER — CHLORHEXIDINE GLUCONATE CLOTH 2 % EX PADS
6.0000 | MEDICATED_PAD | Freq: Every day | CUTANEOUS | Status: DC
  Administered 2023-03-24 – 2023-03-28 (×5): 6 via TOPICAL

## 2023-03-23 MED ORDER — ACETAMINOPHEN 160 MG/5ML PO SOLN
650.0000 mg | Freq: Once | ORAL | Status: AC
  Administered 2023-03-23: 650 mg
  Filled 2023-03-23: qty 20.3

## 2023-03-23 MED ORDER — LEVOTHYROXINE SODIUM 25 MCG PO TABS
125.0000 ug | ORAL_TABLET | Freq: Every day | ORAL | Status: DC
  Administered 2023-03-24 – 2023-03-30 (×7): 125 ug via ORAL
  Filled 2023-03-23 (×7): qty 1

## 2023-03-23 MED ORDER — SODIUM CHLORIDE 0.9% FLUSH
3.0000 mL | Freq: Two times a day (BID) | INTRAVENOUS | Status: DC
  Administered 2023-03-24 – 2023-03-27 (×7): 3 mL via INTRAVENOUS
  Administered 2023-03-27: 10 mL via INTRAVENOUS
  Administered 2023-03-28: 3 mL via INTRAVENOUS

## 2023-03-23 MED ORDER — PHENYLEPHRINE 80 MCG/ML (10ML) SYRINGE FOR IV PUSH (FOR BLOOD PRESSURE SUPPORT)
PREFILLED_SYRINGE | INTRAVENOUS | Status: AC
  Filled 2023-03-23: qty 20

## 2023-03-23 MED ORDER — TRAMADOL HCL 50 MG PO TABS
50.0000 mg | ORAL_TABLET | ORAL | Status: DC | PRN
  Administered 2023-03-24 – 2023-03-25 (×4): 100 mg via ORAL
  Filled 2023-03-23 (×4): qty 2

## 2023-03-23 MED ORDER — METOCLOPRAMIDE HCL 5 MG/ML IJ SOLN
10.0000 mg | Freq: Four times a day (QID) | INTRAMUSCULAR | Status: AC
  Administered 2023-03-23 – 2023-03-25 (×6): 10 mg via INTRAVENOUS
  Filled 2023-03-23 (×6): qty 2

## 2023-03-23 MED ORDER — MAGNESIUM SULFATE 4 GM/100ML IV SOLN
4.0000 g | Freq: Once | INTRAVENOUS | Status: AC
  Administered 2023-03-23: 4 g via INTRAVENOUS
  Filled 2023-03-23: qty 100

## 2023-03-23 MED ORDER — DEXMEDETOMIDINE HCL IN NACL 400 MCG/100ML IV SOLN: 0.0000 ug/kg/h | INTRAVENOUS | Status: DC

## 2023-03-23 MED ORDER — SODIUM CHLORIDE 0.9% FLUSH: 3.0000 mL | INTRAVENOUS | Status: DC | PRN

## 2023-03-23 MED ORDER — PHENYLEPHRINE HCL-NACL 20-0.9 MG/250ML-% IV SOLN
0.0000 ug/min | INTRAVENOUS | Status: DC
  Administered 2023-03-23: 20 ug/min via INTRAVENOUS
  Administered 2023-03-24: 10 ug/min via INTRAVENOUS
  Filled 2023-03-23: qty 250

## 2023-03-23 MED ORDER — PROPOFOL 10 MG/ML IV BOLUS
INTRAVENOUS | Status: AC
  Filled 2023-03-23: qty 20

## 2023-03-23 MED ORDER — MIDAZOLAM HCL (PF) 10 MG/2ML IJ SOLN
INTRAMUSCULAR | Status: AC
  Filled 2023-03-23: qty 2

## 2023-03-23 MED ORDER — CEFAZOLIN SODIUM-DEXTROSE 2-4 GM/100ML-% IV SOLN
2.0000 g | Freq: Three times a day (TID) | INTRAVENOUS | Status: AC
  Administered 2023-03-23 – 2023-03-25 (×6): 2 g via INTRAVENOUS
  Filled 2023-03-23 (×6): qty 100

## 2023-03-23 MED ORDER — VANCOMYCIN HCL IN DEXTROSE 1-5 GM/200ML-% IV SOLN
1000.0000 mg | Freq: Once | INTRAVENOUS | Status: AC
  Administered 2023-03-23: 1000 mg via INTRAVENOUS
  Filled 2023-03-23: qty 200

## 2023-03-23 MED ORDER — HEPARIN SODIUM (PORCINE) 1000 UNIT/ML IJ SOLN
INTRAMUSCULAR | Status: DC | PRN
  Administered 2023-03-23: 26000 [IU] via INTRAVENOUS
  Administered 2023-03-23: 2000 [IU] via INTRAVENOUS

## 2023-03-23 SURGICAL SUPPLY — 89 items
ADAPTER CARDIO PERF ANTE/RETRO (ADAPTER) ×1 IMPLANT
APPLIER CLIP 9.375 SM OPEN (CLIP)
BAG DECANTER FOR FLEXI CONT (MISCELLANEOUS) ×3 IMPLANT
BLADE STERNUM SYSTEM 6 (BLADE) ×3 IMPLANT
BLADE SURG 15 STRL LF DISP TIS (BLADE) IMPLANT
BNDG ELASTIC 4INX 5YD STR LF (GAUZE/BANDAGES/DRESSINGS) ×1 IMPLANT
BNDG ELASTIC 6INX 5YD STR LF (GAUZE/BANDAGES/DRESSINGS) ×1 IMPLANT
BNDG GAUZE DERMACEA FLUFF 4 (GAUZE/BANDAGES/DRESSINGS) ×3 IMPLANT
CANISTER SUCT 3000ML PPV (MISCELLANEOUS) ×3 IMPLANT
CANNULA EZ GLIDE AORTIC 21FR (CANNULA) ×3 IMPLANT
CANNULA GUNDRY RCSP 15FR (MISCELLANEOUS) ×1 IMPLANT
CANNULA MC2 2 STG 36/46 NON-V (CANNULA) ×1 IMPLANT
CATH CPB KIT HENDRICKSON (MISCELLANEOUS) ×3 IMPLANT
CATH ROBINSON RED A/P 18FR (CATHETERS) ×3 IMPLANT
CATH THORACIC 36FR (CATHETERS) ×3 IMPLANT
CATH THORACIC 36FR RT ANG (CATHETERS) ×4 IMPLANT
CLIP APPLIE 9.375 SM OPEN (CLIP) IMPLANT
CLIP TI MEDIUM 24 (CLIP) IMPLANT
CLIP TI WIDE RED SMALL 24 (CLIP) ×3 IMPLANT
CONTAINER PROTECT SURGISLUSH (MISCELLANEOUS) ×6 IMPLANT
COVER MAYO STAND STRL (DRAPES) IMPLANT
CUFF TOURN SGL QUICK 18X4 (TOURNIQUET CUFF) IMPLANT
CUFF TRNQT CYL 24X4X16.5-23 (TOURNIQUET CUFF) IMPLANT
DRAPE EXTREMITY T 121X128X90 (DISPOSABLE) ×2 IMPLANT
DRAPE HALF SHEET 40X57 (DRAPES) IMPLANT
DRAPE SRG 135X102X78XABS (DRAPES) ×3 IMPLANT
DRAPE WARM FLUID 44X44 (DRAPES) ×3 IMPLANT
DRSG COVADERM 4X14 (GAUZE/BANDAGES/DRESSINGS) ×3 IMPLANT
ELECT REM PT RETURN 9FT ADLT (ELECTROSURGICAL) ×6
ELECTRODE REM PT RTRN 9FT ADLT (ELECTROSURGICAL) ×6 IMPLANT
FELT TEFLON 1X6 (MISCELLANEOUS) ×5 IMPLANT
GAUZE 4X4 16PLY ~~LOC~~+RFID DBL (SPONGE) ×3 IMPLANT
GAUZE SPONGE 4X4 12PLY STRL (GAUZE/BANDAGES/DRESSINGS) ×6 IMPLANT
GEL ULTRASOUND 20GR AQUASONIC (MISCELLANEOUS) IMPLANT
GLOVE SS BIOGEL STRL SZ 7.5 (GLOVE) ×3 IMPLANT
GLOVE SURG SIGNA 7.5 PF LTX (GLOVE) ×9 IMPLANT
GOWN STRL REUS W/ TWL LRG LVL3 (GOWN DISPOSABLE) ×12 IMPLANT
GOWN STRL REUS W/ TWL XL LVL3 (GOWN DISPOSABLE) ×6 IMPLANT
HEMOSTAT POWDER SURGIFOAM 1G (HEMOSTASIS) ×9 IMPLANT
HEMOSTAT SURGICEL 2X14 (HEMOSTASIS) ×3 IMPLANT
INSERT FOGARTY XLG (MISCELLANEOUS) IMPLANT
KIT BASIN OR (CUSTOM PROCEDURE TRAY) ×3 IMPLANT
KIT SUCTION CATH 14FR (SUCTIONS) ×6 IMPLANT
KIT TURNOVER KIT B (KITS) ×3 IMPLANT
KIT VASOVIEW HEMOPRO 2 VH 4000 (KITS) ×3 IMPLANT
MARKER GRAFT CORONARY BYPASS (MISCELLANEOUS) ×9 IMPLANT
NS IRRIG 1000ML POUR BTL (IV SOLUTION) ×15 IMPLANT
PACK E OPEN HEART (SUTURE) ×3 IMPLANT
PACK OPEN HEART (CUSTOM PROCEDURE TRAY) ×3 IMPLANT
PAD ARMBOARD 7.5X6 YLW CONV (MISCELLANEOUS) ×6 IMPLANT
PAD ELECT DEFIB RADIOL ZOLL (MISCELLANEOUS) ×3 IMPLANT
PENCIL BUTTON HOLSTER BLD 10FT (ELECTRODE) ×3 IMPLANT
POSITIONER HEAD DONUT 9IN (MISCELLANEOUS) ×3 IMPLANT
PUNCH AORTIC ROTATE 4.5MM 8IN (MISCELLANEOUS) ×1 IMPLANT
SET MPS 3-ND DEL (MISCELLANEOUS) ×1 IMPLANT
SHEARS HARMONIC 9CM CVD (BLADE) ×2 IMPLANT
SPONGE T-LAP 4X18 ~~LOC~~+RFID (SPONGE) ×3 IMPLANT
STOPCOCK 4 WAY LG BORE MALE ST (IV SETS) ×1 IMPLANT
SUPPORT HEART JANKE-BARRON (MISCELLANEOUS) ×3 IMPLANT
SUT BONE WAX W31G (SUTURE) ×3 IMPLANT
SUT MNCRL AB 4-0 PS2 18 (SUTURE) ×1 IMPLANT
SUT PROLENE 3 0 SH DA (SUTURE) ×3 IMPLANT
SUT PROLENE 4 0 SH DA (SUTURE) ×2 IMPLANT
SUT PROLENE 4-0 RB1 .5 CRCL 36 (SUTURE) ×6 IMPLANT
SUT PROLENE 5 0 C 1 36 (SUTURE) ×1 IMPLANT
SUT PROLENE 6 0 C 1 30 (SUTURE) ×7 IMPLANT
SUT PROLENE 7 0 BV 1 (SUTURE) ×1 IMPLANT
SUT PROLENE 7 0 BV1 MDA (SUTURE) ×2 IMPLANT
SUT PROLENE 8 0 BV175 6 (SUTURE) ×2 IMPLANT
SUT SILK 1 MH (SUTURE) ×1 IMPLANT
SUT STEEL 6MS V (SUTURE) ×3 IMPLANT
SUT STEEL STERNAL CCS#1 18IN (SUTURE) IMPLANT
SUT STEEL SZ 6 DBL 3X14 BALL (SUTURE) ×3 IMPLANT
SUT VIC AB 1 CTX36XBRD ANBCTR (SUTURE) ×6 IMPLANT
SUT VIC AB 2-0 CT1 TAPERPNT 27 (SUTURE) IMPLANT
SUT VIC AB 2-0 CTX 27 (SUTURE) IMPLANT
SUT VIC AB 3-0 SH 27X BRD (SUTURE) IMPLANT
SUT VIC AB 3-0 X1 27 (SUTURE) IMPLANT
SYR 50ML SLIP (SYRINGE) IMPLANT
SYSTEM SAHARA CHEST DRAIN ATS (WOUND CARE) ×3 IMPLANT
TAPE CLOTH SURG 4X10 WHT LF (GAUZE/BANDAGES/DRESSINGS) ×1 IMPLANT
TAPE PAPER 2X10 WHT MICROPORE (GAUZE/BANDAGES/DRESSINGS) ×1 IMPLANT
TOWEL GREEN STERILE (TOWEL DISPOSABLE) ×3 IMPLANT
TOWEL GREEN STERILE FF (TOWEL DISPOSABLE) ×3 IMPLANT
TRAY FOLEY SLVR 16FR TEMP STAT (SET/KITS/TRAYS/PACK) ×3 IMPLANT
TUBING LAP HI FLOW INSUFFLATIO (TUBING) ×3 IMPLANT
UNDERPAD 30X36 HEAVY ABSORB (UNDERPADS AND DIAPERS) ×3 IMPLANT
WATER STERILE IRR 1000ML POUR (IV SOLUTION) ×6 IMPLANT
YANKAUER SUCT BULB TIP NO VENT (SUCTIONS) ×1 IMPLANT

## 2023-03-23 NOTE — Hospital Course (Addendum)
HPI:  The patient is a 62 year old male we are asked to see in CT surgical consultation for consideration of coronary artery bypass grafting.  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  He underwent coronary CTA in December which showed a significantly elevated coronary calcium score of 1175 which was 97 percentile.  It showed evidence of severe proximal left main stenosis as well as circumflex and obtuse marginal 1 disease.  There was mild RCA stenosis.  FFR was significant for the ostial left main and RCA lesions.  He describes chest pain with exertion for the past approximate 2 months.  Primarily it is midsternal but there is some radiation to neck and left shoulder.  He denies palpitations or lower extremity edema.  He denies orthopnea.  He does get shortness of breath with these events and they have been worsening over time.  Symptoms typically resolve after about 5 minutes of resting.  He works in [employer] and his job is physically taxing.  On an office visit on 03/18/2023 he reported symptoms consistent with unstable angina and was sent to the emergency room.  He was admitted for further evaluation and treatment to include cardiac catheterization.  Cardiac catheterization done 03/21/2023 confirmed the ostial left main stenosis at 70% with significant catheter dampening.  There was also a mid 50-60% LAD lesion.  He was found to have severe ostial RCA disease in the 70 to 80% range.  He has a normal LVEDP.  Echocardiogram shows normal LV function with no significant valvular disease.  The report is as described below.  He was felt best suited in terms of coronary revascularization to undergo CABG and was transferred to [facility] for surgical evaluation. Dr. Dorris Fetch discussed the need for coronary artery bypass grafting surgery. Potential risks,  benefits, and complications of the surgery were discussed and he agreed to proceed with surgery. Pre operative carotid duplex US showed no significant internal carotid artery stenosis bilaterally.  Hospital Course: Patient underwent a CABG x 3. He was transported from the OR to [facility] ICU in stable condition. He was extubated later the evening of surgery. He was weaned off Neo Synephrine drip. Theone Murdoch, a line,chest tubes, and foley were removed early in his post operative course. He was started on Lopressor. He was transitioned off the Insulin drip. His pre op HGA1C was 5.5. Accu checks and SS PRN will be stopped upon transfer to the floor. Patient's oxygen requirements increased so Pulmonary/CCM was consulted. Patient was weak at using incentive spirometer. He was given IV Lasix and put on bi pap at night. Creatinine did increase post op to 2.44 on postop day 3 and then began to trend back down. Of note, his creatinine prior to admission was 1.4 and a few years ago was 1.5-1.6.  The norepinephrine was weaned off and continued blood pressure support was managed with oral midodrine 10 mg 3 times daily.  Beta-blocker was not initiated early postoperatively due to relative hypotension.  He was put on Midodrine. He continued to have high oxygen requirement likely due to SIRS.  Aggressive diuresis was not employed due to his renal insufficiency.  The chest tubes were removed on postop day 3.  Activity was advanced.  Oxygenation continued to improve as he spontaneously diuresed.  The patient wires were removed on postop day 4.  He remained in ICU for careful monitoring of his respiratory status. He had  expected post op blood loss anemia. He was put on Trigels. Last H and H was stable at 8.3 and 24.1. He was stable for transfer from the ICU to 4E for further convalescence on 12/16. He has been ambulating on room air with good oxygenation. All wounds are clean, dry healing without signs of infection. Chest tube wounds  dehisced but are not draining much (sero sanguinous). He has been tolerating a diet and has had a bowel movement. Midodrine was stopped on 12/17. He is stable for discharge.

## 2023-03-23 NOTE — Anesthesia Procedure Notes (Signed)
Central Venous Catheter Insertion Performed by: Marcene Duos, MD, anesthesiologist Start/End12/02/2023 7:45 AM, 03/23/2023 7:55 AM Patient location: Pre-op. Preanesthetic checklist: patient identified, IV checked, site marked, risks and benefits discussed, surgical consent, monitors and equipment checked, pre-op evaluation, timeout performed and anesthesia consent Hand hygiene performed  and maximum sterile barriers used  PA cath was placed.Swan type:thermodilution PA Cath depth:50 Procedure performed without using ultrasound guided technique. Attempts: 1 Patient tolerated the procedure well with no immediate complications.

## 2023-03-23 NOTE — Anesthesia Procedure Notes (Signed)
Procedure Name: Intubation Date/Time: 03/23/2023 8:52 AM  Performed by: Yolonda Kida, CRNAPre-anesthesia Checklist: Patient identified, Emergency Drugs available, Suction available and Patient being monitored Patient Re-evaluated:Patient Re-evaluated prior to induction Oxygen Delivery Method: Circle System Utilized Preoxygenation: Pre-oxygenation with 100% oxygen Induction Type: IV induction Ventilation: Mask ventilation without difficulty Laryngoscope Size: Mac and 4 Grade View: Grade I Tube type: Oral Tube size: 8.0 mm Number of attempts: 1 Airway Equipment and Method: Stylet Placement Confirmation: ETT inserted through vocal cords under direct vision, positive ETCO2 and breath sounds checked- equal and bilateral Secured at: 22 cm Tube secured with: Tape Dental Injury: Teeth and Oropharynx as per pre-operative assessment

## 2023-03-23 NOTE — Procedures (Signed)
Extubation Procedure Note  Patient Details:   Name: [name] DOB: 21-May-1960 MRN: [id number]   Airway Documentation:    Vent end date: 03/23/23 Vent end time: 2115   Evaluation  O2 sats: stable throughout Complications: No apparent complications Patient did tolerate procedure well. Bilateral Breath Sounds: Clear, Diminished   Yes  Pt. Extubated per protocol and per dr. Leafy Ro. Pt. VC .7 Liters, NIF -28. Pt. Pt. Performed 750 on the incentive spirometer.pt. Had a positive cuff leak and was able to speak his name after extubation. No issues at this time. Pt. Currently on 6L nasal cannula.  Adela Ports 03/23/2023, 9:18 PM

## 2023-03-23 NOTE — Progress Notes (Signed)
Primary RN attempted handoff to CRNA. No answer at [phone]. Patient transported to pre-op by D. Rmah, RN.

## 2023-03-23 NOTE — Progress Notes (Signed)
Wean protocol initiated. 

## 2023-03-23 NOTE — Discharge Instructions (Addendum)

## 2023-03-23 NOTE — Plan of Care (Signed)
   Problem: Education: Goal: Knowledge of General Education information will improve Description: Including pain rating scale, medication(s)/side effects and non-pharmacologic comfort measures Outcome: Progressing   Problem: Health Behavior/Discharge Planning: Goal: Ability to manage health-related needs will improve Outcome: Progressing   Problem: Clinical Measurements: Goal: Ability to maintain clinical measurements within normal limits will improve Outcome: Progressing Goal: Will remain free from infection Outcome: Progressing Goal: Diagnostic test results will improve Outcome: Progressing Goal: Respiratory complications will improve Outcome: Progressing Goal: Cardiovascular complication will be avoided Outcome: Progressing   Problem: Activity: Goal: Risk for activity intolerance will decrease Outcome: Progressing   Problem: Nutrition: Goal: Adequate nutrition will be maintained Outcome: Progressing   Problem: Coping: Goal: Level of anxiety will decrease Outcome: Progressing   Problem: Elimination: Goal: Will not experience complications related to bowel motility Outcome: Progressing Goal: Will not experience complications related to urinary retention Outcome: Progressing   Problem: Pain Management: Goal: General experience of comfort will improve Outcome: Progressing   Problem: Safety: Goal: Ability to remain free from injury will improve Outcome: Progressing   Problem: Skin Integrity: Goal: Risk for impaired skin integrity will decrease Outcome: Progressing   Problem: Education: Goal: Understanding of cardiac disease, CV risk reduction, and recovery process will improve Outcome: Progressing Goal: Individualized Educational Video(s) Outcome: Progressing   Problem: Activity: Goal: Ability to tolerate increased activity will improve Outcome: Progressing   Problem: Cardiac: Goal: Ability to achieve and maintain adequate cardiovascular perfusion will  improve Outcome: Progressing   Problem: Health Behavior/Discharge Planning: Goal: Ability to safely manage health-related needs after discharge will improve Outcome: Progressing

## 2023-03-23 NOTE — Anesthesia Preprocedure Evaluation (Signed)
Anesthesia Evaluation  Patient identified by MRN, date of birth, ID band Patient awake    Reviewed: Allergy & Precautions, NPO status , Patient's Chart, lab work & pertinent test results  Airway Mallampati: II  TM Distance: >3 FB     Dental  (+) Dental Advisory Given   Pulmonary Current Smoker and Patient abstained from smoking.   Pulmonary exam normal        Cardiovascular hypertension, Pt. on medications + angina  + CAD and + Peripheral Vascular Disease  Normal cardiovascular exam     Neuro/Psych negative neurological ROS     GI/Hepatic negative GI ROS, Neg liver ROS,,,  Endo/Other  Hypothyroidism    Renal/GU negative Renal ROS     Musculoskeletal  (+) Arthritis ,    Abdominal   Peds  Hematology negative hematology ROS (+)   Anesthesia Other Findings   Reproductive/Obstetrics                             Anesthesia Physical Anesthesia Plan  ASA: 4  Anesthesia Plan: General   Post-op Pain Management:    Induction: Intravenous  PONV Risk Score and Plan: 1 and Dexamethasone and Ondansetron  Airway Management Planned: Oral ETT  Additional Equipment: Arterial line, CVP, PA Cath, TEE and Ultrasound Guidance Line Placement  Intra-op Plan:   Post-operative Plan: Post-operative intubation/ventilation  Informed Consent: I have reviewed the patients History and Physical, chart, labs and discussed the procedure including the risks, benefits and alternatives for the proposed anesthesia with the patient or authorized representative who has indicated his/her understanding and acceptance.     Dental advisory given  Plan Discussed with: CRNA  Anesthesia Plan Comments:        Anesthesia Quick Evaluation

## 2023-03-23 NOTE — Discharge Summary (Addendum)
[address]       [place] [address]             [phone]    Physician Discharge Summary  Patient ID: [name] MRN: [id number] DOB/AGE: 61-Mar-1962 61 y.o.  Admit date: 03/22/2023 Discharge date: 03/30/2023  Admission Diagnoses:  Patient Active Problem List   Diagnosis Date Noted   Acute respiratory failure with hypoxia (HCC) 03/24/2023   S/P CABG x 3 03/23/2023   CAD (coronary artery disease) 03/22/2023   Coronary artery disease involving native coronary artery of native heart 03/21/2023   Hypothyroidism 03/18/2023   History of CAD (coronary artery disease) 03/18/2023   Tobacco abuse 03/18/2023   Hx of transient ischemic attack (TIA)    Unstable angina (HCC) 01/24/2023   Carotid stenosis, 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, left 12/17/2020   Common carotid artery stenosis 11/13/2020   Essential hypertension 11/13/2020   Carotid stenosis, 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, bilateral 10/22/2020   Atherosclerotic peripheral vascular disease with intermittent claudication (HCC) 05/28/2016   Hyperlipidemia, mixed 02/06/2016   Symptomatic carotid artery stenosis without infarction, right 11/20/2015     Discharge Diagnoses:  Patient Active Problem List   Diagnosis Date Noted   Acute respiratory failure with hypoxia (HCC) 03/24/2023   S/P CABG x 3 03/23/2023   CAD (coronary artery disease) 03/22/2023   Coronary artery disease involving native coronary artery of native heart 03/21/2023   Hypothyroidism 03/18/2023   History of CAD (coronary artery disease) 03/18/2023   Tobacco abuse 03/18/2023   Hx of transient ischemic attack (TIA)    Unstable angina (HCC) 01/24/2023   Carotid stenosis, 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, left 12/17/2020   Common carotid artery stenosis 11/13/2020   Essential hypertension 11/13/2020   Carotid stenosis, 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  He underwent coronary CTA in December which showed a significantly elevated coronary calcium score of 1175 which was 97 percentile.  It showed evidence of severe proximal left main stenosis as well as circumflex and obtuse marginal 1 disease.  There was mild RCA stenosis.  FFR was significant for the ostial left main and RCA lesions.  He describes chest pain with exertion for the past approximate 2 months.  Primarily it is midsternal but there is some radiation to neck and left shoulder.  He denies palpitations or lower extremity edema.  He denies orthopnea.  He does get shortness of breath with these events and they have been worsening over time.  Symptoms typically resolve after about 5 minutes of resting.  He works in [employer] and his job is physically taxing.  On an office visit on 03/18/2023 he reported symptoms consistent with unstable angina and was sent to the emergency room.  He was admitted for further evaluation and treatment to include cardiac catheterization.  Cardiac catheterization done 03/21/2023 confirmed the ostial left main stenosis at 70% with significant catheter dampening.  There was also a mid 50-60% LAD lesion.  He was found to have severe ostial RCA disease in the 70 to  80% range.  He has a normal LVEDP.  Echocardiogram shows normal LV function with no significant valvular disease.  The report is as described below.  He was felt best suited in terms of coronary revascularization to undergo CABG and was  transferred to [facility] for surgical evaluation. Dr. Dorris Fetch discussed the need for coronary artery bypass grafting surgery. Potential risks, benefits, and complications of the surgery were discussed and he agreed to proceed with surgery. Pre operative carotid duplex US showed no significant internal carotid artery stenosis bilaterally.  Hospital Course: Patient underwent a CABG x 3. He was transported from the OR to [facility] ICU in stable condition. He was extubated later the evening of surgery. He was weaned off Neo Synephrine drip. Theone Murdoch, a line,chest tubes, and foley were removed early in his post operative course. He was started on Lopressor. He was transitioned off the Insulin drip. His pre op HGA1C was 5.5. Accu checks and SS PRN will be stopped upon transfer to the floor. Patient's oxygen requirements increased so Pulmonary/CCM was consulted. Patient was weak at using incentive spirometer. He was given IV Lasix and put on bi pap at night. Creatinine did increase post op to 2.44 on postop day 3 and then began to trend back down. Of note, his creatinine prior to admission was 1.4 and a few years ago was 1.5-1.6.  The norepinephrine was weaned off and continued blood pressure support was managed with oral midodrine 10 mg 3 times daily.  Beta-blocker was not initiated early postoperatively due to relative hypotension.  He was put on Midodrine. He continued to have high oxygen requirement likely due to SIRS.  Aggressive diuresis was not employed due to his renal insufficiency.  The chest tubes were removed on postop day 3.  Activity was advanced.  Oxygenation continued to improve as he spontaneously diuresed.  The patient wires were removed on postop day 4.  He remained in ICU for careful monitoring of his respiratory status. He had expected post op blood loss anemia. He was put on Trigels. Last H and H was stable at 8.3 and 24.1. He was stable for transfer from the ICU to 4E for further  convalescence on 12/16. He has been ambulating on room air with good oxygenation. All wounds are clean, dry healing without signs of infection. Chest tube wounds dehisced but are not draining much (sero sanguinous). He has been tolerating a diet and has had a bowel movement. Midodrine was stopped on 12/17.  As discussed with Dr. Dorris Fetch, SBP was in the 130's and HR more in the 90's so he was started on low dose Toprol XL He is stable for discharge.  Consults: pulmonary/intensive care  Significant Diagnostic Studies:   Narrative & Impression  CLINICAL DATA:  Status post coronary artery bypass graft.   EXAM: CHEST - 2 VIEW   COMPARISON:  March 27, 2023.   FINDINGS: Stable cardiomegaly. Sternotomy wires are noted. Small bilateral pleural effusions are noted. Minimal perihilar subsegmental atelectasis is noted. No pneumothorax is noted. Bony thorax is unremarkable.   IMPRESSION: Minimal perihilar subsegmental atelectasis. Small bilateral pleural effusions.     Electronically Signed   By: Lupita Raider M.D.   On: 03/29/2023 10:35   Narrative & Impression  CLINICAL DATA:  Respiratory failure with hypoxemia.   EXAM: PORTABLE CHEST 1 VIEW   COMPARISON:  X-ray 03/24/2023.   FINDINGS: Removal of the Swan-Ganz catheter. Residual right IJ Cordis. Mediastinal drains, bilateral chest tubes are seen. Sternal  wires. No pneumothorax. Underinflation. Enlarged cardiopericardial silhouette with persistent lung base opacities. No edema. Question tiny effusions. Overlapping cardiac leads. Epicardial leads.   IMPRESSION: Interval removal of the Swan-Ganz catheter with residual Cordis. No pneumothorax.     Electronically Signed   By: Karen Kays M.D.   On: 03/24/2023 16:57    Treatments: surgery:  Median sternotomy, extracorporeal circulation, coronary artery bypass grafting x 3 (left internal mammary artery to LAD, right internal mammary artery to distal right coronary,  saphenous vein graft to obtuse marginal) by Dr. Dorris Fetch on 03/23/2023.  Discharge Exam: Blood pressure (!) 124/58, pulse 72, temperature 98.9 F (37.2 C), temperature source Oral, resp. rate 20, height 5\' 6"  (1.676 m), weight 79.7 kg, SpO2 99%. Cardiovascular: RRR Pulmonary: Clear to auscultation bilaterally Abdomen: Soft, non tender, bowel sounds present. Extremities: Trace bilateral lower extremity edema. Wounds: Clean and dry.  No erythema or signs of infection. Chest tube wounds open but not draining much (sero sanguinous)     Discharge Medications:  The patient has been discharged on:   1.Beta Blocker:  Yes [  x ]                              No   [   ]                              If No, reason:  2.Ace Inhibitor/ARB: Yes [   ]                                     No  [   x ]                                     If No, reason:Elevated creatinine  3.Statin:   Yes [  x ]                  No  [   ]                  If No, reason:  4.Ecasa:  Yes  [  x ]                  No   [   ]                  If No, reason:  Patient had ACS upon admission:  Plavix/P2Y12 inhibitor: Yes [ x  ]                                      No  [   ]     Discharge Instructions     Amb Referral to Cardiac Rehabilitation   Complete by: As directed    Diagnosis: CABG   CABG X ___: 3   After initial evaluation and assessments completed: Virtual Based Care may be provided alone or in conjunction with Phase 2 Cardiac Rehab based on patient barriers.: Yes   Intensive Cardiac Rehabilitation (ICR) [facility] location only OR Traditional Cardiac Rehabilitation (TCR) *If criteria for ICR are not met will enroll in TCR [facility] only): Yes  Allergies as of 03/30/2023   No Known Allergies      Medication List     STOP taking these medications    amLODipine 5 MG tablet Commonly known as: NORVASC   hydrochlorothiazide 25 MG tablet Commonly known as: HYDRODIURIL   metoprolol tartrate 100 MG  tablet Commonly known as: LOPRESSOR   nitroGLYCERIN 0.4 MG SL tablet Commonly known as: NITROSTAT   omeprazole 20 MG capsule Commonly known as: PRILOSEC       TAKE these medications    acetaminophen 500 MG tablet Commonly known as: TYLENOL Take 1,000 mg by mouth every 6 (six) hours as needed for mild pain.   aspirin EC 81 MG tablet Take 1 tablet (81 mg total) by mouth at bedtime. Swallow whole.   atorvastatin 80 MG tablet Commonly known as: LIPITOR Take 1 tablet (80 mg total) by mouth daily. What changed:  medication strength how much to take when to take this   clopidogrel 75 MG tablet Commonly known as: PLAVIX Take 1 tablet (75 mg total) by mouth daily.   ferrous sulfate 325 (65 FE) MG EC tablet Take 1 tablet (325 mg total) by mouth daily with breakfast. For one month then stop. If develops constipation, may take laxative or stop iron   levothyroxine 125 MCG tablet Commonly known as: SYNTHROID Take 1 tablet (125 mcg total) by mouth daily at 6 (six) AM.   metoprolol succinate 25 MG 24 hr tablet Commonly known as: TOPROL-XL Take 1 tablet (25 mg total) by mouth daily.   traMADol 50 MG tablet Commonly known as: ULTRAM Take 1 tablet (50 mg total) by mouth every 6 (six) hours as needed for moderate pain (pain score 4-6).        Follow-up Information     [facility]. Go on 04/26/2023.   Why: Please arrive by 12:00 pm in order to have PA/LAT CXR taken PRIOR to office appointmetn with [facility] PA Contact information: [address] [place] [address]        [facility] [place]. Go on 04/26/2023.   Specialty: Cardiothoracic Surgery Why: Appointment time is at 1:00 pm Contact information: [address] [place], [address] [place] [address] [phone]        Carlos Levering, NP. Go on 04/04/2023.   Specialty: Cardiology Why: Appointment time is at 3:10 pm Contact  information: [address] [place] [address] [phone]                 Signed:  Elenore Rota 03/30/2023, 9:40 AM

## 2023-03-23 NOTE — Anesthesia Procedure Notes (Signed)
Central Venous Catheter Insertion Performed by: Marcene Duos, MD, anesthesiologist Start/End12/02/2023 7:45 AM, 03/23/2023 7:55 AM Patient location: Pre-op. Preanesthetic checklist: patient identified, IV checked, site marked, risks and benefits discussed, surgical consent, monitors and equipment checked, pre-op evaluation, timeout performed and anesthesia consent Position: Trendelenburg Lidocaine 1% used for infiltration and patient sedated Hand hygiene performed , maximum sterile barriers used  and Seldinger technique used Catheter size: 8.5 Fr Total catheter length 10. Central line was placed.Sheath introducer Swan type:thermodilution PA Cath depth:50 Procedure performed using ultrasound guided technique. Ultrasound Notes:anatomy identified, needle tip was noted to be adjacent to the nerve/plexus identified, no ultrasound evidence of intravascular and/or intraneural injection and image(s) printed for medical record Attempts: 1 Following insertion, line sutured and dressing applied. Post procedure assessment: blood return through all ports, free fluid flow and no air  Patient tolerated the procedure well with no immediate complications.

## 2023-03-23 NOTE — Op Note (Signed)
[name], [name] MEDICAL RECORD NO: [id number] ACCOUNT NO: [id number] DATE OF BIRTH: September 28, 1960 FACILITY: [facility] LOCATION: [facility] PHYSICIAN: Salvatore Decent. Dorris Fetch, MD  Operative Report   DATE OF PROCEDURE: 03/23/2023  PREOPERATIVE DIAGNOSIS: Left main and three-vessel coronary artery disease.  POSTOPERATIVE DIAGNOSIS: Left main and three-vessel coronary artery disease.  PROCEDURES PERFORMED: Median sternotomy, extracorporeal circulation,  Coronary artery bypass grafting x 3  Left internal mammary artery to LAD,  Right internal mammary artery to distal right coronary,  Saphenous vein graft to obtuse marginal. Endoscopic vein harvest right thigh  SURGEON: Viviann Spare C. Dorris Fetch, MD  ASSISTANT:  Darnell Level, MD.  SECOND ASSISTANT:  Jillyn Hidden, [place].  ANESTHESIA: General.  FINDINGS: Transesophageal echocardiography showed preserved left ventricular wall motion with mild AI, unchanged post bypass.  Distal right graft just beyond plaque.  Good quality conduits, good quality targets.  CLINICAL NOTE:  [name] is a 62 year old male with multiple cardiac risk factors who had a coronary CT which showed a severely elevated coronary calcium score.  He underwent cardiac catheterization which showed ostial left main and right coronary stenoses, both of which were significant by FFR.  He was advised to undergo coronary artery bypass grafting.  The indications, risks, benefits, and alternatives were discussed in detail with the patient.  He understood and accepted the risks and agreed  to proceed.  OPERATIVE NOTE: [name] was brought to the preoperative holding area on 03/23/2023.  There, the anesthesia service placed a Swan-Ganz catheter and an arterial blood pressure monitoring line under the direction of Dr. Marcene Duos.  He was taken to the operating room, anesthetized, and intubated.  A Foley catheter was placed.  Intravenous antibiotics were administered.   The chest, abdomen, and legs were prepped and draped in the usual sterile fashion.  Transesophageal echocardiography was performed by Dr. Marcene Duos.  Please see his separately dictated note for full details of the procedure.  Findings were as noted above.  A timeout was performed. An incision was made in the medial aspect of the right leg at the level of the knee. The greater saphenous vein was harvested from the right thigh endoscopically.  The saphenous vein was of good quality.  That was performed by Jillyn Hidden, PA.  Simultaneously, a median sternotomy was performed and the left internal mammary artery was harvested using standard technique.  2000 units of heparin was administered during the vessel harvest.  The mammary artery was a good  quality vessel with good flow when divided distally.  The right internal mammary artery then was harvested again using standard technique.  It was also a good quality vessel with excellent flow when divided distally.   After harvesting the conduits, the pericardium was opened.  The remainder of full heparin dose was given.  After confirming adequate anticoagulation with ACT measurement, the aorta was cannulated via concentric 2-0 Ethibond pledgeted pursestring sutures.  A dual-stage venous cannula was placed via a pursestring suture in the right atrial appendage. Cardiopulmonary bypass was initiated.  Flows were maintained per protocol.  The patient was cooled at 32 degrees Celsius.  The coronary arteries were inspected and anastomotic sites were chosen.  The conduits were inspected and cut to length.  A foam pad was placed in the pericardium to insulate the heart.  A temperature probe was placed in the myocardial septum, and a cardioplegia cannula was placed in the  ascending aorta.   The aorta was cross-clamped.  The left ventricle was emptied via the aortic root vent.  Cardiac arrest then was achieved with a combination of cold antegrade blood  cardioplegia and topical ice saline.  Approximately 2 liters of cardioplegia was administered.  With initial cardioplegia administration, there was some left ventricular distention.  There was a rapid diastolic arrest, but septal cooling was slow.  It ultimately cooled to 13 degrees Celsius.   A reversed saphenous vein graft was placed end to side to obtuse marginal.  This was a 1.5 mm good quality target.  The vein was of good quality.  The end-to-side anastomosis was performed with a running 8-0 Prolene suture.  All anastomoses were probed proximally and distally at their completion to ensure patency.  Cardioplegia was administered down the vein and there was good flow and good hemostasis.   The distal end of the right mammary artery was beveled.  It was anastomosed end to side to the distal right coronary.  The distal right coronary had a large plaque.  It was grafted just distal to that.  It was a good quality vessel at the site of the anastomosis.  The end-to-side anastomosis was performed with a running 8-0 Prolene suture.  At the completion of the anastomosis, a probe passed easily.  The Bulldog clamp was briefly removed and there was good hemostasis.  The Bulldog clamp was  replaced.   Additional cardioplegia was administered.  Next, the left internal mammary artery was brought through a window in the pericardium.  The distal end was beveled.  It was anastomosed end to side to the distal LAD.  The LAD and mammary artery were both 1.5 mm good quality vessels.  At the completion of the mammary to LAD anastomosis, the Bulldog clamp was briefly removed to inspect for hemostasis.  Rapid septal rewarming was noted.  The Bulldog clamp was replaced and the mammary pedicle was tacked to the epicardial surface of the heart with 6-0 Prolene sutures.   Additional cardioplegia was administered down the vein graft.  It was cut to length.  The cardioplegia cannula was removed from the ascending aorta.  The  proximal vein graft anastomosis was performed to a 4.5-mm punch aortotomy with a running 6-0 Prolene suture.  At the completion of the final proximal anastomosis, the patient was placed in Trendelenburg position.  Lidocaine was administered.  The aortic root was deaired and the aortic cross clamp was removed.  The total cross clamp time was 86 minutes.  The patient required several defibrillation attempts before he finally converted to sinus rhythm, but did not have any other arrhythmias thereafter.  While rewarming was completed, all proximal and distal anastomosis were inspected for hemostasis.  Epicardial pacing wires were placed on the right ventricle and right atrium.  When the patient had rewarmed to a core temperature of 37 degrees Celsius.  He was weaned from cardiopulmonary bypass on the first attempt.  He was A-paced at 80 beats per minute. He did not require inotropic support. The total bypass time was 134 minutes.   Post bypass transesophageal echocardiography was unchanged from the pre-bypass study.  There was preserved left ventricular wall motion and good overall cardiac function.  The test dose of protamine was administered and was well tolerated.  The atrial and aortic cannulae were removed.  The remainder of the protamine was administered without incident.  The chest was irrigated with warm saline.  Hemostasis was achieved.  The pericardium was reapproximated over the ascending aorta with interrupted 3-0 silk sutures.  Bilateral pleural tubes and a single mediastinal tube were  placed through separate subcostal incisions and secured with #1 silk sutures.  The sternum was closed with a combination of single and double-heavy gauge stainless steel wires.  Pectoralis fascia, subcutaneous tissue, and skin were closed in a standard fashion.  All sponge, needle, and instrument counts were correct at the end of the procedure.  The patient was taken from the operating room to the surgical intensive care  unit, intubated and in good condition.   Experience assistance process for this case due to the surgical complexity.  Jillyn Hidden independently harvesting the saphenous vein and close leg incision.  Alfonso Patten, MD assisted with sternotomy, mammary artery harvest, exposure, retraction of delicate tissues, suctioning, and suture management during the anastomoses and wound closure.  I was present for the entire procedure and performed the critical portions of the case.  MUK D: 03/23/2023 5:54:43 pm T: 03/23/2023 11:48:00 pm  JOB: [id number]/ [id number]

## 2023-03-23 NOTE — Transfer of Care (Signed)
Immediate Anesthesia Transfer of Care Note  Patient: [name]  Procedure(s) Performed: CORONARY ARTERY BYPASS GRAFTING (CABG), UTILIZING BILATERAL INTERNAL MAMMARY ARTERIES AND ENDOSCOPIC VEIN HARVEST RIGHT GREATER SAPHENOUS VEIN. (Chest) TRANSESOPHAGEAL ECHOCARDIOGRAM (TEE)  Patient Location: ICU  Anesthesia Type:General  Level of Consciousness: sedated and Patient remains intubated per anesthesia plan  Airway & Oxygen Therapy: Patient remains intubated per anesthesia plan and Patient placed on Ventilator (see vital sign flow sheet for setting)  Post-op Assessment: Report given to RN and Post -op Vital signs reviewed and stable  Post vital signs: Reviewed and stable  Last Vitals:  Vitals Value Taken Time  BP 100/63 1534  Temp    Pulse 79 03/23/23 1526  Resp 22 03/23/23 1529  SpO2 95 % 03/23/23 1526  Vitals shown include unfiled device data.  Last Pain:  Vitals:   03/23/23 0726  TempSrc: Oral  PainSc: 0-No pain         Complications: No notable events documented.

## 2023-03-23 NOTE — Brief Op Note (Addendum)
03/22/2023 - 03/23/2023  10:38 AM  PATIENT:  [name]  62 y.o. male  PRE-OPERATIVE DIAGNOSIS:  LEFT MAIN AND MULTIVESSEL CORONARY ARTERY DISEASE, STABLE ANGINA PECTORIS  POST-OPERATIVE DIAGNOSIS:  LEFT MAIN AND MULTIVESSEL CORONARY ARTERY DISEASE, STABLE ANGINA PECTORIS  PROCEDURE:   CORONARY ARTERY BYPASS GRAFTING (CABG) X 3 with bilateral internal mammary arterial harvest and RLE endoscopic vein harvest  LIMA-LAD  RIMA-RCA  SVG-OM  Vein harvest time: Vein prep time:  TRANSESOPHAGEAL ECHOCARDIOGRAM  SURGEON: Loreli Slot, MD  ASSISTANTS: Dr. Ave Filter, RN, Scrub Person  Tanda Rockers, RN, RN First Assistant   PHYSICIAN ASSISTANT: Jillyn Hidden, PA-C  ANESTHESIA:   general  EBL:  ,  Cell Saver blood returned to patient  BLOOD ADMINISTERED:none  DRAINS:  bilateral and mediastinal drains    LOCAL MEDICATIONS USED:  NONE  COUNTS:  Correct  DICTATION: .Dragon Dictation  PLAN OF CARE: Admit to inpatient   PATIENT DISPOSITION:  ICU - intubated and hemodynamically stable.   Delay start of Pharmacological VTE agent (>24hrs) due to surgical blood loss or risk of bleeding: yes

## 2023-03-23 NOTE — Anesthesia Procedure Notes (Signed)
Arterial Line Insertion Start/End12/02/2023 7:40 AM, 03/23/2023 7:45 AM Performed by: Marcene Duos, MD, Yolonda Kida, CRNA, CRNA  Patient location: Pre-op. Preanesthetic checklist: patient identified, IV checked, site marked, risks and benefits discussed, surgical consent, monitors and equipment checked, pre-op evaluation, timeout performed and anesthesia consent Lidocaine 1% used for infiltration Left, radial was placed Catheter size: 20 G Hand hygiene performed  and maximum sterile barriers used   Attempts: 1 Procedure performed without using ultrasound guided technique. Following insertion, dressing applied and Biopatch. Post procedure assessment: normal  Patient tolerated the procedure well with no immediate complications.

## 2023-03-23 NOTE — Interval H&P Note (Signed)
History and Physical Interval Note:  03/23/2023 7:32 AM  [name]  has presented today for surgery, with the diagnosis of CAD.  The various methods of treatment have been discussed with the patient and family. After consideration of risks, benefits and other options for treatment, the patient has consented to  Procedure(s): CORONARY ARTERY BYPASS GRAFTING (CABG), possible bilateral IMA (N/A) possible RADIAL ARTERY HARVEST (Left) TRANSESOPHAGEAL ECHOCARDIOGRAM (TEE) (N/A) as a surgical intervention.  The patient's history has been reviewed, patient examined, no change in status, stable for surgery.  I have reviewed the patient's chart and labs.  Questions were answered to the patient's satisfaction.     Loreli Slot

## 2023-03-24 ENCOUNTER — Inpatient Hospital Stay (HOSPITAL_COMMUNITY): Payer: Commercial Managed Care - PPO

## 2023-03-24 ENCOUNTER — Other Ambulatory Visit: Payer: Self-pay

## 2023-03-24 ENCOUNTER — Encounter (HOSPITAL_COMMUNITY): Payer: Self-pay | Admitting: Thoracic Surgery (Cardiothoracic Vascular Surgery)

## 2023-03-24 DIAGNOSIS — E039 Hypothyroidism, unspecified: Secondary | ICD-10-CM

## 2023-03-24 DIAGNOSIS — J9601 Acute respiratory failure with hypoxia: Secondary | ICD-10-CM | POA: Diagnosis not present

## 2023-03-24 DIAGNOSIS — N179 Acute kidney failure, unspecified: Secondary | ICD-10-CM

## 2023-03-24 LAB — CBC
HCT: 22.4 % — ABNORMAL LOW (ref 39.0–52.0)
HCT: 22.1 % — ABNORMAL LOW (ref 39.0–52.0)
Hemoglobin: 7.6 g/dL — ABNORMAL LOW (ref 13.0–17.0)
Hemoglobin: 7.6 g/dL — ABNORMAL LOW (ref 13.0–17.0)
MCH: 33 pg (ref 26.0–34.0)
MCH: 33.5 pg (ref 26.0–34.0)
MCHC: 33.9 g/dL (ref 30.0–36.0)
MCHC: 34.4 g/dL (ref 30.0–36.0)
MCV: 97.4 fL (ref 80.0–100.0)
MCV: 97.4 fL (ref 80.0–100.0)
Platelets: 128 10*3/uL — ABNORMAL LOW (ref 150–400)
Platelets: 135 10*3/uL — ABNORMAL LOW (ref 150–400)
RBC: 2.3 MIL/uL — ABNORMAL LOW (ref 4.22–5.81)
RBC: 2.27 MIL/uL — ABNORMAL LOW (ref 4.22–5.81)
RDW: 13.5 % (ref 11.5–15.5)
RDW: 13.8 % (ref 11.5–15.5)
WBC: 13.3 10*3/uL — ABNORMAL HIGH (ref 4.0–10.5)
WBC: 21.3 10*3/uL — ABNORMAL HIGH (ref 4.0–10.5)
nRBC: 0 % (ref 0.0–0.2)
nRBC: 0 % (ref 0.0–0.2)

## 2023-03-24 LAB — GLUCOSE, CAPILLARY
Glucose-Capillary: 126 mg/dL — ABNORMAL HIGH (ref 70–99)
Glucose-Capillary: 127 mg/dL — ABNORMAL HIGH (ref 70–99)
Glucose-Capillary: 127 mg/dL — ABNORMAL HIGH (ref 70–99)
Glucose-Capillary: 129 mg/dL — ABNORMAL HIGH (ref 70–99)
Glucose-Capillary: 129 mg/dL — ABNORMAL HIGH (ref 70–99)
Glucose-Capillary: 135 mg/dL — ABNORMAL HIGH (ref 70–99)
Glucose-Capillary: 137 mg/dL — ABNORMAL HIGH (ref 70–99)
Glucose-Capillary: 141 mg/dL — ABNORMAL HIGH (ref 70–99)
Glucose-Capillary: 163 mg/dL — ABNORMAL HIGH (ref 70–99)

## 2023-03-24 LAB — BASIC METABOLIC PANEL
Anion gap: 6 (ref 5–15)
Anion gap: 9 (ref 5–15)
BUN: 26 mg/dL — ABNORMAL HIGH (ref 8–23)
BUN: 34 mg/dL — ABNORMAL HIGH (ref 8–23)
CO2: 21 mmol/L — ABNORMAL LOW (ref 22–32)
CO2: 20 mmol/L — ABNORMAL LOW (ref 22–32)
Calcium: 7.9 mg/dL — ABNORMAL LOW (ref 8.9–10.3)
Calcium: 8 mg/dL — ABNORMAL LOW (ref 8.9–10.3)
Chloride: 114 mmol/L — ABNORMAL HIGH (ref 98–111)
Chloride: 108 mmol/L (ref 98–111)
Creatinine, Ser: 1.55 mg/dL — ABNORMAL HIGH (ref 0.61–1.24)
Creatinine, Ser: 1.78 mg/dL — ABNORMAL HIGH (ref 0.61–1.24)
GFR, Estimated: 51 mL/min — ABNORMAL LOW (ref >60)
GFR, Estimated: 43 mL/min — ABNORMAL LOW (ref >60)
Glucose, Bld: 129 mg/dL — ABNORMAL HIGH (ref 70–99)
Glucose, Bld: 153 mg/dL — ABNORMAL HIGH (ref 70–99)
Potassium: 4.6 mmol/L (ref 3.5–5.1)
Potassium: 4.7 mmol/L (ref 3.5–5.1)
Sodium: 141 mmol/L (ref 135–145)
Sodium: 137 mmol/L (ref 135–145)

## 2023-03-24 LAB — COOXEMETRY PANEL
Carboxyhemoglobin: 1.3 % (ref 0.5–1.5)
Methemoglobin: 1.4 % (ref 0.0–1.5)
O2 Saturation: 65.6 %
Total hemoglobin: 7.9 g/dL — ABNORMAL LOW (ref 12.0–16.0)

## 2023-03-24 LAB — MAGNESIUM
Magnesium: 3.6 mg/dL — ABNORMAL HIGH (ref 1.7–2.4)
Magnesium: 3.3 mg/dL — ABNORMAL HIGH (ref 1.7–2.4)

## 2023-03-24 MED ORDER — FUROSEMIDE 10 MG/ML IJ SOLN
40.0000 mg | Freq: Once | INTRAMUSCULAR | Status: AC
  Administered 2023-03-24: 40 mg via INTRAVENOUS
  Filled 2023-03-24: qty 4

## 2023-03-24 MED ORDER — INSULIN ASPART 100 UNIT/ML IJ SOLN
0.0000 [IU] | INTRAMUSCULAR | Status: DC
  Administered 2023-03-24: 2 [IU] via SUBCUTANEOUS
  Administered 2023-03-24: 4 [IU] via SUBCUTANEOUS
  Administered 2023-03-25: 2 [IU] via SUBCUTANEOUS

## 2023-03-24 MED ORDER — IPRATROPIUM-ALBUTEROL 0.5-2.5 (3) MG/3ML IN SOLN: 3.0000 mL | RESPIRATORY_TRACT | Status: DC | PRN

## 2023-03-24 MED ORDER — ENOXAPARIN SODIUM 40 MG/0.4ML IJ SOSY
40.0000 mg | PREFILLED_SYRINGE | Freq: Every day | INTRAMUSCULAR | Status: DC
  Administered 2023-03-24: 40 mg via SUBCUTANEOUS
  Filled 2023-03-24: qty 0.4

## 2023-03-24 MED ORDER — INSULIN DETEMIR 100 UNIT/ML ~~LOC~~ SOLN
10.0000 [IU] | Freq: Two times a day (BID) | SUBCUTANEOUS | Status: DC
Start: 2023-03-24 — End: 2023-03-25
  Administered 2023-03-24 (×2): 10 [IU] via SUBCUTANEOUS
  Filled 2023-03-24 (×4): qty 0.1

## 2023-03-24 NOTE — Progress Notes (Signed)
RT called to bedside due to the pt sats being in the 80s, despite being on HFNC (salter) 15L. RT placed pt on heated HFNC 40L/100% and sats are now 92%. Pt tolerating it well at this time and is in no apparent distress. MD notified.

## 2023-03-24 NOTE — Progress Notes (Addendum)
Covering for [place] for lunch, pt on 15 L [place], called RT to place pt on heated high flow.  Paged Dr. Arnold Long called back, pt to be placed on Heated high flow, also requested that CCM come to see pt.

## 2023-03-24 NOTE — Consult Note (Signed)
NAME:  [name], MRN:  [id number], DOB:  25-May-1960, LOS: 2 ADMISSION DATE:  03/22/2023, CONSULTATION DATE:  12/12 REFERRING MD:  Dr. Dorris Fetch, CHIEF COMPLAINT:  CABG x3; acute respiratory failure w/ hypoxia   History of Present Illness:  Patient is a 62 yo M w/ pertinent PMH CAD, carotid stenosis s/p right carotid stent in 2022, TIA, tobacco abuse, HLD, HTN, hypothyroidism presents to [facility] on 12/10 w/ chest pain.  Patient's chest pain ongoing for past 2 months worse w/ exertion. Having some radiation to left arm and neck. Seen by cardiology outpt. Coronary CTA w/ severe proximal LM stenosis, moderate proximal Lcx stenosis. Was sent to ED on 12/6 w/ persistent unstable angina. Trop negative. Left heart cath showing severe ostial left main 70% stenosis. TCTS consulted w/ plan for CABG on 12/11. Discharged on 12/9. 12/11 came to [facility] for CABG x3. Post op intubated/sedated. Extubated later that day.  On 12/12 patient with increasing o2 requirements. Sats mid 80s on 15L [place] HFNC. PCCM consulted.   Pertinent  Medical History   Past Medical History:  Diagnosis Date   Arthritis    Carotid artery disease (HCC)    seen by [facility]   Coronary artery disease    Hx of transient ischemic attack (TIA)    Hyperlipidemia    Hypertension    Thyroid disease    hypothyrodism     Significant Hospital Events: Including procedures, antibiotic start and stop dates in addition to other pertinent events   12/11 cabg x3 12/12 increasing o2 requirements; pccm consulted  Interim History / Subjective:  On 15L [place] hfnc sats 88% Patient not in any acute distress Some mild chest pain Pulling 200-400 on IS  Objective   Blood pressure (!) 112/56, pulse 80, temperature (!) 96.6 F (35.9 C), resp. rate 17, height 5\' 6"  (1.676 m), weight 82 kg, SpO2 (!) 88%. PAP: (21-62)/(9-37) 24/13 CO:  [3.9 L/min-6.6 L/min] 6.5 L/min CI:  [2.1 L/min/m2-3.5 L/min/m2] 3.5 L/min/m2  Vent  Mode: CPAP;PSV FiO2 (%):  [40 %-50 %] 40 % Set Rate:  [4 bmp-16 bmp] 4 bmp Vt Set:  [510 mL] 510 mL PEEP:  [5 cmH20] 5 cmH20 Pressure Support:  [10 cmH20] 10 cmH20 Plateau Pressure:  [17 cmH20] 17 cmH20   Intake/Output Summary (Last 24 hours) at 03/24/2023 1558 Last data filed at 03/24/2023 1400 Gross per 24 hour  Intake 3514.97 ml  Output 2184 ml  Net 1330.97 ml   Filed Weights   03/22/23 1529 03/23/23 0448 03/24/23 0500  Weight: 78 kg 77.2 kg 82 kg    Examination: General:  NAD HEENT: MM pink/moist; Wing in place Neuro: Aox3; MAE CV: s1s2, paced rate 80s, pleural rub appreciated PULM:  pleural rub BS bilaterally; on 15 [place] sats 88% GI: soft, bsx4 active  Extremities: warm/dry, no edema  Skin: no rashes or lesions    Resolved Hospital Problem list     Assessment & Plan:  Acute respiratory failure w/ hypoxia Plan: -will place on bipap; wean for sats >92% -can wean to heated HFNC if improving -check CXR -uop only 220 today; lasix given; monitor UOP -aggressive pulm toiletry: IS/Flutter -PT/OT/OOB  S/p 3-vessel CABG CAD HTN HLD Plan: - Postoperative care per TCTS - CT management per protocol - cont ancef/vanc for surgical ppx - ASA and statin - pain management  AKI Plan: -Trend BMP / urinary output -Replace electrolytes as indicated -Avoid nephrotoxic agents, ensure adequate renal perfusion  Hypothyroidism Plan: -synthroid   Best  Practice (right click and "Reselect all SmartList Selections" daily)   Diet/type: clear liquids DVT prophylaxis: LMWH GI prophylaxis: PPI Lines: N/A Foley:  Yes, and it is still needed Code Status:  full code Last date of multidisciplinary goals of care discussion [per primary]   Labs   CBC: Recent Labs  Lab 03/18/23 1600 03/22/23 0324 03/23/23 0249 03/23/23 0905 03/23/23 1314 03/23/23 1332 03/23/23 1546 03/23/23 1807 03/23/23 2105 03/23/23 2208 03/24/23 0420  WBC 11.4* 9.1 8.7  --   --   --  13.6*  --    --  12.0* 13.3*  NEUTROABS 6.6  --  4.3  --   --   --   --   --   --   --   --   HGB 12.2* 11.6* 11.2*   < > 7.5*   < > 8.4* 7.1* 7.5* 8.1* 7.6*  HCT 36.2* 33.0* 32.4*   < > 21.9*   < > 25.1* 21.0* 22.0* 23.6* 22.4*  MCV 96.5 94.0 93.4  --   --   --  96.9  --   --  96.7 97.4  PLT 275 235 231  --  148*  --  125*  --   --  137* 128*   < > = values in this interval not displayed.    Basic Metabolic Panel: Recent Labs  Lab 03/18/23 1932 03/22/23 1208 03/22/23 2348 03/23/23 0249 03/23/23 0905 03/23/23 1224 03/23/23 1251 03/23/23 1313 03/23/23 1332 03/23/23 1415 03/23/23 1537 03/23/23 1807 03/23/23 2105 03/23/23 2208 03/24/23 0420  NA  --  137 141 139   < > 141   < > 139   < > 141 143 145 142 140 141  K  --  4.1 3.9 4.1   < > 5.7*   < > 6.4*   < > 5.5* 5.2* 5.7* 4.5 4.4 4.6  CL  --  109 111 113*   < > 110  --  111  --  111  --   --   --  113* 114*  CO2  --  20* 21* 19*  --   --   --   --   --   --   --   --   --  22 21*  GLUCOSE  --  113* 90 92   < > 127*  --  144*  --  120*  --   --   --  155* 129*  BUN  --  26* 25* 24*   < > 21  --  22  --  22  --   --   --  23 26*  CREATININE  --  1.40* 1.53* 1.50*   < > 1.40*  --  1.30*  --  1.30*  --   --   --  1.44* 1.55*  CALCIUM  --  9.0 9.1 9.0  --   --   --   --   --   --   --   --   --  7.7* 7.9*  MG 2.3  --   --   --   --   --   --   --   --   --   --   --   --  3.7* 3.6*   < > = values in this interval not displayed.   GFR: Estimated Creatinine Clearance: 50.3 mL/min (A) (by C-G formula based on SCr of 1.55 mg/dL (H)). Recent Labs  Lab 03/23/23 0249  03/23/23 1546 03/23/23 2208 03/24/23 0420  WBC 8.7 13.6* 12.0* 13.3*    Liver Function Tests: No results for input(s): "AST", "ALT", "ALKPHOS", "BILITOT", "PROT", "ALBUMIN" in the last 168 hours. No results for input(s): "LIPASE", "AMYLASE" in the last 168 hours. No results for input(s): "AMMONIA" in the last 168 hours.  ABG    Component Value Date/Time   PHART 7.320 (L)  03/23/2023 2105   PCO2ART 38.6 03/23/2023 2105   PO2ART 55 (L) 03/23/2023 2105   HCO3 20.1 03/23/2023 2105   TCO2 21 (L) 03/23/2023 2105   ACIDBASEDEF 6.0 (H) 03/23/2023 2105   O2SAT 87 03/23/2023 2105     Coagulation Profile: Recent Labs  Lab 03/21/23 1533 03/23/23 1546  INR 1.1 1.4*    Cardiac Enzymes: No results for input(s): "CKTOTAL", "CKMB", "CKMBINDEX", "TROPONINI" in the last 168 hours.  HbA1C: Hgb A1c MFr Bld  Date/Time Value Ref Range Status  03/18/2023 04:00 PM 5.5 4.8 - 5.6 % Final    Comment:    (NOTE) Pre diabetes:          5.7%-6.4%  Diabetes:              >6.4%  Glycemic control for   <7.0% adults with diabetes     CBG: Recent Labs  Lab 03/24/23 0322 03/24/23 0426 03/24/23 0646 03/24/23 0908 03/24/23 1133  GLUCAP 126* 127* 135* 137* 129*    Review of Systems:   Review of Systems  Constitutional:  Negative for fever.  Respiratory:  Negative for cough and shortness of breath.   Cardiovascular:  Positive for chest pain.  Gastrointestinal:  Negative for abdominal pain, nausea and vomiting.     Past Medical History:  He,  has a past medical history of Arthritis, Carotid artery disease (HCC), Coronary artery disease, transient ischemic attack (TIA), Hyperlipidemia, Hypertension, and Thyroid disease.   Surgical History:   Past Surgical History:  Procedure Laterality Date   BACK SURGERY     CAROTID PTA/STENT INTERVENTION Right 10/22/2020   Procedure: CAROTID PTA/STENT INTERVENTION;  Surgeon: Renford Dills, MD;  Location: [facility] INVASIVE CV LAB;  Service: Cardiovascular;  Laterality: Right;   CAROTID PTA/STENT INTERVENTION Left 12/17/2020   Procedure: CAROTID PTA/STENT INTERVENTION;  Surgeon: Renford Dills, MD;  Location: [facility] INVASIVE CV LAB;  Service: Cardiovascular;  Laterality: Left;   CATARACT EXTRACTION W/PHACO Right 08/18/2022   Procedure: CATARACT EXTRACTION PHACO AND INTRAOCULAR LENS PLACEMENT (IOC) RIGHT MALYUGIN 4.19 00:24.5;   Surgeon: Lockie Mola, MD;  Location: [facility];  Service: Ophthalmology;  Laterality: Right;   CORONARY ARTERY BYPASS GRAFT N/A 03/23/2023   Procedure: CORONARY ARTERY BYPASS GRAFTING (CABG), UTILIZING BILATERAL INTERNAL MAMMARY ARTERIES AND ENDOSCOPIC VEIN HARVEST RIGHT GREATER SAPHENOUS VEIN.;  Surgeon: Loreli Slot, MD;  Location: [facility] OR;  Service: Open Heart Surgery;  Laterality: N/A;   LEFT HEART CATH AND CORONARY ANGIOGRAPHY N/A 03/21/2023   Procedure: LEFT HEART CATH AND CORONARY ANGIOGRAPHY;  Surgeon: [name], MD;  Location: [facility] INVASIVE CV LAB;  Service: Cardiovascular;  Laterality: N/A;   TEE WITHOUT CARDIOVERSION N/A 03/23/2023   Procedure: TRANSESOPHAGEAL ECHOCARDIOGRAM (TEE);  Surgeon: Loreli Slot, MD;  Location: [facility] OR;  Service: Open Heart Surgery;  Laterality: N/A;     Social History:   reports that he has been smoking cigarettes. He has a 47 pack-year smoking history. He has never used smokeless tobacco. He reports that he does not drink alcohol and does not use drugs.   Family History:  His family  history includes Cancer in his father; Diabetes in his mother; Heart attack in his mother; Heart disease in his brother and sister; Hypothyroidism in his brother, mother, and sister.   Allergies No Known Allergies   Home Medications  Prior to Admission medications   Medication Sig Start Date End Date Taking? Authorizing Provider  acetaminophen (TYLENOL) 500 MG tablet Take 1,000 mg by mouth every 6 (six) hours as needed for mild pain. 11/12/20  Yes [provider]  amLODipine (NORVASC) 5 MG tablet Take 5 mg by mouth daily. 01/04/23  Yes [provider]  aspirin EC 81 MG EC tablet Take 1 tablet (81 mg total) by mouth at bedtime. Swallow whole. 12/18/20  Yes Schnier, Latina Craver, MD  atorvastatin (LIPITOR) 40 MG tablet Take 40 mg by mouth at bedtime. 08/07/20  Yes [provider]  hydrochlorothiazide (HYDRODIURIL) 25 MG  tablet Take 25 mg by mouth daily.   Yes [provider]  levothyroxine (SYNTHROID) 125 MCG tablet Take 1 tablet (125 mcg total) by mouth daily at 6 (six) AM. 03/22/23  Yes Leeroy Bock, MD  nitroGLYCERIN (NITROSTAT) 0.4 MG SL tablet Place 1 tablet (0.4 mg total) under the tongue every 5 (five) minutes as needed for chest pain. 02/25/23 05/26/23 Yes Agbor-Etang, Arlys Jamella Grayer, MD  omeprazole (PRILOSEC) 20 MG capsule Take 20 mg by mouth daily.   Yes [provider]  metoprolol tartrate (LOPRESSOR) 100 MG tablet Take 1 tablet (100 mg total) by mouth once for 1 dose. TAKE TWO HOURS PRIOR TO CARDIAC CTA 02/25/23 02/25/23  Debbe Odea, MD     Critical care time: 45 minutes     JD Anselm [facility] 03/24/2023, 3:58 PM  Please see [email or web address] for pager details.  From 7A-7P if no response, please call [phone]. After hours, please call ELink [phone].

## 2023-03-24 NOTE — Progress Notes (Signed)
   03/24/23 2259  BiPAP/CPAP/SIPAP  BiPAP/CPAP/SIPAP Pt Type Adult  BiPAP/CPAP/SIPAP Resmed  Mask Type Full face mask  Mask Size Large  IPAP 12 cmH20  EPAP 6 cmH2O  Flow Rate 10 lpm  Patient Home Equipment No  Auto Titrate No  BiPAP/CPAP /SiPAP Vitals  Pulse Rate 80  Resp 16  SpO2 92 %  Bilateral Breath Sounds Clear;Diminished  MEWS Score/Color  MEWS Score 1  MEWS Score Color Green

## 2023-03-24 NOTE — Progress Notes (Signed)
      [address]       [place] [address]             [phone]       C/o pain with deep breath, not moving much air with incentive  BP (!) 98/51   Pulse 74   Temp (!) 96.6 F (35.9 C)   Resp (!) 24   Ht 5\' 6"  (1.676 m)   Wt 82 kg   SpO2 97%   BMI 29.18 kg/m   HFNC 97% sat  CXR shows bibasilar atelectasis  Lasix 40 mg IV givent  Co-ox 65 Creatinine 1.78, K 4.7 Hgb 7.6 stable WBC 21 K- up from 13K  With elevated WBC may have a component of SIRS Will plan BIPAP overnight  Viviann Spare C. Dorris Fetch, MD [facility] [phone]

## 2023-03-24 NOTE — Progress Notes (Signed)
RT attempted ABG x 2 without success. MD made aware.

## 2023-03-24 NOTE — Anesthesia Postprocedure Evaluation (Signed)
Anesthesia Post Note  Patient: [name]  Procedure(s) Performed: CORONARY ARTERY BYPASS GRAFTING (CABG), UTILIZING BILATERAL INTERNAL MAMMARY ARTERIES AND ENDOSCOPIC VEIN HARVEST RIGHT GREATER SAPHENOUS VEIN. (Chest) TRANSESOPHAGEAL ECHOCARDIOGRAM (TEE)     Patient location during evaluation: SICU Anesthesia Type: General Level of consciousness: sedated Pain management: pain level controlled Vital Signs Assessment: post-procedure vital signs reviewed and stable Respiratory status: patient remains intubated per anesthesia plan Cardiovascular status: stable Postop Assessment: no apparent nausea or vomiting Anesthetic complications: no   No notable events documented.  Last Vitals:  Vitals:   03/24/23 0945 03/24/23 1000  BP: (!) 98/58 (!) 122/55  Pulse: 80 80  Resp: (!) 27 (!) 26  Temp:    SpO2: (!) 88% 92%    Last Pain:  Vitals:   03/24/23 0940  TempSrc:   PainSc: 7                  Kennieth Rad

## 2023-03-25 ENCOUNTER — Inpatient Hospital Stay (HOSPITAL_COMMUNITY): Payer: Commercial Managed Care - PPO

## 2023-03-25 DIAGNOSIS — I2585 Chronic coronary microvascular dysfunction: Secondary | ICD-10-CM

## 2023-03-25 LAB — BASIC METABOLIC PANEL
Anion gap: 9 (ref 5–15)
BUN: 45 mg/dL — ABNORMAL HIGH (ref 8–23)
CO2: 20 mmol/L — ABNORMAL LOW (ref 22–32)
Calcium: 8.1 mg/dL — ABNORMAL LOW (ref 8.9–10.3)
Chloride: 108 mmol/L (ref 98–111)
Creatinine, Ser: 2.06 mg/dL — ABNORMAL HIGH (ref 0.61–1.24)
GFR, Estimated: 36 mL/min — ABNORMAL LOW (ref >60)
Glucose, Bld: 115 mg/dL — ABNORMAL HIGH (ref 70–99)
Potassium: 4.6 mmol/L (ref 3.5–5.1)
Sodium: 137 mmol/L (ref 135–145)

## 2023-03-25 LAB — CBC
HCT: 21 % — ABNORMAL LOW (ref 39.0–52.0)
Hemoglobin: 7 g/dL — ABNORMAL LOW (ref 13.0–17.0)
MCH: 33 pg (ref 26.0–34.0)
MCHC: 33.3 g/dL (ref 30.0–36.0)
MCV: 99.1 fL (ref 80.0–100.0)
Platelets: 135 10*3/uL — ABNORMAL LOW (ref 150–400)
RBC: 2.12 MIL/uL — ABNORMAL LOW (ref 4.22–5.81)
RDW: 13.9 % (ref 11.5–15.5)
WBC: 22 10*3/uL — ABNORMAL HIGH (ref 4.0–10.5)
nRBC: 0 % (ref 0.0–0.2)

## 2023-03-25 LAB — GLUCOSE, CAPILLARY
Glucose-Capillary: 105 mg/dL — ABNORMAL HIGH (ref 70–99)
Glucose-Capillary: 107 mg/dL — ABNORMAL HIGH (ref 70–99)
Glucose-Capillary: 121 mg/dL — ABNORMAL HIGH (ref 70–99)
Glucose-Capillary: 121 mg/dL — ABNORMAL HIGH (ref 70–99)
Glucose-Capillary: 125 mg/dL — ABNORMAL HIGH (ref 70–99)
Glucose-Capillary: 131 mg/dL — ABNORMAL HIGH (ref 70–99)

## 2023-03-25 LAB — COOXEMETRY PANEL
Carboxyhemoglobin: 1.2 % (ref 0.5–1.5)
Methemoglobin: <0.7 % (ref 0.0–1.5)
O2 Saturation: 59.3 %
Total hemoglobin: 6.9 g/dL — CL (ref 12.0–16.0)

## 2023-03-25 LAB — PREPARE RBC (CROSSMATCH)

## 2023-03-25 MED ORDER — NOREPINEPHRINE 4 MG/250ML-% IV SOLN
0.0000 ug/min | INTRAVENOUS | Status: DC
  Administered 2023-03-25: 2 ug/min via INTRAVENOUS
  Filled 2023-03-25: qty 250

## 2023-03-25 MED ORDER — INSULIN ASPART 100 UNIT/ML IJ SOLN
0.0000 [IU] | Freq: Three times a day (TID) | INTRAMUSCULAR | Status: DC
  Administered 2023-03-25 – 2023-03-26 (×2): 2 [IU] via SUBCUTANEOUS

## 2023-03-25 MED ORDER — ENOXAPARIN SODIUM 30 MG/0.3ML IJ SOSY
30.0000 mg | PREFILLED_SYRINGE | INTRAMUSCULAR | Status: DC
  Administered 2023-03-25 – 2023-03-29 (×5): 30 mg via SUBCUTANEOUS
  Filled 2023-03-25 (×5): qty 0.3

## 2023-03-25 MED ORDER — MIDODRINE HCL 5 MG PO TABS
5.0000 mg | ORAL_TABLET | Freq: Three times a day (TID) | ORAL | Status: DC
  Administered 2023-03-25 (×2): 5 mg via ORAL
  Filled 2023-03-25 (×2): qty 1

## 2023-03-25 MED ORDER — MIDODRINE HCL 5 MG PO TABS
10.0000 mg | ORAL_TABLET | Freq: Three times a day (TID) | ORAL | Status: DC
  Administered 2023-03-26 – 2023-03-27 (×6): 10 mg via ORAL
  Filled 2023-03-25 (×6): qty 2

## 2023-03-25 MED ORDER — FUROSEMIDE 10 MG/ML IJ SOLN
20.0000 mg | Freq: Two times a day (BID) | INTRAMUSCULAR | Status: DC
  Administered 2023-03-25: 20 mg via INTRAVENOUS
  Filled 2023-03-25: qty 2

## 2023-03-25 MED ORDER — INSULIN ASPART 100 UNIT/ML IJ SOLN: 0.0000 [IU] | Freq: Every day | INTRAMUSCULAR | Status: DC

## 2023-03-25 MED ORDER — SODIUM CHLORIDE 0.9% IV SOLUTION: Freq: Once | INTRAVENOUS | Status: AC

## 2023-03-25 MED ORDER — ALBUMIN HUMAN 25 % IV SOLN
25.0000 g | Freq: Four times a day (QID) | INTRAVENOUS | Status: AC
  Administered 2023-03-25: 12.5 g via INTRAVENOUS
  Administered 2023-03-25 – 2023-03-26 (×2): 25 g via INTRAVENOUS
  Filled 2023-03-25 (×3): qty 100

## 2023-03-25 MED FILL — Lidocaine HCl Local Preservative Free (PF) Inj 2%: INTRAMUSCULAR | Qty: 14 | Status: AC

## 2023-03-25 MED FILL — Heparin Sodium (Porcine) Inj 1000 Unit/ML: Qty: 1000 | Status: AC

## 2023-03-25 MED FILL — Potassium Chloride Inj 2 mEq/ML: INTRAVENOUS | Qty: 40 | Status: AC

## 2023-03-25 NOTE — Consult Note (Signed)
NAME:  [name], MRN:  [id number], DOB:  03-18-1961, LOS: 3 ADMISSION DATE:  03/22/2023, CONSULTATION DATE:  12/12 REFERRING MD:  Dr. Dorris Fetch, CHIEF COMPLAINT:  CABG x3; acute respiratory failure w/ hypoxia   History of Present Illness:  Patient is a 62 yo M w/ pertinent PMH CAD, carotid stenosis s/p right carotid stent in 2022, TIA, tobacco abuse, HLD, HTN, hypothyroidism presents to [facility] on 12/10 w/ chest pain.  Patient's chest pain ongoing for past 2 months worse w/ exertion. Having some radiation to left arm and neck. Seen by cardiology outpt. Coronary CTA w/ severe proximal LM stenosis, moderate proximal Lcx stenosis. Was sent to ED on 12/6 w/ persistent unstable angina. Trop negative. Left heart cath showing severe ostial left main 70% stenosis. TCTS consulted w/ plan for CABG on 12/11. Discharged on 12/9. 12/11 came to [facility] for CABG x3. Post op intubated/sedated. Extubated later that day.  On 12/12 patient with increasing o2 requirements. Sats mid 80s on 15L salter HFNC. PCCM consulted.   Pertinent  Medical History   Past Medical History:  Diagnosis Date   Arthritis    Carotid artery disease (HCC)    seen by [facility]   Coronary artery disease    Hx of transient ischemic attack (TIA)    Hyperlipidemia    Hypertension    Thyroid disease    hypothyrodism     Significant Hospital Events: Including procedures, antibiotic start and stop dates in addition to other pertinent events   12/11 cabg x3 12/12 increasing o2 requirements; pccm consulted  Interim History / Subjective:  O2 needs a bit better today. Denies complaints. Still not doing great with IS. Minimal pain issues.  Objective   Blood pressure (!) 106/55, pulse 74, temperature 98.2 F (36.8 C), temperature source Oral, resp. rate (!) 21, height 5\' 6"  (1.676 m), weight 82.3 kg, SpO2 90%.    FiO2 (%):  [90 %-100 %] 90 %   Intake/Output Summary (Last 24 hours) at 03/25/2023 1114 Last data  filed at 03/25/2023 1000 Gross per 24 hour  Intake 771.2 ml  Output 1709 ml  Net -937.8 ml   Filed Weights   03/23/23 0448 03/24/23 0500 03/25/23 0500  Weight: 77.2 kg 82 kg 82.3 kg    Examination: No distress sitting in chair Breath sounds diminished bases [place] of bilateral lungs show limited movement and atelectasis, small effusion on left Heart sounds regular Chest drain small dark bloody output, no air leak Abd soft, Ext warm  BUN/Cr drifting up WBC stably elevated Plts stable CXR stable effusion and atelectasis  Resolved Hospital Problem list     Assessment & Plan:  Ongoing hypoxemia- shunt physiology c/w atelectasis and diaphragmatic weakness. Hopefully improves as we de-line and mobilize more.  RT working with him on breathing. Did not like the BIPAP; we can try CPAP tonight to see if like this more.  S/p 3-vessel CABG CAD HTN HLD - GDMT per primary - Drain out soon hopefully depending on output  AKI - Avoid nephrotoxins - Diuretics ordered, will try some 25% albumin with this to facilitate fluids movement  Hypothyroidism- TSH a bit low on 03/18/23; will need a recheck as OP, continue synthroid Plan: -synthroid   Best Practice (right click and "Reselect all SmartList Selections" daily)   Diet/type: regular DVT prophylaxis: LMWH GI prophylaxis: PPI Lines: N/A Foley:  Yes, and it is still needed Code Status:  full code Last date of multidisciplinary goals of care discussion [per primary]  Will follow with you while in ICU

## 2023-03-25 NOTE — Progress Notes (Signed)
Patient ID: [name], male   DOB: 1960/11/29, 62 y.o.   MRN: [id number]  TCTS Evening Rounds:  BP has been borderline today and NE started this afternoon. Co-ox 59% but Hgb 6.9 on that gas which is unchanged from 7.0 this am and sats are mid 90's on HFNC 100%.  CXR this am looked improved with mild bibasilar atelectasis and reduced lung volumes. He smokes 1 and 1/2 packs per day he says. Lungs sound ok at this time. Needs to continue IS and flutter.  UO 25/hrs. Did not respond to 20 mg IV lasix which is not surprising with bump in creat to 2.0 this am from baseline of 1.5. Will DC lasix and observe for now. Hopefully time and higher bp will resolve this.  Will transfuse a unit of PRBC's this pm which should help BP and oxygen delivery.

## 2023-03-25 NOTE — Progress Notes (Signed)
Pt. Requested to come off bipap. Pt. Placed back on HHFNC.

## 2023-03-25 NOTE — Plan of Care (Signed)
  Problem: Education: Goal: Knowledge of General Education information will improve Description: Including pain rating scale, medication(s)/side effects and non-pharmacologic comfort measures Outcome: Progressing   Problem: Health Behavior/Discharge Planning: Goal: Ability to manage health-related needs will improve Outcome: Progressing   Problem: Clinical Measurements: Goal: Ability to maintain clinical measurements within normal limits will improve Outcome: Progressing Goal: Will remain free from infection Outcome: Progressing Goal: Diagnostic test results will improve Outcome: Progressing Goal: Respiratory complications will improve Outcome: Progressing Goal: Cardiovascular complication will be avoided Outcome: Progressing   Problem: Activity: Goal: Risk for activity intolerance will decrease Outcome: Progressing   Problem: Nutrition: Goal: Adequate nutrition will be maintained Outcome: Progressing   Problem: Coping: Goal: Level of anxiety will decrease Outcome: Progressing   Problem: Elimination: Goal: Will not experience complications related to bowel motility Outcome: Progressing Goal: Will not experience complications related to urinary retention Outcome: Progressing   Problem: Pain Management: Goal: General experience of comfort will improve Outcome: Progressing   Problem: Safety: Goal: Ability to remain free from injury will improve Outcome: Progressing   Problem: Skin Integrity: Goal: Risk for impaired skin integrity will decrease Outcome: Progressing   Problem: Education: Goal: Understanding of cardiac disease, CV risk reduction, and recovery process will improve Outcome: Progressing Goal: Individualized Educational Video(s) Outcome: Progressing   Problem: Activity: Goal: Ability to tolerate increased activity will improve Outcome: Progressing   Problem: Cardiac: Goal: Ability to achieve and maintain adequate cardiovascular perfusion will  improve Outcome: Progressing   Problem: Health Behavior/Discharge Planning: Goal: Ability to safely manage health-related needs after discharge will improve Outcome: Progressing   Problem: Education: Goal: Will demonstrate proper wound care and an understanding of methods to prevent future damage Outcome: Progressing Goal: Knowledge of disease or condition will improve Outcome: Progressing Goal: Knowledge of the prescribed therapeutic regimen will improve Outcome: Progressing Goal: Individualized Educational Video(s) Outcome: Progressing   Problem: Activity: Goal: Risk for activity intolerance will decrease Outcome: Progressing   Problem: Cardiac: Goal: Will achieve and/or maintain hemodynamic stability Outcome: Progressing   Problem: Clinical Measurements: Goal: Postoperative complications will be avoided or minimized Outcome: Progressing   Problem: Respiratory: Goal: Respiratory status will improve Outcome: Progressing   Problem: Skin Integrity: Goal: Wound healing without signs and symptoms of infection Outcome: Progressing Goal: Risk for impaired skin integrity will decrease Outcome: Progressing   Problem: Urinary Elimination: Goal: Ability to achieve and maintain adequate renal perfusion and functioning will improve Outcome: Progressing

## 2023-03-25 NOTE — Plan of Care (Signed)
  Problem: Education: Goal: Knowledge of General Education information will improve Description: Including pain rating scale, medication(s)/side effects and non-pharmacologic comfort measures Outcome: Progressing   Problem: Health Behavior/Discharge Planning: Goal: Ability to manage health-related needs will improve Outcome: Progressing   Problem: Activity: Goal: Risk for activity intolerance will decrease Outcome: Not Progressing   

## 2023-03-26 ENCOUNTER — Inpatient Hospital Stay (HOSPITAL_COMMUNITY): Payer: Commercial Managed Care - PPO

## 2023-03-26 DIAGNOSIS — I2585 Chronic coronary microvascular dysfunction: Secondary | ICD-10-CM | POA: Diagnosis not present

## 2023-03-26 LAB — BPAM RBC
Blood Product Expiration Date: 202501012359
Blood Product Expiration Date: 202501022359
Blood Product Expiration Date: 202501022359
ISSUE DATE / TIME: 202412111301
ISSUE DATE / TIME: 202412130315
ISSUE DATE / TIME: 202412131738
Unit Type and Rh: 6200
Unit Type and Rh: 6200
Unit Type and Rh: 6200

## 2023-03-26 LAB — TYPE AND SCREEN
ABO/RH(D): A POS
ABO/RH(D): A POS
Antibody Screen: NEGATIVE
Antibody Screen: NEGATIVE
Unit division: 0
Unit division: 0
Unit division: 0

## 2023-03-26 LAB — BASIC METABOLIC PANEL
Anion gap: 4 — ABNORMAL LOW (ref 5–15)
BUN: 63 mg/dL — ABNORMAL HIGH (ref 8–23)
CO2: 21 mmol/L — ABNORMAL LOW (ref 22–32)
Calcium: 7.9 mg/dL — ABNORMAL LOW (ref 8.9–10.3)
Chloride: 109 mmol/L (ref 98–111)
Creatinine, Ser: 2.44 mg/dL — ABNORMAL HIGH (ref 0.61–1.24)
GFR, Estimated: 29 mL/min — ABNORMAL LOW (ref >60)
Glucose, Bld: 112 mg/dL — ABNORMAL HIGH (ref 70–99)
Potassium: 4.1 mmol/L (ref 3.5–5.1)
Sodium: 134 mmol/L — ABNORMAL LOW (ref 135–145)

## 2023-03-26 LAB — GLUCOSE, CAPILLARY
Glucose-Capillary: 117 mg/dL — ABNORMAL HIGH (ref 70–99)
Glucose-Capillary: 133 mg/dL — ABNORMAL HIGH (ref 70–99)
Glucose-Capillary: 113 mg/dL — ABNORMAL HIGH (ref 70–99)
Glucose-Capillary: 106 mg/dL — ABNORMAL HIGH (ref 70–99)
Glucose-Capillary: 129 mg/dL — ABNORMAL HIGH (ref 70–99)

## 2023-03-26 LAB — CBC
HCT: 21.9 % — ABNORMAL LOW (ref 39.0–52.0)
Hemoglobin: 7.5 g/dL — ABNORMAL LOW (ref 13.0–17.0)
MCH: 32.8 pg (ref 26.0–34.0)
MCHC: 34.2 g/dL (ref 30.0–36.0)
MCV: 95.6 fL (ref 80.0–100.0)
Platelets: 126 10*3/uL — ABNORMAL LOW (ref 150–400)
RBC: 2.29 MIL/uL — ABNORMAL LOW (ref 4.22–5.81)
RDW: 14.7 % (ref 11.5–15.5)
WBC: 17.3 10*3/uL — ABNORMAL HIGH (ref 4.0–10.5)
nRBC: 0.4 % — ABNORMAL HIGH (ref 0.0–0.2)

## 2023-03-26 MED ORDER — TRAMADOL HCL 50 MG PO TABS
50.0000 mg | ORAL_TABLET | Freq: Four times a day (QID) | ORAL | Status: DC | PRN
  Administered 2023-03-27: 50 mg via ORAL
  Filled 2023-03-26: qty 1

## 2023-03-26 MED ORDER — POLYETHYLENE GLYCOL 3350 17 G PO PACK
17.0000 g | PACK | Freq: Two times a day (BID) | ORAL | Status: DC
Start: 2023-03-26 — End: 2023-03-30
  Administered 2023-03-26 – 2023-03-28 (×4): 17 g via ORAL
  Filled 2023-03-26 (×5): qty 1

## 2023-03-26 MED ORDER — FE FUM-VIT C-VIT B12-FA 460-60-0.01-1 MG PO CAPS
1.0000 | ORAL_CAPSULE | Freq: Every day | ORAL | Status: DC
  Administered 2023-03-26 – 2023-03-30 (×5): 1 via ORAL
  Filled 2023-03-26 (×5): qty 1

## 2023-03-26 NOTE — Progress Notes (Signed)
Patient ID: [name], male   DOB: February 03, 1961, 62 y.o.   MRN: [id number]  TCTS Evening Rounds:  Hemodynamically stable in sinus rhythm.  Chest tubes out today and walked two laps.  Oxygen requirement improving. Down to 8L HFNC.

## 2023-03-26 NOTE — Consult Note (Signed)
   NAME:  [name], MRN:  [id number], DOB:  [birth year], LOS: 4 ADMISSION DATE:  03/22/2023, CONSULTATION DATE:  12/12 REFERRING MD:  Dr. Dorris Fetch, CHIEF COMPLAINT:  CABG x3; acute respiratory failure w/ hypoxia   History of Present Illness:  Patient is a 62 yo M w/ pertinent PMH CAD, carotid stenosis s/p right carotid stent in 2022, TIA, tobacco abuse, HLD, HTN, hypothyroidism presents to [facility] on 12/10 w/ chest pain.  Patient's chest pain ongoing for past 2 months worse w/ exertion. Having some radiation to left arm and neck. Seen by cardiology outpt. Coronary CTA w/ severe proximal LM stenosis, moderate proximal Lcx stenosis. Was sent to ED on 12/6 w/ persistent unstable angina. Trop negative. Left heart cath showing severe ostial left main 70% stenosis. TCTS consulted w/ plan for CABG on 12/11. Discharged on 12/9. 12/11 came to [facility] for CABG x3. Post op intubated/sedated. Extubated later that day.  On 12/12 patient with increasing o2 requirements. Sats mid 80s on 15L salter HFNC. PCCM consulted.   Pertinent  Medical History   Past Medical History:  Diagnosis Date   Arthritis    Carotid artery disease (HCC)    seen by [facility]   Coronary artery disease    Hx of transient ischemic attack (TIA)    Hyperlipidemia    Hypertension    Thyroid disease    hypothyrodism     Significant Hospital Events: Including procedures, antibiotic start and stop dates in addition to other pertinent events   12/11 cabg x3 12/12 increasing o2 requirements; pccm consulted  Interim History / Subjective:  O2 needs improved. Stable symptoms Chest tube output more serous now and slowing BP stable  Objective   Blood pressure (!) 106/54, pulse 78, temperature 98.9 F (37.2 C), temperature source Oral, resp. rate 19, height 5\' 6"  (1.676 m), weight 82.7 kg, SpO2 92%.    FiO2 (%):  [75 %-100 %] 75 %   Intake/Output Summary (Last 24 hours) at 03/26/2023 0907 Last data filed at  03/26/2023 0800 Gross per 24 hour  Intake 1333.4 ml  Output 885 ml  Net 448.4 ml   Filed Weights   03/24/23 0500 03/25/23 0500 03/26/23 0600  Weight: 82 kg 82.3 kg 82.7 kg    Examination: No distress sitting in chair IS up to 500 now Lungs diminished bases Moves to command Sternotomy looks good Serous output from drain Foley in place Sats 93% 25LPM, 75% WBC improved  Resolved Hospital Problem list     Assessment & Plan:  Hypoxemia related to atelectasis S/p 3-vessel CABG CAD HTN HLD Intermittent hyperglycmia AKI- a little worse with diuresis Hypothyroidism- TSH a bit low on 03/18/23; will need a recheck as OP, continue synthroid  - Continue to encourage IS - Hopefully foley and pleural drain out soon: mobility will really help him - Will follow a couple more days to assure continued O2 improvement - SSI  Best Practice (right click and "Reselect all SmartList Selections" daily)   Diet/type: regular DVT prophylaxis: LMWH GI prophylaxis: PPI Lines: N/A Foley:  Yes, and it is still needed Code Status:  full code Last date of multidisciplinary goals of care discussion [per primary]  Will follow with you while in ICU Myrla Halsted MD PCCM

## 2023-03-26 NOTE — Progress Notes (Signed)
Notified remote/ e-link of patient's low UOP

## 2023-03-26 NOTE — Progress Notes (Signed)
Rt removed HHFNC and placed pt on 15LPM salter. RN aware. HHF on SB at this time.

## 2023-03-26 NOTE — Progress Notes (Signed)
3 Days Post-Op Procedure(s) (LRB): CORONARY ARTERY BYPASS GRAFTING (CABG), UTILIZING BILATERAL INTERNAL MAMMARY ARTERIES AND ENDOSCOPIC VEIN HARVEST RIGHT GREATER SAPHENOUS VEIN. (N/A) TRANSESOPHAGEAL ECHOCARDIOGRAM (TEE) (N/A) Subjective: No complaints. Sitting up in chair.   HFNC down to 75%.  Objective: Vital signs in last 24 hours: Temp:  [97.9 F (36.6 C)-98.9 F (37.2 C)] 98.9 F (37.2 C) (12/14 0800) Pulse Rate:  [68-86] 85 (12/14 1200) Cardiac Rhythm: Normal sinus rhythm (12/14 1200) Resp:  [13-30] 20 (12/14 1200) BP: (84-136)/(40-68) 114/55 (12/14 1200) SpO2:  [87 %-97 %] 94 % (12/14 1200) FiO2 (%):  [75 %-100 %] 75 % (12/14 1200) Weight:  [82.7 kg] 82.7 kg (12/14 0600)  Hemodynamic parameters for last 24 hours:    Intake/Output from previous day: 12/13 0701 - 12/14 0700 In: 1348.2 [P.O.:510; I.V.:138.2; Blood:450; IV Piggyback:250] Out: 715 [Urine:510; Chest Tube:205] Intake/Output this shift: Total I/O In: 360 [P.O.:360] Out: 493 [Urine:213; Chest Tube:280]  General appearance: alert and cooperative Neurologic: intact Heart: regular rate and rhythm Lungs: diminished breath sounds bibasilar Extremities: edema mild Wound: incision looks good.  Lab Results: Recent Labs    03/25/23 0457 03/26/23 0418  WBC 22.0* 17.3*  HGB 7.0* 7.5*  HCT 21.0* 21.9*  PLT 135* 126*   BMET:  Recent Labs    03/25/23 0457 03/26/23 0418  NA 137 134*  K 4.6 4.1  CL 108 109  CO2 20* 21*  GLUCOSE 115* 112*  BUN 45* 63*  CREATININE 2.06* 2.44*  CALCIUM 8.1* 7.9*    PT/INR:  Recent Labs    03/23/23 1546  LABPROT 17.0*  INR 1.4*   ABG    Component Value Date/Time   PHART 7.320 (L) 03/23/2023 2105   HCO3 20.1 03/23/2023 2105   TCO2 21 (L) 03/23/2023 2105   ACIDBASEDEF 6.0 (H) 03/23/2023 2105   O2SAT 59.3 03/25/2023 1628   CBG (last 3)  Recent Labs    03/26/23 0657 03/26/23 0800 03/26/23 1031  GLUCAP 117* 133* 113*   CXR: mild bibasilar atelectasis  L>R  Assessment/Plan: S/P Procedure(s) (LRB): CORONARY ARTERY BYPASS GRAFTING (CABG), UTILIZING BILATERAL INTERNAL MAMMARY ARTERIES AND ENDOSCOPIC VEIN HARVEST RIGHT GREATER SAPHENOUS VEIN. (N/A) TRANSESOPHAGEAL ECHOCARDIOGRAM (TEE) (N/A)  POD 3 Hemodynamically stable off NE, on midodrine 10 tid. Rhythm is sinus 90's. Holding off on beta blocker for now.  Likely SIRS response to surgery with high oxygen requirement despite CXR looking ok, leukocytosis, AKI.   AKI: BUN up to 63, creat up to 2.44 this am. His baseline has been 1.5. Will hold off on diuresis and continue observation. This should resolve with time.  Small air leak from chest tubes and may be a crack in connector. When I can get some tubing clamps I will assess for location of leak and consider removing chest tubes today.  IS, OOB.  Glucose under good control. Continue SSI today.  Anemia: stable. Start iron.   LOS: 4 days    Alleen Borne 03/26/2023

## 2023-03-26 NOTE — Plan of Care (Signed)
  Problem: Education: Goal: Knowledge of General Education information will improve Description: Including pain rating scale, medication(s)/side effects and non-pharmacologic comfort measures Outcome: Progressing   Problem: Health Behavior/Discharge Planning: Goal: Ability to manage health-related needs will improve Outcome: Progressing   Problem: Clinical Measurements: Goal: Ability to maintain clinical measurements within normal limits will improve Outcome: Progressing Goal: Will remain free from infection Outcome: Progressing Goal: Diagnostic test results will improve Outcome: Progressing Goal: Respiratory complications will improve Outcome: Progressing Goal: Cardiovascular complication will be avoided Outcome: Progressing   Problem: Activity: Goal: Risk for activity intolerance will decrease Outcome: Progressing   Problem: Nutrition: Goal: Adequate nutrition will be maintained Outcome: Progressing   Problem: Coping: Goal: Level of anxiety will decrease Outcome: Progressing   Problem: Elimination: Goal: Will not experience complications related to bowel motility Outcome: Progressing Goal: Will not experience complications related to urinary retention Outcome: Progressing   Problem: Pain Management: Goal: General experience of comfort will improve Outcome: Progressing   Problem: Safety: Goal: Ability to remain free from injury will improve Outcome: Progressing   Problem: Skin Integrity: Goal: Risk for impaired skin integrity will decrease Outcome: Progressing   Problem: Education: Goal: Understanding of cardiac disease, CV risk reduction, and recovery process will improve Outcome: Progressing Goal: Individualized Educational Video(s) Outcome: Progressing   Problem: Activity: Goal: Ability to tolerate increased activity will improve Outcome: Progressing   Problem: Cardiac: Goal: Ability to achieve and maintain adequate cardiovascular perfusion will  improve Outcome: Progressing   Problem: Health Behavior/Discharge Planning: Goal: Ability to safely manage health-related needs after discharge will improve Outcome: Progressing   Problem: Education: Goal: Will demonstrate proper wound care and an understanding of methods to prevent future damage Outcome: Progressing Goal: Knowledge of disease or condition will improve Outcome: Progressing Goal: Knowledge of the prescribed therapeutic regimen will improve Outcome: Progressing Goal: Individualized Educational Video(s) Outcome: Progressing   Problem: Activity: Goal: Risk for activity intolerance will decrease Outcome: Progressing   Problem: Cardiac: Goal: Will achieve and/or maintain hemodynamic stability Outcome: Progressing   Problem: Clinical Measurements: Goal: Postoperative complications will be avoided or minimized Outcome: Progressing   Problem: Respiratory: Goal: Respiratory status will improve Outcome: Progressing   Problem: Skin Integrity: Goal: Wound healing without signs and symptoms of infection Outcome: Progressing Goal: Risk for impaired skin integrity will decrease Outcome: Progressing   Problem: Urinary Elimination: Goal: Ability to achieve and maintain adequate renal perfusion and functioning will improve Outcome: Progressing   Problem: Education: Goal: Ability to describe self-care measures that may prevent or decrease complications (Diabetes Survival Skills Education) will improve Outcome: Progressing Goal: Individualized Educational Video(s) Outcome: Progressing   Problem: Coping: Goal: Ability to adjust to condition or change in health will improve Outcome: Progressing   Problem: Fluid Volume: Goal: Ability to maintain a balanced intake and output will improve Outcome: Progressing   Problem: Health Behavior/Discharge Planning: Goal: Ability to identify and utilize available resources and services will improve Outcome: Progressing Goal:  Ability to manage health-related needs will improve Outcome: Progressing   Problem: Metabolic: Goal: Ability to maintain appropriate glucose levels will improve Outcome: Progressing   Problem: Nutritional: Goal: Maintenance of adequate nutrition will improve Outcome: Progressing Goal: Progress toward achieving an optimal weight will improve Outcome: Progressing   Problem: Skin Integrity: Goal: Risk for impaired skin integrity will decrease Outcome: Progressing   Problem: Tissue Perfusion: Goal: Adequacy of tissue perfusion will improve Outcome: Progressing

## 2023-03-27 ENCOUNTER — Inpatient Hospital Stay (HOSPITAL_COMMUNITY): Payer: Commercial Managed Care - PPO

## 2023-03-27 LAB — ECHO INTRAOPERATIVE TEE
AV Mean grad: 2 mm[Hg]
AV Peak grad: 4.6 mm[Hg]
AV Vena cont: 0.5 cm
Ao pk vel: 1.07 m/s
Height: 66 in
P 1/2 time: 763 ms
Weight: 2721.6 [oz_av]

## 2023-03-27 LAB — MAGNESIUM: Magnesium: 3.4 mg/dL — ABNORMAL HIGH (ref 1.7–2.4)

## 2023-03-27 LAB — CBC
HCT: 22.9 % — ABNORMAL LOW (ref 39.0–52.0)
Hemoglobin: 7.8 g/dL — ABNORMAL LOW (ref 13.0–17.0)
MCH: 32.9 pg (ref 26.0–34.0)
MCHC: 34.1 g/dL (ref 30.0–36.0)
MCV: 96.6 fL (ref 80.0–100.0)
Platelets: 160 10*3/uL (ref 150–400)
RBC: 2.37 MIL/uL — ABNORMAL LOW (ref 4.22–5.81)
RDW: 14.6 % (ref 11.5–15.5)
WBC: 14.7 10*3/uL — ABNORMAL HIGH (ref 4.0–10.5)
nRBC: 0.6 % — ABNORMAL HIGH (ref 0.0–0.2)

## 2023-03-27 LAB — GLUCOSE, CAPILLARY
Glucose-Capillary: 107 mg/dL — ABNORMAL HIGH (ref 70–99)
Glucose-Capillary: 143 mg/dL — ABNORMAL HIGH (ref 70–99)
Glucose-Capillary: 109 mg/dL — ABNORMAL HIGH (ref 70–99)

## 2023-03-27 LAB — BASIC METABOLIC PANEL
Anion gap: 7 (ref 5–15)
BUN: 64 mg/dL — ABNORMAL HIGH (ref 8–23)
CO2: 22 mmol/L (ref 22–32)
Calcium: 8.3 mg/dL — ABNORMAL LOW (ref 8.9–10.3)
Chloride: 109 mmol/L (ref 98–111)
Creatinine, Ser: 2.07 mg/dL — ABNORMAL HIGH (ref 0.61–1.24)
GFR, Estimated: 36 mL/min — ABNORMAL LOW (ref >60)
Glucose, Bld: 98 mg/dL (ref 70–99)
Potassium: 4.1 mmol/L (ref 3.5–5.1)
Sodium: 138 mmol/L (ref 135–145)

## 2023-03-27 MED ORDER — SORBITOL 70 % SOLN
30.0000 mL | Freq: Once | Status: AC
  Administered 2023-03-27: 30 mL via ORAL
  Filled 2023-03-27: qty 30

## 2023-03-27 NOTE — Progress Notes (Signed)
4 Days Post-Op Procedure(s) (LRB): CORONARY ARTERY BYPASS GRAFTING (CABG), UTILIZING BILATERAL INTERNAL MAMMARY ARTERIES AND ENDOSCOPIC VEIN HARVEST RIGHT GREATER SAPHENOUS VEIN. (N/A) TRANSESOPHAGEAL ECHOCARDIOGRAM (TEE) (N/A) Subjective:  Tired but ambulating well.  Oxygen requirement continues to improve.   Objective: Vital signs in last 24 hours: Temp:  [98.2 F (36.8 C)-99.5 F (37.5 C)] 98.2 F (36.8 C) (12/15 0800) Pulse Rate:  [69-97] 83 (12/15 0930) Cardiac Rhythm: Normal sinus rhythm (12/15 0800) Resp:  [12-37] 19 (12/15 0930) BP: (89-142)/(48-73) 116/58 (12/15 0900) SpO2:  [92 %-98 %] 94 % (12/15 0930) FiO2 (%):  [75 %] 75 % (12/14 1200) Weight:  [81 kg] 81 kg (12/15 0500)  Hemodynamic parameters for last 24 hours:    Intake/Output from previous day: 12/14 0701 - 12/15 0700 In: 360 [P.O.:360] Out: 1478 [Urine:1158; Chest Tube:320] Intake/Output this shift: Total I/O In: 240 [P.O.:240] Out: 135 [Urine:135]  General appearance: alert and cooperative Neurologic: intact Heart: regular rate and rhythm Lungs: clear to auscultation bilaterally Extremities: edema mild Wound: incision healing well  Lab Results: Recent Labs    03/26/23 0418 03/27/23 0328  WBC 17.3* 14.7*  HGB 7.5* 7.8*  HCT 21.9* 22.9*  PLT 126* 160   BMET:  Recent Labs    03/26/23 0418 03/27/23 0328  NA 134* 138  K 4.1 4.1  CL 109 109  CO2 21* 22  GLUCOSE 112* 98  BUN 63* 64*  CREATININE 2.44* 2.07*  CALCIUM 7.9* 8.3*    PT/INR: No results for input(s): "LABPROT", "INR" in the last 72 hours. ABG    Component Value Date/Time   PHART 7.320 (L) 03/23/2023 2105   HCO3 20.1 03/23/2023 2105   TCO2 21 (L) 03/23/2023 2105   ACIDBASEDEF 6.0 (H) 03/23/2023 2105   O2SAT 59.3 03/25/2023 1628   CBG (last 3)  Recent Labs    03/26/23 1607 03/26/23 2123 03/27/23 0619  GLUCAP 106* 129* 107*    Assessment/Plan: S/P Procedure(s) (LRB): CORONARY ARTERY BYPASS GRAFTING (CABG),  UTILIZING BILATERAL INTERNAL MAMMARY ARTERIES AND ENDOSCOPIC VEIN HARVEST RIGHT GREATER SAPHENOUS VEIN. (N/A) TRANSESOPHAGEAL ECHOCARDIOGRAM (TEE) (N/A)  POD 4 Hemodynamically stable on midodrine 10 tid. BP is 90's to low 100's so will continue this for now and hold off on beta blocker. Postop vasoplegia likely due to SIRS.  Oxygen requirement continues to improve. Down to 4L [place]. CXR  looks good.  AKI improving with creat down to 2.07. -1100 cc yesterday without diuretic so will hold off on diuresis today and give kidneys another day to improve.  DC pacing wires, sleeve and foley.   Continue IS, ambulation and nutrition.  Anemia stable. On iron.   LOS: 5 days    [name] 03/27/2023

## 2023-03-27 NOTE — Progress Notes (Signed)
Patient ID: [name], male   DOB: 1960/07/29, 62 y.o.   MRN: [id number]  TCTS Evening Rounds:  Hemodynamically stable in NSR 80's.  Sats 96% on 4L.   Good UO today without lasix. -475 cc so far.  Ambulated.

## 2023-03-27 NOTE — Progress Notes (Signed)
Doing much better with O2 needs Will be available PRN.  Myrla Halsted MD PCCM

## 2023-03-28 LAB — CBC
HCT: 24.1 % — ABNORMAL LOW (ref 39.0–52.0)
Hemoglobin: 8.3 g/dL — ABNORMAL LOW (ref 13.0–17.0)
MCH: 33.1 pg (ref 26.0–34.0)
MCHC: 34.4 g/dL (ref 30.0–36.0)
MCV: 96 fL (ref 80.0–100.0)
Platelets: 218 10*3/uL (ref 150–400)
RBC: 2.51 MIL/uL — ABNORMAL LOW (ref 4.22–5.81)
RDW: 14.4 % (ref 11.5–15.5)
WBC: 12.9 10*3/uL — ABNORMAL HIGH (ref 4.0–10.5)
nRBC: 0.2 % (ref 0.0–0.2)

## 2023-03-28 LAB — BASIC METABOLIC PANEL
Anion gap: 4 — ABNORMAL LOW (ref 5–15)
BUN: 50 mg/dL — ABNORMAL HIGH (ref 8–23)
CO2: 21 mmol/L — ABNORMAL LOW (ref 22–32)
Calcium: 8.2 mg/dL — ABNORMAL LOW (ref 8.9–10.3)
Chloride: 113 mmol/L — ABNORMAL HIGH (ref 98–111)
Creatinine, Ser: 1.57 mg/dL — ABNORMAL HIGH (ref 0.61–1.24)
GFR, Estimated: 50 mL/min — ABNORMAL LOW (ref >60)
Glucose, Bld: 103 mg/dL — ABNORMAL HIGH (ref 70–99)
Potassium: 4.3 mmol/L (ref 3.5–5.1)
Sodium: 138 mmol/L (ref 135–145)

## 2023-03-28 LAB — GLUCOSE, CAPILLARY
Glucose-Capillary: 101 mg/dL — ABNORMAL HIGH (ref 70–99)
Glucose-Capillary: 171 mg/dL — ABNORMAL HIGH (ref 70–99)
Glucose-Capillary: 126 mg/dL — ABNORMAL HIGH (ref 70–99)

## 2023-03-28 MED ORDER — ZOLPIDEM TARTRATE 5 MG PO TABS: 5.0000 mg | ORAL_TABLET | Freq: Every evening | ORAL | Status: DC | PRN

## 2023-03-28 MED ORDER — MAGNESIUM HYDROXIDE 400 MG/5ML PO SUSP: 30.0000 mL | Freq: Every day | ORAL | Status: DC | PRN

## 2023-03-28 MED ORDER — ~~LOC~~ CARDIAC SURGERY, PATIENT & FAMILY EDUCATION: Freq: Once | Status: AC

## 2023-03-28 MED ORDER — SODIUM CHLORIDE 0.9% FLUSH
3.0000 mL | Freq: Two times a day (BID) | INTRAVENOUS | Status: DC
  Administered 2023-03-28 – 2023-03-29 (×2): 3 mL via INTRAVENOUS

## 2023-03-28 MED ORDER — SODIUM CHLORIDE 0.9 % IV SOLN
250.0000 mL | INTRAVENOUS | Status: AC | PRN
Start: 2023-03-28 — End: 2023-03-29

## 2023-03-28 MED ORDER — SODIUM CHLORIDE 0.9% FLUSH: 3.0000 mL | INTRAVENOUS | Status: DC | PRN

## 2023-03-28 MED ORDER — ASPIRIN 81 MG PO TBEC
81.0000 mg | DELAYED_RELEASE_TABLET | Freq: Every day | ORAL | Status: DC
  Administered 2023-03-29: 81 mg via ORAL
  Filled 2023-03-28: qty 1

## 2023-03-28 MED ORDER — MIDODRINE HCL 5 MG PO TABS
5.0000 mg | ORAL_TABLET | Freq: Three times a day (TID) | ORAL | Status: DC
  Administered 2023-03-28 (×3): 5 mg via ORAL
  Filled 2023-03-28 (×3): qty 1

## 2023-03-28 MED ORDER — CLOPIDOGREL BISULFATE 75 MG PO TABS
75.0000 mg | ORAL_TABLET | Freq: Every day | ORAL | Status: DC
  Administered 2023-03-28 – 2023-03-30 (×3): 75 mg via ORAL
  Filled 2023-03-28 (×3): qty 1

## 2023-03-28 MED ORDER — ALUM & MAG HYDROXIDE-SIMETH 200-200-20 MG/5ML PO SUSP: 15.0000 mL | Freq: Four times a day (QID) | ORAL | Status: DC | PRN

## 2023-03-28 NOTE — Progress Notes (Signed)
5 Days Post-Op Procedure(s) (LRB): CORONARY ARTERY BYPASS GRAFTING (CABG), UTILIZING BILATERAL INTERNAL MAMMARY ARTERIES AND ENDOSCOPIC VEIN HARVEST RIGHT GREATER SAPHENOUS VEIN. (N/A) TRANSESOPHAGEAL ECHOCARDIOGRAM (TEE) (N/A) Subjective: No complaints this AM, walked around unit 2X  Objective: Vital signs in last 24 hours: Temp:  [98.1 F (36.7 C)-98.9 F (37.2 C)] 98.9 F (37.2 C) (12/16 0750) Pulse Rate:  [65-99] 85 (12/16 0600) Cardiac Rhythm: Normal sinus rhythm (12/16 0000) Resp:  [16-30] 25 (12/16 0600) BP: (90-135)/(50-67) 119/60 (12/16 0600) SpO2:  [88 %-100 %] 96 % (12/16 0600) Weight:  [79.7 kg] 79.7 kg (12/16 0500)  Hemodynamic parameters for last 24 hours:    Intake/Output from previous day: 12/15 0701 - 12/16 0700 In: 363 [P.O.:360; I.V.:3] Out: 715 [Urine:715] Intake/Output this shift: No intake/output data recorded.  General appearance: alert, cooperative, and no distress Neurologic: intact Heart: regular rate and rhythm Lungs: clear to auscultation bilaterally Abdomen: normal findings: soft, non-tender  Lab Results: Recent Labs    03/27/23 0328 03/28/23 0326  WBC 14.7* 12.9*  HGB 7.8* 8.3*  HCT 22.9* 24.1*  PLT 160 218   BMET:  Recent Labs    03/27/23 0328 03/28/23 0326  NA 138 138  K 4.1 4.3  CL 109 113*  CO2 22 21*  GLUCOSE 98 103*  BUN 64* 50*  CREATININE 2.07* 1.57*  CALCIUM 8.3* 8.2*    PT/INR: No results for input(s): "LABPROT", "INR" in the last 72 hours. ABG    Component Value Date/Time   PHART 7.320 (L) 03/23/2023 2105   HCO3 20.1 03/23/2023 2105   TCO2 21 (L) 03/23/2023 2105   ACIDBASEDEF 6.0 (H) 03/23/2023 2105   O2SAT 59.3 03/25/2023 1628   CBG (last 3)  Recent Labs    03/27/23 1624 03/27/23 2129 03/28/23 0619  GLUCAP 143* 109* 101*    Assessment/Plan: S/P Procedure(s) (LRB): CORONARY ARTERY BYPASS GRAFTING (CABG), UTILIZING BILATERAL INTERNAL MAMMARY ARTERIES AND ENDOSCOPIC VEIN HARVEST RIGHT GREATER  SAPHENOUS VEIN. (N/A) TRANSESOPHAGEAL ECHOCARDIOGRAM (TEE) (N/A) Doing well NEURO- intact CV- in SR.  BP improved  Decrease midodrine  ASA, Plavix, statin, beta blocker  Hold off on ACE-I with low BP RESP- markedly improved aeration  Continue IS RENAL- creatinine back to baseline 1.6  Weight trending down ENDO- CBG mildly elevated, no DM GI- tolerating diet SCD + enoxaparin Cardiac rehab   LOS: 6 days    Loreli Slot 03/28/2023

## 2023-03-28 NOTE — TOC Initial Note (Signed)
Transition of Care [facility]) - Initial/Assessment Note    Patient Details  Name: [name] MRN: [id number] Date of Birth: 16-Oct-1960  Transition [facility]) CM/SW Contact:    Elliot Cousin, RN Phone Number: [phone] 03/28/2023, 9:21 AM  Clinical Narrative:                  CM spoke to pt and wife at bedside. Wife states she will look into starting the disability process for pt.  States he his insurance will run out in 3 weeks and she had concerns about insurance. Discussed with wife to speak to his employer about COBRA, STD and LTD.  States he is currently on FMLA.  Will continue to follow for dc needs.     Expected Discharge Plan: Home/Self Care Barriers to Discharge: Continued Medical Work up   Patient Goals and CMS Choice Patient states their goals for this hospitalization and ongoing recovery are:: wants to recover          Expected Discharge Plan and Services   Discharge Planning Services: CM Consult   Living arrangements for the past 2 months: Single Family Home                                      Prior Living Arrangements/Services Living arrangements for the past 2 months: Single Family Home Lives with:: Spouse Patient language and need for interpreter reviewed:: Yes Do you feel safe going back to the place where you live?: Yes      Need for Family Participation in Patient Care: Yes (Comment) Care giver support system in place?: Yes (comment)   Criminal Activity/Legal Involvement Pertinent to Current Situation/Hospitalization: No - Comment as needed  Activities of Daily Living      Permission Sought/Granted Permission sought to share information with : Case Manager, Family Supports, PCP Permission granted to share information with : Yes, Verbal Permission Granted  Share Information with NAME: Dadrian Dietz     Permission granted to share info w Relationship: wife  Permission granted to share info w Contact Information:  [phone]  Emotional Assessment Appearance:: Appears stated age Attitude/Demeanor/Rapport: Engaged Affect (typically observed): Accepting Orientation: : Oriented to Self, Oriented to Place, Oriented to  Time, Oriented to Situation   Psych Involvement: No (comment)  Admission diagnosis:  CAD (coronary artery disease) [I25.10] S/P CABG x 3 [Z95.1] Patient Active Problem List   Diagnosis Date Noted   Acute respiratory failure with hypoxia (HCC) 03/24/2023   S/P CABG x 3 03/23/2023   CAD (coronary artery disease) 03/22/2023   Coronary artery disease involving native coronary artery of native heart 03/21/2023   Hypothyroidism 03/18/2023   History of CAD (coronary artery disease) 03/18/2023   Tobacco abuse 03/18/2023   Hx of transient ischemic attack (TIA)    Unstable angina (HCC) 01/24/2023   Carotid stenosis, 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, left 12/17/2020   Common carotid artery stenosis 11/13/2020   Essential hypertension 11/13/2020   Carotid stenosis, 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, bilateral 10/22/2020   Atherosclerotic peripheral vascular disease with intermittent claudication (HCC) 05/28/2016   Hyperlipidemia, mixed 02/06/2016   Symptomatic carotid artery stenosis without infarction, right 11/20/2015   PCP:  Lorn Junes, FNP (Inactive) Pharmacy:   [facility] - [place], [place] - [address] [place] [address] Phone: [phone] Fax: [phone]     Social Drivers of Health (SDOH) Social History:  SDOH Screenings   Food Insecurity: No Food Insecurity (03/22/2023)  Housing: Unknown (03/24/2023)  Transportation Needs: No Transportation Needs (03/22/2023)  Utilities: Not At Risk (03/22/2023)  Tobacco Use: High Risk (03/23/2023)   SDOH Interventions: Housing Interventions: Intervention Not Indicated   Readmission Risk Interventions     No data to display

## 2023-03-28 NOTE — Plan of Care (Signed)
  Problem: Education: Goal: Knowledge of General Education information will improve Description: Including pain rating scale, medication(s)/side effects and non-pharmacologic comfort measures Outcome: Progressing   Problem: Health Behavior/Discharge Planning: Goal: Ability to manage health-related needs will improve Outcome: Progressing   Problem: Clinical Measurements: Goal: Ability to maintain clinical measurements within normal limits will improve Outcome: Progressing Goal: Will remain free from infection Outcome: Progressing Goal: Diagnostic test results will improve Outcome: Progressing Goal: Respiratory complications will improve Outcome: Progressing Goal: Cardiovascular complication will be avoided Outcome: Progressing   Problem: Activity: Goal: Risk for activity intolerance will decrease Outcome: Progressing   Problem: Nutrition: Goal: Adequate nutrition will be maintained Outcome: Progressing   Problem: Coping: Goal: Level of anxiety will decrease Outcome: Progressing   Problem: Elimination: Goal: Will not experience complications related to bowel motility Outcome: Progressing Goal: Will not experience complications related to urinary retention Outcome: Progressing   Problem: Pain Management: Goal: General experience of comfort will improve Outcome: Progressing   Problem: Safety: Goal: Ability to remain free from injury will improve Outcome: Progressing   Problem: Skin Integrity: Goal: Risk for impaired skin integrity will decrease Outcome: Progressing   Problem: Education: Goal: Understanding of cardiac disease, CV risk reduction, and recovery process will improve Outcome: Progressing Goal: Individualized Educational Video(s) Outcome: Progressing   Problem: Activity: Goal: Ability to tolerate increased activity will improve Outcome: Progressing   Problem: Cardiac: Goal: Ability to achieve and maintain adequate cardiovascular perfusion will  improve Outcome: Progressing   Problem: Health Behavior/Discharge Planning: Goal: Ability to safely manage health-related needs after discharge will improve Outcome: Progressing   Problem: Education: Goal: Will demonstrate proper wound care and an understanding of methods to prevent future damage Outcome: Progressing Goal: Knowledge of disease or condition will improve Outcome: Progressing Goal: Knowledge of the prescribed therapeutic regimen will improve Outcome: Progressing Goal: Individualized Educational Video(s) Outcome: Progressing   Problem: Activity: Goal: Risk for activity intolerance will decrease Outcome: Progressing   Problem: Cardiac: Goal: Will achieve and/or maintain hemodynamic stability Outcome: Progressing   Problem: Clinical Measurements: Goal: Postoperative complications will be avoided or minimized Outcome: Progressing   Problem: Respiratory: Goal: Respiratory status will improve Outcome: Progressing   Problem: Skin Integrity: Goal: Wound healing without signs and symptoms of infection Outcome: Progressing Goal: Risk for impaired skin integrity will decrease Outcome: Progressing   Problem: Urinary Elimination: Goal: Ability to achieve and maintain adequate renal perfusion and functioning will improve Outcome: Progressing   Problem: Education: Goal: Ability to describe self-care measures that may prevent or decrease complications (Diabetes Survival Skills Education) will improve Outcome: Progressing Goal: Individualized Educational Video(s) Outcome: Progressing   Problem: Coping: Goal: Ability to adjust to condition or change in health will improve Outcome: Progressing   Problem: Fluid Volume: Goal: Ability to maintain a balanced intake and output will improve Outcome: Progressing   Problem: Health Behavior/Discharge Planning: Goal: Ability to identify and utilize available resources and services will improve Outcome: Progressing Goal:  Ability to manage health-related needs will improve Outcome: Progressing   Problem: Metabolic: Goal: Ability to maintain appropriate glucose levels will improve Outcome: Progressing   Problem: Nutritional: Goal: Maintenance of adequate nutrition will improve Outcome: Progressing Goal: Progress toward achieving an optimal weight will improve Outcome: Progressing   Problem: Skin Integrity: Goal: Risk for impaired skin integrity will decrease Outcome: Progressing   Problem: Tissue Perfusion: Goal: Adequacy of tissue perfusion will improve Outcome: Progressing

## 2023-03-29 ENCOUNTER — Inpatient Hospital Stay (HOSPITAL_COMMUNITY): Payer: Commercial Managed Care - PPO

## 2023-03-29 LAB — BASIC METABOLIC PANEL
Anion gap: 5 (ref 5–15)
BUN: 37 mg/dL — ABNORMAL HIGH (ref 8–23)
CO2: 23 mmol/L (ref 22–32)
Calcium: 8.1 mg/dL — ABNORMAL LOW (ref 8.9–10.3)
Chloride: 113 mmol/L — ABNORMAL HIGH (ref 98–111)
Creatinine, Ser: 1.52 mg/dL — ABNORMAL HIGH (ref 0.61–1.24)
GFR, Estimated: 52 mL/min — ABNORMAL LOW (ref >60)
Glucose, Bld: 130 mg/dL — ABNORMAL HIGH (ref 70–99)
Potassium: 4.2 mmol/L (ref 3.5–5.1)
Sodium: 141 mmol/L (ref 135–145)

## 2023-03-29 NOTE — Progress Notes (Addendum)
Reports walking this morning, will revisit later to mobilize in the afternoon.   F/u at 1300  CARDIAC REHAB PHASE I   PRE:  Rate/Rhythm: 91 NSR  BP:  Sitting: 128/63      SpO2: 97 RA  MODE:  Ambulation: 470 ft    POST:  Rate/Rhythm: 107 ST  BP:  Sitting: 107/66       SpO2: 96 RA  Pt ambulated in the hallway w/o AD requiring supervision assistance. Pt able to stand with min assist and ambulate the entire hallway w/o rest break. Pt helped back to chair.with call button in reach.   Faustino Congress  MS, ACSM-CEP 1:14 PM 03/29/2023    Service time is from 1300 to 1312.

## 2023-03-29 NOTE — Progress Notes (Addendum)
      [address]       [place] [address]             [phone]        6 Days Post-Op Procedure(s) (LRB): CORONARY ARTERY BYPASS GRAFTING (CABG), UTILIZING BILATERAL INTERNAL MAMMARY ARTERIES AND ENDOSCOPIC VEIN HARVEST RIGHT GREATER SAPHENOUS VEIN. (N/A) TRANSESOPHAGEAL ECHOCARDIOGRAM (TEE) (N/A)  Subjective: Patient has no specific complaint this am. He is asking when he can go home.  Objective: Vital signs in last 24 hours: Temp:  [98.5 F (36.9 C)-99.6 F (37.6 C)] 98.9 F (37.2 C) (12/17 0440) Pulse Rate:  [78-91] 91 (12/17 0440) Cardiac Rhythm: Normal sinus rhythm (12/16 1904) Resp:  [18-46] 20 (12/16 2030) BP: (104-145)/(54-76) 137/65 (12/17 0440) SpO2:  [90 %-100 %] 100 % (12/17 0440) Weight:  [79.6 kg] 79.6 kg (12/17 0142)  Pre op weight 77.2 kg Current Weight  03/29/23 79.6 kg      Intake/Output from previous day: 12/16 0701 - 12/17 0700 In: 720 [P.O.:720] Out: -    Physical Exam:  Cardiovascular: RRR Pulmonary: Clear to auscultation bilaterally Abdomen: Soft, non tender, bowel sounds present. Extremities: Trace bilateral lower extremity edema. Wounds: Clean and dry.  No erythema or signs of infection. Chest tube wounds open but not draining much (sero sanguinous)  Lab Results: CBC: Recent Labs    03/27/23 0328 03/28/23 0326  WBC 14.7* 12.9*  HGB 7.8* 8.3*  HCT 22.9* 24.1*  PLT 160 218   BMET:  Recent Labs    03/28/23 0326 03/29/23 0312  NA 138 141  K 4.3 4.2  CL 113* 113*  CO2 21* 23  GLUCOSE 103* 130*  BUN 50* 37*  CREATININE 1.57* 1.52*  CALCIUM 8.2* 8.1*    PT/INR:  Lab Results  Component Value Date   INR 1.4 (H) 03/23/2023   INR 1.1 03/21/2023   ABG:  INR: Will add last result for INR, ABG once components are confirmed Will add last 4 CBG results once components are confirmed  Assessment/Plan:  1. CV - SR. On Midodrine 5 mg tid and Plavix. Will stop Midodrine. 2.  Pulmonary - On room air.  PA/LAT CXR appears to show no pneumothorax or significant pleural effusions. Encourage incentive spirometer. 3. Slightly above pre op weight-diuresis on hold because of elevated creatinine 4.  Expected post op acute blood loss anemia - Last H and H stable at 8.3 and 24.1. Continue Trigels 5. AKI on CKD-creatinine this am slightly decreased to 1.52 6. History of hypothyroidism-continue Levothyroxine 7. As discussed with Dr. Dorris Fetch, will monitor for 24 hours off Midodrine, and likely discharge in am   Donielle M ZimmermanPA-C 7:16 AM Patient seen and examined, discussed with Ms. Joycelyn Man Doing well Plan as outlined above  Salvatore Decent. Dorris Fetch, MD [facility] [phone]

## 2023-03-29 NOTE — Progress Notes (Signed)
Mobility Specialist Progress Note:   03/29/23 1050  Mobility  Activity Ambulated with assistance in hallway  Level of Assistance Standby assist, set-up cues, supervision of patient - no hands on  Assistive Device None  Distance Ambulated (ft) 470 ft  RUE Weight Bearing Per Provider Order NWB  LUE Weight Bearing Per Provider Order NWB  Activity Response Tolerated well  Mobility Referral Yes  Mobility visit 1 Mobility  Mobility Specialist Start Time (ACUTE ONLY) 0915  Mobility Specialist Stop Time (ACUTE ONLY) 0925  Mobility Specialist Time Calculation (min) (ACUTE ONLY) 10 min    Pt received in chair, agreeable to mobility. No hands on needed to stand within precautions. Asx throughout. VSS. Pt returned to chair with call bell in reach and all needs met.   Leory Plowman  Mobility Specialist Please contact via [employer] office at [phone]

## 2023-03-29 NOTE — Plan of Care (Signed)

## 2023-03-30 LAB — BASIC METABOLIC PANEL
Anion gap: 7 (ref 5–15)
BUN: 29 mg/dL — ABNORMAL HIGH (ref 8–23)
CO2: 24 mmol/L (ref 22–32)
Calcium: 8.5 mg/dL — ABNORMAL LOW (ref 8.9–10.3)
Chloride: 112 mmol/L — ABNORMAL HIGH (ref 98–111)
Creatinine, Ser: 1.47 mg/dL — ABNORMAL HIGH (ref 0.61–1.24)
GFR, Estimated: 54 mL/min — ABNORMAL LOW (ref >60)
Glucose, Bld: 104 mg/dL — ABNORMAL HIGH (ref 70–99)
Potassium: 4.5 mmol/L (ref 3.5–5.1)
Sodium: 143 mmol/L (ref 135–145)

## 2023-03-30 MED ORDER — ATORVASTATIN CALCIUM 80 MG PO TABS: 80.0000 mg | ORAL_TABLET | Freq: Every day | ORAL | 1 refills | Status: DC

## 2023-03-30 MED ORDER — TRAMADOL HCL 50 MG PO TABS: 50.0000 mg | ORAL_TABLET | Freq: Four times a day (QID) | ORAL | 0 refills | Status: AC | PRN

## 2023-03-30 MED ORDER — METOPROLOL SUCCINATE ER 25 MG PO TB24: 25.0000 mg | ORAL_TABLET | Freq: Every day | ORAL | 1 refills | Status: DC

## 2023-03-30 MED ORDER — FUROSEMIDE 40 MG PO TABS
40.0000 mg | ORAL_TABLET | Freq: Once | ORAL | Status: AC
  Administered 2023-03-30: 40 mg via ORAL
  Filled 2023-03-30: qty 1

## 2023-03-30 MED ORDER — METOPROLOL SUCCINATE ER 25 MG PO TB24
25.0000 mg | ORAL_TABLET | Freq: Every day | ORAL | Status: DC
  Administered 2023-03-30: 25 mg via ORAL
  Filled 2023-03-30: qty 1

## 2023-03-30 MED ORDER — FERROUS SULFATE 325 (65 FE) MG PO TBEC: 325.0000 mg | DELAYED_RELEASE_TABLET | Freq: Every day | ORAL | Status: AC

## 2023-03-30 MED ORDER — CLOPIDOGREL BISULFATE 75 MG PO TABS: 75.0000 mg | ORAL_TABLET | Freq: Every day | ORAL | 1 refills | Status: DC

## 2023-03-30 NOTE — Progress Notes (Addendum)
      [address]       [place] [address]             [phone]        7 Days Post-Op Procedure(s) (LRB): CORONARY ARTERY BYPASS GRAFTING (CABG), UTILIZING BILATERAL INTERNAL MAMMARY ARTERIES AND ENDOSCOPIC VEIN HARVEST RIGHT GREATER SAPHENOUS VEIN. (N/A) TRANSESOPHAGEAL ECHOCARDIOGRAM (TEE) (N/A)  Subjective: Patient wants to go home.  Objective: Vital signs in last 24 hours: Temp:  [98.9 F (37.2 C)-99.6 F (37.6 C)] 99.4 F (37.4 C) (12/18 0615) Pulse Rate:  [86-90] 89 (12/18 0604) Cardiac Rhythm: Normal sinus rhythm (12/17 2045) Resp:  [17-20] 19 (12/18 0604) BP: (127-148)/(55-64) 139/64 (12/18 0604) SpO2:  [95 %-98 %] 98 % (12/18 0604) Weight:  [79.7 kg] 79.7 kg (12/18 0604)  Pre op weight 77.2 kg Current Weight  03/30/23 79.7 kg      Intake/Output from previous day: 12/17 0701 - 12/18 0700 In: 240 [P.O.:240] Out: -    Physical Exam:  Cardiovascular: RRR Pulmonary: Clear to auscultation bilaterally Abdomen: Soft, non tender, bowel sounds present. Extremities: Trace bilateral lower extremity edema. Wounds: Clean and dry.  No erythema or signs of infection. Chest tube wounds open but not draining much (sero sanguinous)  Lab Results: CBC: Recent Labs    03/28/23 0326  WBC 12.9*  HGB 8.3*  HCT 24.1*  PLT 218   BMET:  Recent Labs    03/29/23 0312 03/30/23 0317  NA 141 143  K 4.2 4.5  CL 113* 112*  CO2 23 24  GLUCOSE 130* 104*  BUN 37* 29*  CREATININE 1.52* 1.47*  CALCIUM 8.1* 8.5*    PT/INR:  Lab Results  Component Value Date   INR 1.4 (H) 03/23/2023   INR 1.1 03/21/2023   ABG:  INR: Will add last result for INR, ABG once components are confirmed Will add last 4 CBG results once components are confirmed  Assessment/Plan:  1. CV - SR. On Plavix.As discussed with Dr. Dorris Fetch, will start low dose Toprol XL for better BP/HR control. 2.  Pulmonary - On room air.  Encourage incentive spirometer. 3. Slightly  above pre op weight-diuresis on hold because of elevated creatinine 4.  Expected post op acute blood loss anemia - Last H and H stable at 8.3 and 24.1. Continue Trigels 5. AKI on CKD-creatinine this am slightly decreased to 1.47 6. History of hypothyroidism-continue Levothyroxine 7. Discharge later this am  Donielle M ZimmermanPA-C 7:02 AM Patient seen and examined, agree with above Looks great Home today  Salvatore Decent. Dorris Fetch, MD [facility] [phone]

## 2023-03-30 NOTE — Progress Notes (Signed)
Mobility Specialist Progress Note;   03/30/23 1145  Mobility  Activity Ambulated independently in hallway  Level of Assistance Standby assist, set-up cues, supervision of patient - no hands on  Assistive Device None  Distance Ambulated (ft) 450 ft  RUE Weight Bearing Per Provider Order NWB  LUE Weight Bearing Per Provider Order NWB  Activity Response Tolerated well  Mobility Referral Yes  Mobility visit 1 Mobility  Mobility Specialist Start Time (ACUTE ONLY) 1145  Mobility Specialist Stop Time (ACUTE ONLY) 1155  Mobility Specialist Time Calculation (min) (ACUTE ONLY) 10 min   Pt agreeable to mobility. Required no physical assistance during ambulation. VSS throughout and no c/o during session. Just about to d/c. Pt returned back to room with all needs met. Wife present.   Caesar Bookman Mobility Specialist Please contact via [employer] or [employer] [phone]

## 2023-03-30 NOTE — Progress Notes (Signed)
 D/c tele and IV. Went over AVS with pt and his wife. All questions were addressed.   Lawson Radar, RN

## 2023-03-30 NOTE — Progress Notes (Signed)
Pt and family awaiting d/c.   Pt received OHS book and education on restrictions, heart healthy diet, ex guidelines, Move in the Tube sheet, incentive spirometer use when d/c and CRPII. Pt denies questions and was encouraged to look in the book for additional information. Referral placed to [facility].    Faustino Congress 03/30/2023 9:24 AM

## 2023-03-30 NOTE — TOC Transition Note (Signed)
Transition of Care [facility]) - Discharge Note Donn Pierini RN, BSN Transitions of Care Unit 4E- RN Case Manager See Treatment Team for direct phone #   Patient Details  Name: [name] MRN: [id number] Date of Birth: 1960/04/23  Transition of Care [facility]) CM/SW Contact:  Darrold Span, RN Phone Number: 03/30/2023, 12:47 PM   Clinical Narrative:    Pt stable for transition home today, wife to transport home, No HH or DME needs noted for discharge.   No further TOC needs  Final next level of care: Home/Self Care Barriers to Discharge: Barriers Resolved   Patient Goals and CMS Choice Patient states their goals for this hospitalization and ongoing recovery are:: wants to recover          Discharge Placement                 Home      Discharge Plan and Services Additional resources added to the After Visit Summary for     Discharge Planning Services: CM Consult            DME Arranged: N/A DME Agency: NA       HH Arranged: NA HH Agency: NA        Social Drivers of Health (SDOH) Interventions SDOH Screenings   Food Insecurity: No Food Insecurity (03/22/2023)  Housing: Unknown (03/24/2023)  Transportation Needs: No Transportation Needs (03/22/2023)  Utilities: Not At Risk (03/22/2023)  Tobacco Use: High Risk (03/23/2023)     Readmission Risk Interventions     No data to display

## 2023-03-31 MED FILL — Sodium Chloride IV Soln 0.9%: INTRAVENOUS | Qty: 2000 | Status: AC

## 2023-03-31 MED FILL — Heparin Sodium (Porcine) Inj 1000 Unit/ML: INTRAMUSCULAR | Qty: 30 | Status: AC

## 2023-03-31 MED FILL — Sodium Bicarbonate IV Soln 8.4%: INTRAVENOUS | Qty: 50 | Status: AC

## 2023-03-31 MED FILL — Lidocaine HCl Local Soln Prefilled Syringe 100 MG/5ML (2%): INTRAMUSCULAR | Qty: 10 | Status: AC

## 2023-03-31 MED FILL — Mannitol IV Soln 20%: INTRAVENOUS | Qty: 500 | Status: AC

## 2023-03-31 MED FILL — Electrolyte-R (PH 7.4) Solution: INTRAVENOUS | Qty: 4000 | Status: AC

## 2023-04-03 NOTE — Progress Notes (Unsigned)
Cardiology Clinic Note   Date: 04/04/2023 ID: [name], DOB March 13, 1961, MRN [id number]  Primary Cardiologist:  Debbe Odea, MD  Patient Profile    [name] is a 62 y.o. male who presents to the clinic today for hospital follow up.     Past medical history significant for: Chest pain. Coronary CTA 03/17/2023: Coronary calcium score 1175 (97th percentile).  Severe proximal LM stenosis, moderate proximal LCx/OM1 stenosis.  Mild RCA stenosis.  FFR showed significant stenosis in ostial LM, significant stenosis in mid RCA. Echo 03/17/2023: EF 60 to 65%.  No RWMA.  Grade I DD.  Normal RV size/function.  Normal PA pressure.  Mild to moderate LAE.  Mild MR.  Mild to moderate TR.  Moderate AI.  Mild aortic valve calcification without stenosis. LHC 03/21/2023: Ostial RCA 70%.  Proximal RCA 50%.  Ostial LM 75%.  OM1 50%.  Recommend transfer to [facility] for CTS consult. CABG x 3 03/23/2023: LIMA to LAD, RIMA to RCA, SVG to OM. PAD.  Carotid artery stenosis.  Stent placement right ICA 10/22/2020.  Stent placement left CCA 12/17/2020.  Carotid duplex 01/24/2023: Bilateral ICA 40-59%. Non-hemodynamically significant plaque <50% bilateral CCA.  Hyperlipidemia.  Lipid panel 02/25/2023: LDL 40, HDL 33, TG 103, total 93. Right eye amaurosis fugax.  Tobacco abuse.   In summary, patient was previously seen by [facility] cardiology over 5 years ago. He was first evaluated by Dr. Azucena Cecil on 02/25/2023 for 84-month history of chest pain exacerbated by exertion.  Pain will sometimes radiate to the left arm and neck area.  Patient underwent ischemic evaluation with coronary CTA as outlined above.      History of Present Illness    [name] is followed by Dr. Azucena Cecil for the above outlined history.  Patient was last seen in the office by me on 03/18/2023 to follow-up on abnormal coronary CTA.  He reported chest pain walking into the building that resolved with rest.  He  continued to have episodic left-sided chest pain with exertion such as walking or working.  He reported with heavier exertional activities pain would radiate up to his neck and down his left arm and resolves relatively quickly with rest.  He reported mild lightheadedness preceding chest pain.  Given LM disease and continued episodic chest pain patient was sent to the ED for unstable angina.  Patient was admitted to the hospital to await LHC which took place on 03/21/2023 and showed severe ostial LM stenosis with significant catheter dampening otherwise moderate disease.  He was transferred to [facility] where he underwent CABG x 3 on 03/23/2023.  Patient was discharged from the hospital on 03/30/2023.  Today, patient is accompanied by his wife. He reports doing well since discharge from hospital. Patient denies shortness of breath, dyspnea on exertion, orthopnea or PND. He has R>L lower extremity edema. No further chest pain, pressure, or tightness. No palpitations.  He has been experiencing some reflux/heartburn which has been a long standing problem for years. Pain is relieved with Tums. He has taken omeprazole for this in the past. His sternal incision is healing well without drainage. He has mild tenderness around the incision. His right leg is also healing well. Patient has not smoked since being admitted to the hospital.      ROS: All other systems reviewed and are otherwise negative except as noted in History of Present Illness.  Studies Reviewed    EKG Interpretation Date/Time:  Monday April 04 2023 15:11:06 EST Ventricular  Rate:  64 PR Interval:  132 QRS Duration:  78 QT Interval:  400 QTC Calculation: 412 R Axis:   0  Text Interpretation: Normal sinus rhythm Nonspecific T wave abnormality When compared with ECG of 24-Mar-2023 06:54, No signfiicant changes Confirmed by Carlos Levering [phone]) on 04/04/2023 3:25:25 PM       Physical Exam    VS:  BP (!) 122/57 (BP  Location: Left Arm, Patient Position: Sitting, Cuff Size: Normal)   Pulse 64   Ht 5\' 6"  (1.676 m)   Wt 174 lb 9.6 oz (79.2 kg)   SpO2 97%   BMI 28.18 kg/m  , BMI Body mass index is 28.18 kg/m.  GEN: Well nourished, well developed, in no acute distress. Neck: No JVD or carotid bruits. Cardiac:  RRR. No murmurs. No rubs or gallops.   Respiratory:  Respirations regular and unlabored. Clear to auscultation without rales, wheezing or rhonchi. GI: Soft, nontender, nondistended. Extremities: Radials/DP/PT 2+ and equal bilaterally. No clubbing or cyanosis. Mild R>L nonpitting lower extremity edema.   Skin: Warm and dry, no rash. Neuro: Strength intact.  Assessment & Plan   CAD Coronary CTA 03/17/2023 showed severe proximal LM stenosis, moderate proximal LCx/OM 1 stenosis, mild RCA.  FFR showed significant stenosis of ostial LM, mid RCA.  LHC 03/21/2023 showed severe ostial LM stenosis with significant catheter dampening.  He was transferred to [facility] and underwent CABG x 3 on 03/23/2023.  Patient denies further episodes of chest pain, pressure or tightness. Right wrist cath site is well healed with resolving ecchymosis and no drainage, erythema, edema or drainage. Sternal incision is well approximated without drainage. Drain sites are scabbed over without drainage. He is interested in cardiac rehab.  -Advance physical activity with short walks around home as tolerated.  -Continue aspirin, Plavix, atorvastatin, Toprol. -BMP and CBC today.    Carotid artery stenosis S/p stent placement right ICA July 2022, left CCA September 2022.  Carotid duplex October 2024 showed bilateral ICA 40 to 59%, nonhemodynamically significant plaque <50% bilateral CCA.  Patient denies dizziness, presyncope, syncope, amaurosis fugax. -Continue aspirin, atorvastatin. -Followed by vascular surgery.   Hypertension BP today 122/57.   -Continue Toprol.   Hyperlipidemia LDL November 2024 40, at goal. -Continue  atorvastatin.  Disposition: CBC and BMP today. Return in 3 months or sooner as needed.     Cardiac Rehabilitation Eligibility Assessment  The patient is ready to start cardiac rehabilitation pending clearance from the cardiac surgeon.        Signed, Etta Grandchild. Advith Martine, DNP, NP-C

## 2023-04-04 ENCOUNTER — Encounter: Payer: Self-pay | Admitting: Student

## 2023-04-04 ENCOUNTER — Ambulatory Visit: Payer: Commercial Managed Care - PPO | Attending: Student | Admitting: Student

## 2023-04-04 ENCOUNTER — Other Ambulatory Visit: Payer: Self-pay | Admitting: Student

## 2023-04-04 VITALS — BP 122/57 | HR 64 | Ht 66.0 in | Wt 174.6 lb

## 2023-04-04 DIAGNOSIS — E785 Hyperlipidemia, unspecified: Secondary | ICD-10-CM

## 2023-04-04 DIAGNOSIS — Z79899 Other long term (current) drug therapy: Secondary | ICD-10-CM | POA: Diagnosis not present

## 2023-04-04 DIAGNOSIS — Z951 Presence of aortocoronary bypass graft: Secondary | ICD-10-CM | POA: Diagnosis not present

## 2023-04-04 DIAGNOSIS — I6523 Occlusion and stenosis of bilateral carotid arteries: Secondary | ICD-10-CM

## 2023-04-04 DIAGNOSIS — I10 Essential (primary) hypertension: Secondary | ICD-10-CM

## 2023-04-04 DIAGNOSIS — I2510 Atherosclerotic heart disease of native coronary artery without angina pectoris: Secondary | ICD-10-CM | POA: Diagnosis not present

## 2023-04-04 MED ORDER — PANTOPRAZOLE SODIUM 20 MG PO TBEC: 20.0000 mg | DELAYED_RELEASE_TABLET | Freq: Every day | ORAL | 3 refills | Status: AC

## 2023-04-04 NOTE — Patient Instructions (Addendum)
Medication Instructions:  Your physician recommends the following medication changes.  START TAKING: Protonix 20 mg by mouth daily  *If you need a refill on your cardiac medications before your next appointment, please call your pharmacy*   Lab Work: Your provider would like for you to have following labs drawn today (CBC, BMP).     Testing/Procedures: No test ordered today    Follow-Up: At [facility], you and your health needs are our priority.  As part of our continuing mission to provide you with exceptional heart care, we have created designated Provider Care Teams.  These Care Teams include your primary Cardiologist (physician) and Advanced Practice Providers (APPs -  Physician Assistants and Nurse Practitioners) who all work together to provide you with the care you need, when you need it.  We recommend signing up for the patient portal called "MyChart".  Sign up information is provided on this After Visit Summary.  MyChart is used to connect with patients for Virtual Visits (Telemedicine).  Patients are able to view lab/test results, encounter notes, upcoming appointments, etc.  Non-urgent messages can be sent to your provider as well.   To learn more about what you can do with MyChart, go to [email or web address].    Your next appointment:   3 month(s)  Provider:   You may see Debbe Odea, MD or one of the following Advanced Practice Providers on your designated Care Team:   Nicolasa Ducking, NP Eula Listen, PA-C Cadence Fransico Michael, PA-C Charlsie Quest, NP Carlos Levering, NP

## 2023-04-05 ENCOUNTER — Telehealth: Payer: Self-pay | Admitting: Internal Medicine

## 2023-04-05 ENCOUNTER — Telehealth: Payer: Self-pay | Admitting: Physician Assistant

## 2023-04-05 DIAGNOSIS — E875 Hyperkalemia: Secondary | ICD-10-CM

## 2023-04-05 LAB — BASIC METABOLIC PANEL
BUN/Creatinine Ratio: 13 (ref 10–24)
BUN: 20 mg/dL (ref 8–27)
CO2: 23 mmol/L (ref 20–29)
Calcium: 10.1 mg/dL (ref 8.6–10.2)
Chloride: 101 mmol/L (ref 96–106)
Creatinine, Ser: 1.6 mg/dL — ABNORMAL HIGH (ref 0.76–1.27)
Glucose: 90 mg/dL (ref 70–99)
Potassium: 6 mmol/L (ref 3.5–5.2)
Sodium: 137 mmol/L (ref 134–144)
eGFR: 49 mL/min/{1.73_m2} — ABNORMAL LOW (ref >59)

## 2023-04-05 LAB — CBC
Hematocrit: 28.2 % — ABNORMAL LOW (ref 37.5–51.0)
Hemoglobin: 9 g/dL — ABNORMAL LOW (ref 13.0–17.7)
MCH: 31.8 pg (ref 26.6–33.0)
MCHC: 31.9 g/dL (ref 31.5–35.7)
MCV: 100 fL — ABNORMAL HIGH (ref 79–97)
Platelets: 621 10*3/uL — ABNORMAL HIGH (ref 150–450)
RBC: 2.83 x10E6/uL — ABNORMAL LOW (ref 4.14–5.80)
RDW: 12.5 % (ref 11.6–15.4)
WBC: 11.6 10*3/uL — ABNORMAL HIGH (ref 3.4–10.8)

## 2023-04-05 MED ORDER — LOKELMA 10 G PO PACK: PACK | ORAL | Status: DC

## 2023-04-05 NOTE — Telephone Encounter (Signed)
Paged by [facility] that K is 6.0. I attempted to call patient but could not reach him. Left VM and recommended he get it checked again ASAP at nearest ED given danger or arrhythmias.

## 2023-04-05 NOTE — Telephone Encounter (Signed)
Following up on critical lab of 6.0 from ON fellow. Unable to reach the patient, left VM. I will send a message to D. Wittenborn and triage to reach out to the patient.

## 2023-04-05 NOTE — Addendum Note (Signed)
Addended by: Sandi Mariscal on: 04/05/2023 08:37 AM   Modules accepted: Orders

## 2023-04-05 NOTE — Telephone Encounter (Addendum)
Samples picked up by pt's wife today 04/05/23 with instructions on the packet to take one today and one tomorrow, 12/25.

## 2023-04-05 NOTE — Telephone Encounter (Signed)
Called the patient's wife, per the dpr. She has been advised of the below. She stated that her husband cannot drive and she started back at work today. She has been advised that we close at 11 today and are closed tomorrow. She will call back if she is unable to come today but will ask a friend to come get the samples. Samples have been left at the front.    Per James Levering, NP: Please contact patient and let him know his potassium is high. I would like for him to pick up 2 samples of Lokelma 10 mg and take 1 today and 1 tomorrow to decrease his potassium. Avoid foods high in potassium such as potatoes, sweet potatoes, citrus fruits/juices, dried fruits.   He needs to come back in on Thursday for repeat BMP.  Please also let him know his hemoglobin improved from discharge and is now 9.

## 2023-04-05 NOTE — Addendum Note (Signed)
Addended by: Sandi Mariscal on: 04/05/2023 08:18 AM   Modules accepted: Orders

## 2023-04-07 ENCOUNTER — Other Ambulatory Visit: Payer: Self-pay

## 2023-04-07 ENCOUNTER — Telehealth: Payer: Self-pay

## 2023-04-07 DIAGNOSIS — Z79899 Other long term (current) drug therapy: Secondary | ICD-10-CM

## 2023-04-07 NOTE — Telephone Encounter (Signed)
STD form completed and faxed to [employer] @ [phone]  Beginning LOA 03/22/23 through 06/20/23/ DOS 03/23/23

## 2023-04-08 ENCOUNTER — Other Ambulatory Visit: Payer: Self-pay

## 2023-04-08 DIAGNOSIS — I6523 Occlusion and stenosis of bilateral carotid arteries: Secondary | ICD-10-CM

## 2023-04-08 DIAGNOSIS — I6522 Occlusion and stenosis of left carotid artery: Secondary | ICD-10-CM

## 2023-04-08 DIAGNOSIS — I10 Essential (primary) hypertension: Secondary | ICD-10-CM

## 2023-04-08 DIAGNOSIS — I200 Unstable angina: Secondary | ICD-10-CM

## 2023-04-08 LAB — BASIC METABOLIC PANEL
BUN/Creatinine Ratio: 13 (ref 10–24)
BUN: 23 mg/dL (ref 8–27)
CO2: 23 mmol/L (ref 20–29)
Calcium: 9.5 mg/dL (ref 8.6–10.2)
Chloride: 105 mmol/L (ref 96–106)
Creatinine, Ser: 1.76 mg/dL — ABNORMAL HIGH (ref 0.76–1.27)
Glucose: 98 mg/dL (ref 70–99)
Potassium: 5.5 mmol/L — ABNORMAL HIGH (ref 3.5–5.2)
Sodium: 141 mmol/L (ref 134–144)
eGFR: 43 mL/min/{1.73_m2} — ABNORMAL LOW (ref >59)

## 2023-04-08 MED ORDER — LOKELMA 10 G PO PACK: 10.0000 g | PACK | Freq: Once | ORAL | Status: AC

## 2023-04-15 LAB — CBC
Hematocrit: 30.7 % — ABNORMAL LOW (ref 37.5–51.0)
Hemoglobin: 10 g/dL — ABNORMAL LOW (ref 13.0–17.7)
MCH: 32.2 pg (ref 26.6–33.0)
MCHC: 32.6 g/dL (ref 31.5–35.7)
MCV: 99 fL — ABNORMAL HIGH (ref 79–97)
Platelets: 482 10*3/uL — ABNORMAL HIGH (ref 150–450)
RBC: 3.11 x10E6/uL — ABNORMAL LOW (ref 4.14–5.80)
RDW: 12.2 % (ref 11.6–15.4)
WBC: 8.1 10*3/uL (ref 3.4–10.8)

## 2023-04-15 LAB — BASIC METABOLIC PANEL
BUN/Creatinine Ratio: 12 (ref 10–24)
BUN: 20 mg/dL (ref 8–27)
CO2: 20 mmol/L (ref 20–29)
Calcium: 10 mg/dL (ref 8.6–10.2)
Chloride: 108 mmol/L — ABNORMAL HIGH (ref 96–106)
Creatinine, Ser: 1.63 mg/dL — ABNORMAL HIGH (ref 0.76–1.27)
Glucose: 98 mg/dL (ref 70–99)
Potassium: 5.1 mmol/L (ref 3.5–5.2)
Sodium: 143 mmol/L (ref 134–144)
eGFR: 48 mL/min/{1.73_m2} — ABNORMAL LOW (ref >59)

## 2023-04-20 NOTE — Progress Notes (Signed)
 [address]       [name] [address]             [phone]     HPI: [name] is a 63 year old male with a past medical history of hypertension, hyperlipidemia, CAD, PAD, carotid artery stenosis (s/p carotid stenting), TIA, and tobacco abuse. The patient returns for routine postoperative follow-up having undergone CABG x 3 by Dr. Kerrin on 12/11. The patient's early postoperative recovery while in the hospital was notable for poor pulmonary effort requiring bipap and aggressive pulmonary toilet, hypotension requiring midodrine , and AKI on likely CKD. He was discharged in stable condition on 12/18. He saw cardiology on 12/23 and was noted to be doing well. He was noted to have an elevated potassium level that was treated with Lokelma . Since hospital discharge the patient reports soreness especially with cough, this is relieved with Tylenol . He does admit to a cough productive with clear sputum but denies SOB. He admits he is ambulating around his house but has not been walking much outside due to the cold. He denies lower extremity swelling and dizziness.   Current Outpatient Medications  Medication Sig Dispense Refill   acetaminophen  (TYLENOL ) 500 MG tablet Take 1,000 mg by mouth every 6 (six) hours as needed for mild pain.     aspirin  EC 81 MG EC tablet Take 1 tablet (81 mg total) by mouth at bedtime. Swallow whole. 30 tablet 11   atorvastatin  (LIPITOR) 80 MG tablet Take 1 tablet (80 mg total) by mouth daily. 30 tablet 1   clopidogrel  (PLAVIX ) 75 MG tablet Take 1 tablet (75 mg total) by mouth daily. 30 tablet 1   ferrous sulfate  325 (65 FE) MG EC tablet Take 1 tablet (325 mg total) by mouth daily with breakfast. For one month then stop. If develops constipation, may take laxative or stop iron (Patient not taking: Reported on 04/04/2023)     levothyroxine  (SYNTHROID ) 125 MCG tablet Take 1 tablet (125 mcg total) by mouth daily at 6 (six) AM.     metoprolol   succinate (TOPROL -XL) 25 MG 24 hr tablet Take 1 tablet (25 mg total) by mouth daily. 30 tablet 1   pantoprazole  (PROTONIX ) 20 MG tablet Take 1 tablet (20 mg total) by mouth daily. 90 tablet 3   sodium zirconium cyclosilicate  (LOKELMA ) 10 g PACK packet Take one pack     traMADol  (ULTRAM ) 50 MG tablet Take 1 tablet (50 mg total) by mouth every 6 (six) hours as needed for moderate pain (pain score 4-6). 30 tablet 0   No current facility-administered medications for this visit.   Vitals: Today's Vitals   04/26/23 1221  BP: (!) 179/75  Pulse: 81  Resp: 20  SpO2: 99%  Weight: 175 lb 3.2 oz (79.5 kg)  Height: 5' 6 (1.676 m)   Body mass index is 28.28 kg/m.  Physical Exam: General: Alert and oriented, no acute distress Neuro: Grossly intact CV: Regular rate and rhythm, no murmur Pulm: Clear to auscultation bilaterally GI: +BS, nontender, no distension Extremities: No edema Wound: Sternum and EVH site are healing well without sign of infection  Diagnostic Tests: CLINICAL DATA:  Status post CABG.   EXAM: CHEST - 2 VIEW   COMPARISON:  Chest radiograph dated 03/29/2023.   FINDINGS: Small bilateral pleural effusions. No focal consolidation or pneumothorax. The cardiac silhouette is within normal limits. Median sternotomy wires and CABG vascular clips. No acute osseous pathology.   IMPRESSION: Small bilateral  pleural effusions.     Electronically Signed   By: Vanetta Chou M.D.   On: 04/26/2023 12:36  Impression/Plan S/P CABG: The patient is progressing well from surgery. He admits to some sternal soreness that is relieved with Tylenol . CXR shows stable bilateral small pleural effusions. The patient denies shortness of breath, fever and chills but does admit to a cough productive with clear sputum. This should improve with time, we discussed increasing ambulation. He does not have any further lower extremity swelling and is at his preoperative weight. His diet is improving  with time. He is hypertensive in the clinic today with a systolic blood pressure in the 170s checked at the beginning and end of the appointment. I will increase his Toprol  XL from 25mg  daily to 50mg  daily. He was told to record his blood pressure since he has a blood pressure cuff at home and bring it to his next cardiology appointment. I will not make any other medication changes at this time. He follows up with cardiology in March. We reviewed continued sternal precautions and no heavy lifting until 3 months postop. I cleared him to drive and for cardiac rehab today but he feels he probably will not participate although it was encouraged. 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 Plan to have the patient return to the clinic as needed.   James JAYSON Helm, PA-C [facility] [phone]

## 2023-04-25 ENCOUNTER — Other Ambulatory Visit: Payer: Self-pay | Admitting: Thoracic Surgery (Cardiothoracic Vascular Surgery)

## 2023-04-25 DIAGNOSIS — Z951 Presence of aortocoronary bypass graft: Secondary | ICD-10-CM

## 2023-04-26 ENCOUNTER — Ambulatory Visit (INDEPENDENT_AMBULATORY_CARE_PROVIDER_SITE_OTHER): Payer: Commercial Managed Care - PPO | Admitting: Physician Assistant

## 2023-04-26 ENCOUNTER — Ambulatory Visit: Payer: Commercial Managed Care - PPO | Admitting: Student

## 2023-04-26 ENCOUNTER — Ambulatory Visit
Admission: RE | Admit: 2023-04-26 | Discharge: 2023-04-26 | Disposition: A | Discharge status: Home / Self Care | Payer: Commercial Managed Care - PPO | Source: Ambulatory Visit | Attending: Thoracic Surgery (Cardiothoracic Vascular Surgery) | Admitting: Thoracic Surgery (Cardiothoracic Vascular Surgery)

## 2023-04-26 VITALS — BP 179/75 | HR 81 | Resp 20 | Ht 66.0 in | Wt 175.2 lb

## 2023-04-26 DIAGNOSIS — Z951 Presence of aortocoronary bypass graft: Secondary | ICD-10-CM

## 2023-04-26 MED ORDER — METOPROLOL SUCCINATE ER 50 MG PO TB24: 50.0000 mg | ORAL_TABLET | Freq: Every day | ORAL | 1 refills | Status: DC

## 2023-04-26 NOTE — Patient Instructions (Addendum)
 You may return to driving an automobile as long as you are no longer requiring oral narcotic pain relievers during the daytime.  It would be wise to start driving only short distances during the daylight and gradually increase from there as you feel comfortable.  You are encouraged to enroll and participate in the outpatient cardiac rehab program beginning as soon as practical.  Continue to avoid any heavy lifting or strenuous use of your arms or shoulders for at least a total of three months from the time of surgery.  After three months you may gradually increase how much you lift or otherwise use your arms or chest as tolerated, with limits based upon whether or not activities lead to the return of significant discomfort.  If SBP<105 or HR<60 please hold Lopressor . Please bring BP record to next cardiology office. Please call us  or cardiology if your systolic BP remains less than 894 or greater than 140.

## 2023-05-03 ENCOUNTER — Telehealth: Payer: Self-pay | Admitting: Emergency Medicine

## 2023-05-03 NOTE — Telephone Encounter (Signed)
Pt called and second MSG left

## 2023-05-03 NOTE — Telephone Encounter (Signed)
-----   Message from [name] sent at 05/03/2023  7:41 AM EST ----- Leary Roca!  Will you please check in with this patient and see what his BP has been doing in the last week with increased Metoprolol? I may need to make some medication changes but will need to know what his BP is doing first.   Thank you!  DW

## 2023-05-03 NOTE — Telephone Encounter (Signed)
Pt called and message left on voice-mail.

## 2023-05-03 NOTE — Telephone Encounter (Signed)
-----   Message from Carlos Levering sent at 05/03/2023  7:41 AM EST ----- Leary Roca!  Will you please check in with this patient and see what his BP has been doing in the last week with increased Metoprolol? I may need to make some medication changes but will need to know what his BP is doing first.   Thank you!  DW

## 2023-05-05 NOTE — Telephone Encounter (Signed)
Pt called and message left for BP check-up

## 2023-05-05 NOTE — Telephone Encounter (Signed)
-----   Message from [name] sent at 05/03/2023  7:41 AM EST ----- Leary Roca!  Will you please check in with this patient and see what his BP has been doing in the last week with increased Metoprolol? I may need to make some medication changes but will need to know what his BP is doing first.   Thank you!  DW

## 2023-06-07 ENCOUNTER — Telehealth: Payer: Self-pay | Admitting: Student

## 2023-06-07 ENCOUNTER — Other Ambulatory Visit: Payer: Self-pay | Admitting: Student

## 2023-06-07 NOTE — Telephone Encounter (Signed)
 Called and spoke with patient. Informed him of the following from Carlos Levering, NP.  Clearance to return to work will be provided by CT surgery. However, will you please find out how the patient's BP has been doing?   Thank you!   DW  Patient states that he does not have any recent blood pressures. Patient asked to monitor blood pressures and send a log in. Patient verbalizes understanding and states that he will start monitoring today and send via MyChart.

## 2023-06-07 NOTE — Telephone Encounter (Signed)
-----   Message from Carlos Levering sent at 05/03/2023  7:41 AM EST ----- Leary Roca!  Will you please check in with this patient and see what his BP has been doing in the last week with increased Metoprolol? I may need to make some medication changes but will need to know what his BP is doing first.   Thank you!  DW

## 2023-06-07 NOTE — Telephone Encounter (Signed)
 Pt's wife is requesting a callback regarding pt needing a letter to return back to work on 3/10. Please advise

## 2023-06-07 NOTE — Telephone Encounter (Signed)
*  STAT* If patient is at the pharmacy, call can be transferred to refill team.   1. Which medications need to be refilled? (please list name of each medication and dose if known)   metoprolol succinate (TOPROL XL) 50 MG 24 hr tablet  clopidogrel (PLAVIX) 75 MG tablet   2. Which pharmacy/location (including street and city if local pharmacy) is medication to be sent to?  [facility] - [place], [place] - [address]      3. Do they need a 30 day or 90 day supply? 90 day    Pt is out of medication

## 2023-06-08 ENCOUNTER — Encounter: Payer: Self-pay | Admitting: *Deleted

## 2023-06-08 ENCOUNTER — Other Ambulatory Visit: Payer: Self-pay | Admitting: Emergency Medicine

## 2023-06-08 MED ORDER — CLOPIDOGREL BISULFATE 75 MG PO TABS: 75.0000 mg | ORAL_TABLET | Freq: Every day | ORAL | 3 refills | Status: AC

## 2023-06-08 MED ORDER — METOPROLOL SUCCINATE ER 50 MG PO TB24: 50.0000 mg | ORAL_TABLET | Freq: Every day | ORAL | 1 refills | Status: DC

## 2023-07-04 ENCOUNTER — Ambulatory Visit: Payer: Commercial Managed Care - PPO | Admitting: Student

## 2023-07-21 ENCOUNTER — Telehealth: Payer: Self-pay | Admitting: Cardiology

## 2023-07-21 MED ORDER — ATORVASTATIN CALCIUM 80 MG PO TABS: 80.0000 mg | ORAL_TABLET | Freq: Every day | ORAL | 0 refills | Status: DC

## 2023-07-21 NOTE — Telephone Encounter (Signed)
*  STAT* If patient is at the pharmacy, call can be transferred to refill team.   1. Which medications need to be refilled? (please list name of each medication and dose if known)   atorvastatin (LIPITOR) 80 MG tablet   2. Would you like to learn more about the convenience, safety, & potential cost savings by using the [facility]?   3. Are you open to using the [facility] (Type [facility]. ).  4. Which pharmacy/location (including street and city if local pharmacy) is medication to be sent to?  [facility] - [place], [place] - [address]   5. Do they need a 30 day or 90 day supply?   Wife stated patient is completely out of this medication. Patient has appointment scheduled on 4/28 with D. Wittenborn.

## 2023-08-06 NOTE — Progress Notes (Unsigned)
 Cardiology Clinic Note   Date: 08/08/2023 ID: [name], DOB 1961-01-31, MRN [id number]  Primary Cardiologist:  Constancia Delton, MD  Chief Complaint   [name] is a 63 y.o. male who presents to the clinic today for routine follow up.   Patient Profile   [name] is followed by Dr. Junnie Olives for the history outlined below.      Past medical history significant for: Chest pain. Coronary CTA 03/17/2023: Coronary calcium  score 1175 (97th percentile).  Severe proximal LM stenosis, moderate proximal LCx/OM1 stenosis.  Mild RCA stenosis.  FFR showed significant stenosis in ostial LM, significant stenosis in mid RCA. Echo 03/17/2023: EF 60 to 65%.  No RWMA.  Grade I DD.  Normal RV size/function.  Normal PA pressure.  Mild to moderate LAE.  Mild MR.  Mild to moderate TR.  Moderate AI.  Mild aortic valve calcification without stenosis. LHC 03/21/2023: Ostial RCA 70%.  Proximal RCA 50%.  Ostial LM 75%.  OM1 50%.  Recommend transfer to [facility] for CTS consult. CABG x 3 03/23/2023: LIMA to LAD, RIMA to RCA, SVG to OM. PAD.  Carotid artery stenosis.  Stent placement right ICA 10/22/2020.  Stent placement left CCA 12/17/2020.  Carotid duplex 01/24/2023: Bilateral ICA 40-59%. Non-hemodynamically significant plaque <50% bilateral CCA.  Hyperlipidemia.  Lipid panel 02/25/2023: LDL 40, HDL 33, TG 103, total 93. Right eye amaurosis fugax.  Tobacco abuse.   In summary, patient was previously seen by [facility] cardiology over 5 years ago. He was first evaluated by Dr. Junnie Olives on 02/25/2023 for 41-month history of chest pain exacerbated by exertion.  Pain will sometimes radiate to the left arm and neck area.  Patient underwent ischemic evaluation with coronary CTA as outlined above.    Patient was seen in the office in December 2024 to follow-up on abnormal coronary CTA.  He reported chest pain walking into the building that resolved with rest.  He continued to have  episodic left-sided chest pain with exertion such as walking or working.  He reported with heavier exertional activities pain would radiate up to his neck and down his left arm and resolves relatively quickly with rest.  He reported mild lightheadedness preceding chest pain.  Given LM disease and continued episodic chest pain patient was sent to the ED for unstable angina.   Patient was admitted to the hospital to await LHC which took place on 03/21/2023 and showed severe ostial LM stenosis with significant catheter dampening otherwise moderate disease.  He was transferred to [facility] where he underwent CABG x 3 on 03/23/2023.  Patient was discharged from the hospital on 03/30/2023.  Patient was last seen in the office by me on 04/04/2023 for follow-up after CABG.  He was doing well at that time with mild incisional tenderness.  He reported experiencing reflux/heartburn symptoms that have been a chronic issue for him relieved with Tums.  He was started on Protonix  for reflux symptoms.     History of Present Illness    Today, patient is accompanied by his wife. He reports continued left sided chest soreness worsened when he moves his arms after having the crossed over his chest for an extended period of time or when he is stretching out his arms. This discomfort is different from the chest pain he was having prior to CABG. He also reports continued shortness of breath that can occur with and without exertion. He has quit smoking quickly since his hospital admission in December. He has never  seen a pulmonologist. He continues with reflux/heartburn. He had 2 "really bad" episodes that caused him to vomit. He has seen ENT/GI in the past. He is going to speak to his PCP about possibly being referred again. His biggest complaint today is fatigue. 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 He does not do any other exercise or activity outside of this. His  wife is concerned that he could be a little depressed. He admits that he did not ease back into work activities when released from CT surgery. His BP is consistently elevated often >150/90. He denies headaches or dizziness.     ROS: All other systems reviewed and are otherwise negative except as noted in History of Present Illness.  EKGs/Labs Reviewed    EKG Interpretation Date/Time:  Monday August 08 2023 15:32:50 EDT Ventricular Rate:  91 PR Interval:  142 QRS Duration:  82 QT Interval:  356 QTC Calculation: 437 R Axis:   -2  Text Interpretation: Normal sinus rhythm Cannot rule out Anterior infarct , age undetermined When compared with ECG of 04-Apr-2023 15:11, Nonspecific T wave abnormality no longer evident in Inferior leads Nonspecific T wave abnormality, improved in Anterolateral leads Confirmed by Morey Ar [phone]) on 08/08/2023 3:41:49 PM   04/14/2023: BUN 20; Creatinine, Ser 1.63; Potassium 5.1; Sodium 143   04/14/2023: Hemoglobin 10.0; WBC 8.1   03/18/2023: TSH 0.249    Risk Assessment/Calculations      HYPERTENSION CONTROL Vitals:   08/08/23 1525 08/08/23 1659  BP: (!) 161/83 (!) 160/90    The patient's blood pressure is elevated above target today.  In order to address the patient's elevated BP: A new medication was prescribed today.           Physical Exam    VS:  BP (!) 160/90 (BP Location: Left Arm, Patient Position: Sitting, Cuff Size: Normal)   Pulse 91   Ht 5\' 6"  (1.676 m)   Wt 194 lb (88 kg)   SpO2 95%   BMI 31.31 kg/m  , BMI Body mass index is 31.31 kg/m.  GEN: Well nourished, well developed, in no acute distress. Neck: No JVD or carotid bruits. Cardiac:  RRR. No murmurs. No rubs or gallops.   Respiratory:  Respirations regular and unlabored. Clear to auscultation without rales, wheezing or rhonchi. GI: Soft, nontender, nondistended. Extremities: Radials/DP/PT 2+ and equal bilaterally. No clubbing or cyanosis. No edema.  Skin: Warm and  dry, no rash. Neuro: Strength intact.  Assessment & Plan   CAD Coronary CTA 03/17/2023 showed severe proximal LM stenosis, moderate proximal LCx/OM 1 stenosis, mild RCA.  FFR showed significant stenosis of ostial LM, mid RCA.  LHC 03/21/2023 showed severe ostial LM stenosis with significant catheter dampening.  S/p CABG x 3 on 03/23/2023.  Patient denies anginal chest pain. He does report incisional soreness particularly if he moves his arms after having them crossed over his chest for an extended period of time or when he stretches out his arms.  -Continue aspirin , Plavix , atorvastatin , Toprol . -BMP and CBC today.   Shortness of breath Patient reports shortness of breath with and without exertion. This has been a longstanding issue for him. He has never seen a pulmonologist. Lung sounds are diminished bilaterally.  -Refer to pulmonology.    Carotid artery stenosis S/p stent placement right ICA July 2022, left CCA September 2022.  Carotid duplex October 2024 showed bilateral ICA 40 to 59%, nonhemodynamically significant plaque <50% bilateral  CCA.  Patient denies dizziness, presyncope, syncope, amaurosis fugax. -Continue Plavix , aspirin , atorvastatin . -Followed by vascular surgery.   Hypertension BP today 161/83 on intake and 160/90 on my recheck. No report of headaches or dizziness. He does admit to dietary indiscretion with sodium eating a lot of crackers and chips.  -Encouraged to read food labels and limit sodium intake.  -Stop Toprol . Start Carvedilol 12.5 mg bid. He is instructed to MyChart 1 week BP log.   -Consider addition of amlodipine  if not at goal.    Hyperlipidemia LDL November 2024 40, at goal. -Continue atorvastatin .  Tobacco abuse Patient has not smoked since hospital admission in December. He has gained some weight since then and admits to eating more in place of not smoking. Discussed strategies to work on weight loss. He is congratulated on not smoking.  -Encouraged to  continue smoking cessation.   GERD Patient reports 2 episodes of severe reflux that caused him to vomit. He uses Tums for breakthrough reflux. He has been evaluated by ENT/GI in the past.  -Follow up with PCP for possible referral for further evaluation.  -Consider keeping a food diary to determine possible triggers.  -Continue Protonix  and Tums.   Disposition: CBC and BMP today.  Stop Toprol . Start carvedilol 12.5 mg bid. CBC and BMP today. Refer to pulmonology. Return in 3 months or sooner as needed.    Signed, Lonell Rives. Izaiyah Kleinman, DNP, NP-C

## 2023-08-08 ENCOUNTER — Encounter: Payer: Self-pay | Admitting: Student

## 2023-08-08 ENCOUNTER — Ambulatory Visit: Attending: Student | Admitting: Student

## 2023-08-08 VITALS — BP 160/90 | HR 91 | Ht 66.0 in | Wt 194.0 lb

## 2023-08-08 DIAGNOSIS — I6523 Occlusion and stenosis of bilateral carotid arteries: Secondary | ICD-10-CM

## 2023-08-08 DIAGNOSIS — R0602 Shortness of breath: Secondary | ICD-10-CM | POA: Diagnosis not present

## 2023-08-08 DIAGNOSIS — F17201 Nicotine dependence, unspecified, in remission: Secondary | ICD-10-CM

## 2023-08-08 DIAGNOSIS — Z95828 Presence of other vascular implants and grafts: Secondary | ICD-10-CM

## 2023-08-08 DIAGNOSIS — Z79899 Other long term (current) drug therapy: Secondary | ICD-10-CM

## 2023-08-08 DIAGNOSIS — I10 Essential (primary) hypertension: Secondary | ICD-10-CM

## 2023-08-08 DIAGNOSIS — I25810 Atherosclerosis of coronary artery bypass graft(s) without angina pectoris: Secondary | ICD-10-CM

## 2023-08-08 DIAGNOSIS — Z9889 Other specified postprocedural states: Secondary | ICD-10-CM

## 2023-08-08 DIAGNOSIS — K219 Gastro-esophageal reflux disease without esophagitis: Secondary | ICD-10-CM

## 2023-08-08 MED ORDER — CARVEDILOL 12.5 MG PO TABS: 12.5000 mg | ORAL_TABLET | Freq: Two times a day (BID) | ORAL | 3 refills | Status: AC

## 2023-08-08 NOTE — Patient Instructions (Signed)
 Medication Instructions:  Your physician has recommended you make the following change in your medication:   STOP Toprol  START Carvedilol 12.5 mg twice a day  *If you need a refill on your cardiac medications before your next appointment, please call your pharmacy*  Lab Work: BMP & CBC today  If you have labs (blood work) drawn today and your tests are completely normal, you will receive your results only by: MyChart Message (if you have MyChart) OR A paper copy in the mail If you have any lab test that is abnormal or we need to change your treatment, we will call you to review the results.  Testing/Procedures: None  Follow-Up: At [facility], you and your health needs are our priority.  As part of our continuing mission to provide you with exceptional heart care, our providers are all part of one team.  This team includes your primary Cardiologist (physician) and Advanced Practice Providers or APPs (Physician Assistants and Nurse Practitioners) who all work together to provide you with the care you need, when you need it.  Your next appointment:   3 month(s)  Provider:   Constancia Delton, MD or Morey Ar, NP    Monitor BP for 1 week and send readings through My Chart.   Please monitor blood pressures and keep a log of your readings.   Make sure to check 2 hours after your medications.   AVOID these things for 30 minutes before checking your blood pressure: No Drinking caffeine. No Drinking alcohol. No Eating. No Smoking. No Exercising.  Five minutes before checking your blood pressure: Pee. Sit in a dining chair. Avoid sitting in a soft couch or armchair. Be quiet. Do not talk.   REFERRAL placed for you to see pulmonary. Their number is [phone]

## 2023-08-11 LAB — CBC
Hematocrit: 39.4 % (ref 37.5–51.0)
Hemoglobin: 12.7 g/dL — ABNORMAL LOW (ref 13.0–17.7)
MCH: 28.9 pg (ref 26.6–33.0)
MCHC: 32.2 g/dL (ref 31.5–35.7)
MCV: 90 fL (ref 79–97)
Platelets: 342 10*3/uL (ref 150–450)
RBC: 4.39 x10E6/uL (ref 4.14–5.80)
RDW: 14.4 % (ref 11.6–15.4)
WBC: 9.2 10*3/uL (ref 3.4–10.8)

## 2023-08-11 LAB — BASIC METABOLIC PANEL WITH GFR
BUN/Creatinine Ratio: 16 (ref 10–24)
BUN: 26 mg/dL (ref 8–27)
CO2: 19 mmol/L — ABNORMAL LOW (ref 20–29)
Calcium: 9.6 mg/dL (ref 8.6–10.2)
Chloride: 105 mmol/L (ref 96–106)
Creatinine, Ser: 1.67 mg/dL — ABNORMAL HIGH (ref 0.76–1.27)
Glucose: 80 mg/dL (ref 70–99)
Potassium: 4.5 mmol/L (ref 3.5–5.2)
Sodium: 142 mmol/L (ref 134–144)
eGFR: 46 mL/min/{1.73_m2} — ABNORMAL LOW (ref >59)

## 2023-08-24 ENCOUNTER — Other Ambulatory Visit: Payer: Self-pay | Admitting: Student

## 2023-08-30 ENCOUNTER — Encounter (INDEPENDENT_AMBULATORY_CARE_PROVIDER_SITE_OTHER): Payer: Self-pay

## 2023-11-14 NOTE — Progress Notes (Unsigned)
 Cardiology Clinic Note   Date: 11/17/2023 ID: Darnel Mchan, DOB 21-Apr-1960, MRN [id number]  Primary Cardiologist:  Redell Cave, MD  Chief Complaint   [name] is a 63 y.o. male who presents to the clinic today for routine follow up.   Patient Profile   Fue Cervenka is followed by Dr. Cave for the history outlined below.      Past medical history significant for: Chest pain. Coronary CTA 03/17/2023: Coronary calcium  score 1175 (97th percentile).  Severe proximal LM stenosis, moderate proximal LCx/OM1 stenosis.  Mild RCA stenosis.  FFR showed significant stenosis in ostial LM, significant stenosis in mid RCA. Echo 03/17/2023: EF 60 to 65%.  No RWMA.  Grade I DD.  Normal RV size/function.  Normal PA pressure.  Mild to moderate LAE.  Mild MR.  Mild to moderate TR.  Moderate AI.  Mild aortic valve calcification without stenosis. LHC 03/21/2023: Ostial RCA 70%.  Proximal RCA 50%.  Ostial LM 75%.  OM1 50%.  Recommend transfer to [facility] for CTS consult. CABG x 3 03/23/2023: LIMA to LAD, RIMA to RCA, SVG to OM. PAD.  Carotid artery stenosis.  Stent placement right ICA 10/22/2020.  Stent placement left CCA 12/17/2020.  Carotid duplex 01/24/2023: Bilateral ICA 40-59%. Non-hemodynamically significant plaque <50% bilateral CCA.  Hyperlipidemia.  Lipid panel 11/11/2023: LDL 39, HDL 39, TG 109, total 98. Right eye amaurosis fugax.  Tobacco abuse.   In summary, patient was previously seen by [facility] cardiology over 5 years ago. He was first evaluated by Dr. Cave on 02/25/2023 for 13-month history of chest pain exacerbated by exertion.  Pain will sometimes radiate to the left arm and neck area.  Patient underwent ischemic evaluation with coronary CTA as outlined above.    Patient was seen in the office in December 2024 to follow-up on abnormal coronary CTA.  He reported chest pain walking into the building that resolved with rest.  He continued to have episodic  left-sided chest pain with exertion such as walking or working.  He reported with heavier exertional activities pain would radiate up to his neck and down his left arm and resolves relatively quickly with rest.  He reported mild lightheadedness preceding chest pain.  Given LM disease and continued episodic chest pain patient was sent to the ED for unstable angina.   Patient was admitted to the hospital to await LHC which took place on 03/21/2023 and showed severe ostial LM stenosis with significant catheter dampening otherwise moderate disease.  He was transferred to [facility] where he underwent CABG x 3 on 03/23/2023.  Patient was discharged from the hospital on 03/30/2023.  Patient was seen in the office on 04/04/2023 for follow-up after CABG.  He was doing well at that time with mild incisional tenderness.  He reported experiencing reflux/heartburn symptoms that have been a chronic issue for him relieved with Tums.  He was started on Protonix  for reflux symptoms.   Patient was last seen in the office by me on 08/08/2023 for routine follow-up.  He complained of fatigue.  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  He was not doing any other excessive activity outside of his work.  His wife was concerned he could be depressed.  He admitted to not easing back into work activities when released from CT surgery.  BP at home consistently elevated with readings often > 150/90.  Toprol  was stopped and he was started on carvedilol  12.5 mg twice daily.  He was  referred to pulmonology for longstanding dyspnea.     History of Present Illness    Today, patient is here alone. He reports he is doing well overall. He continues to have dyspnea with and without exertion. He has not yet made an appointment with pulmonology. He denies chest pain, pressure or tightness. No orthopnea. He has lower extremity edema that is at its best in the morning and progresses throughout the day. He has bad reflux and  will have times when it wakes him at night causing him to sit straight up in bed gasping. He had a particularly bad episode that caused him to vomit. He is taking Protonix  daily with Tums for breakthrough. He has not seen GI for this. He has not tried to track foods to determine triggers. He continues to not smoke. He is frustrated that he has gained >25 lb since bypass. He states he is overeating. PCP started amlodipine  for elevated BP. He forgot to take his dose this morning.     ROS: All other systems reviewed and are otherwise negative except as noted in History of Present Illness.  EKGs/Labs Reviewed    EKG Interpretation Date/Time:  Thursday November 17 2023 15:25:39 EDT Ventricular Rate:  65 PR Interval:  138 QRS Duration:  90 QT Interval:  396 QTC Calculation: 411 R Axis:   -4  Text Interpretation: Normal sinus rhythm Normal ECG When compared with ECG of 08-Aug-2023 15:32, No significant change was found Confirmed by Loistine Sober [phone]) on 11/17/2023 3:45:03 PM    03/18/2023: TSH 0.249  Labs from outside facility 11/11/2023: WBC 6.2, hemoglobin 12.8, hematocrit 39.7, sodium 143, potassium 4.8, creatinine 176, BUN 24.  Risk Assessment/Calculations      HYPERTENSION CONTROL Vitals:   11/17/23 1521 11/17/23 1528  BP: (!) 142/86 (!) 140/80    The patient's blood pressure is elevated above target today.  In order to address the patient's elevated BP: Blood pressure will be monitored at home to determine if medication changes need to be made. Patient missed dose of amlodipine .          Physical Exam    VS:  BP (!) 140/80 (BP Location: Right Arm, Patient Position: Sitting, Cuff Size: Normal)   Pulse 65   Ht 5' 6 (1.676 m)   Wt 202 lb 6.4 oz (91.8 kg)   SpO2 97%   BMI 32.67 kg/m  , BMI Body mass index is 32.67 kg/m.  GEN: Well nourished, well developed, in no acute distress. Neck: No JVD or carotid bruits. Cardiac:  RRR.  No murmur. No rubs or gallops.    Respiratory:  Respirations regular and unlabored. Clear to auscultation without rales, wheezing or rhonchi. GI: Soft, nontender, nondistended. Extremities: Radials/DP/PT 2+ and equal bilaterally. No clubbing or cyanosis. Mild nonpitting lower extremity edema bilaterally.   Skin: Warm and dry, no rash. Neuro: Strength intact.  Assessment & Plan   CAD Coronary CTA 03/17/2023 showed severe proximal LM stenosis, moderate proximal LCx/OM 1 stenosis, mild RCA.  FFR showed significant stenosis of ostial LM, mid RCA.  LHC 03/21/2023 showed severe ostial LM stenosis with significant catheter dampening.  S/p CABG x 3 on 03/23/2023.  Patient denies chest pain, pressure or tightness.  -Continue aspirin , Plavix , atorvastatin , Toprol .   Shortness of breath Patient reports shortness of breath with and without exertion. This has been a longstanding issue for him. He has never seen a pulmonologist. Lung sounds are diminished bilaterally.  He has not yet contacted pulmonology to make appointment. -  Encouraged calling pulmonology for appointment.   Carotid artery stenosis S/p stent placement right ICA July 2022, left CCA September 2022.  Carotid duplex October 2024 showed bilateral ICA 40 to 59%, nonhemodynamically significant plaque <50% bilateral CCA.  Patient denies dizziness, presyncope, syncope, amaurosis fugax. -Continue Plavix , aspirin , atorvastatin . -Followed by vascular surgery.   Hypertension BP today 140/80 on intake and 140/82 on my recheck.  Patient reports he missed dose of amlodipine .  Will not make any changes secondary to missed dose of amlodipine . -Continue carvedilol , amlodipine .   Hyperlipidemia LDL 30 19 November 2023, at goal. -Continue atorvastatin .   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  He is concerned as he has gained > 25 pounds since bypass.  He admits to overeating.  Discussed strategies to limit portions. -  Encouraged continued smoking cessation. - Encouraged weight loss.   GERD Patient reports 2 episodes of severe reflux that caused him to vomit. He uses Tums for breakthrough reflux. He has been evaluated by ENT/GI in the past but it has been many years.  Offered referral to GI but patient declines at this time. -Follow up with PCP for possible referral for further evaluation.  -Consider keeping a food diary to determine possible triggers.  -Continue Protonix  and Tums.   Disposition: Return in 6 months or sooner as needed.         Signed, Barnie HERO. Arta Stump, DNP, NP-C

## 2023-11-17 ENCOUNTER — Ambulatory Visit: Attending: Student | Admitting: Student

## 2023-11-17 ENCOUNTER — Encounter: Payer: Self-pay | Admitting: Student

## 2023-11-17 VITALS — BP 140/80 | HR 65 | Ht 66.0 in | Wt 202.4 lb

## 2023-11-17 DIAGNOSIS — I25810 Atherosclerosis of coronary artery bypass graft(s) without angina pectoris: Secondary | ICD-10-CM

## 2023-11-17 DIAGNOSIS — K219 Gastro-esophageal reflux disease without esophagitis: Secondary | ICD-10-CM

## 2023-11-17 DIAGNOSIS — I6523 Occlusion and stenosis of bilateral carotid arteries: Secondary | ICD-10-CM | POA: Diagnosis not present

## 2023-11-17 DIAGNOSIS — E785 Hyperlipidemia, unspecified: Secondary | ICD-10-CM

## 2023-11-17 DIAGNOSIS — I10 Essential (primary) hypertension: Secondary | ICD-10-CM

## 2023-11-17 DIAGNOSIS — R0602 Shortness of breath: Secondary | ICD-10-CM | POA: Diagnosis not present

## 2023-11-17 DIAGNOSIS — R635 Abnormal weight gain: Secondary | ICD-10-CM

## 2023-11-17 DIAGNOSIS — F17201 Nicotine dependence, unspecified, in remission: Secondary | ICD-10-CM

## 2023-11-17 NOTE — Patient Instructions (Signed)
 Medication Instructions:   Your physician recommends that you continue on your current medications as directed. Please refer to the Current Medication list given to you today.   *If you need a refill on your cardiac medications before your next appointment, please call your pharmacy*  Lab Work:  No labs ordered today   If you have labs (blood work) drawn today and your tests are completely normal, you will receive your results only by: MyChart Message (if you have MyChart) OR A paper copy in the mail If you have any lab test that is abnormal or we need to change your treatment, we will call you to review the results.  Testing/Procedures:  No test ordered today   Follow-Up: At [facility], you and your health needs are our priority.  As part of our continuing mission to provide you with exceptional heart care, our providers are all part of one team.  This team includes your primary Cardiologist (physician) and Advanced Practice Providers or APPs (Physician Assistants and Nurse Practitioners) who all work together to provide you with the care you need, when you need it.  Your next appointment:    6 month(s)  Provider:    You may see Redell Cave, MD or one of the following Advanced Practice Providers on your designated Care Team:   Barnie Hila, NP

## 2024-01-23 ENCOUNTER — Ambulatory Visit (INDEPENDENT_AMBULATORY_CARE_PROVIDER_SITE_OTHER): Payer: Commercial Managed Care - PPO | Admitting: Vascular Surgery

## 2024-01-23 ENCOUNTER — Encounter (INDEPENDENT_AMBULATORY_CARE_PROVIDER_SITE_OTHER): Payer: Commercial Managed Care - PPO

## 2024-03-28 ENCOUNTER — Ambulatory Visit: Admitting: Pulmonary Disease

## 2024-03-28 ENCOUNTER — Encounter: Payer: Self-pay | Admitting: Emergency Medicine

## 2024-03-28 ENCOUNTER — Encounter: Payer: Self-pay | Admitting: Pulmonary Disease

## 2024-03-28 VITALS — BP 158/88 | HR 83 | Temp 98.7°F | Ht 66.0 in | Wt 219.2 lb

## 2024-03-28 DIAGNOSIS — R0600 Dyspnea, unspecified: Secondary | ICD-10-CM

## 2024-03-28 DIAGNOSIS — R079 Chest pain, unspecified: Secondary | ICD-10-CM | POA: Diagnosis not present

## 2024-03-28 DIAGNOSIS — R062 Wheezing: Secondary | ICD-10-CM | POA: Diagnosis not present

## 2024-03-28 DIAGNOSIS — Z87891 Personal history of nicotine dependence: Secondary | ICD-10-CM

## 2024-03-28 DIAGNOSIS — R0602 Shortness of breath: Secondary | ICD-10-CM

## 2024-03-28 MED ORDER — FLUTICASONE-SALMETEROL 250-50 MCG/ACT IN AEPB: 1.0000 | INHALATION_SPRAY | Freq: Two times a day (BID) | RESPIRATORY_TRACT | 3 refills | Status: AC

## 2024-03-28 MED ORDER — ALBUTEROL SULFATE HFA 108 (90 BASE) MCG/ACT IN AERS: 2.0000 | INHALATION_SPRAY | Freq: Four times a day (QID) | RESPIRATORY_TRACT | 2 refills | Status: AC | PRN

## 2024-03-28 NOTE — Progress Notes (Signed)
 Synopsis: Referred in by Loistine Sober, NP   Subjective:   PATIENT ID: [name] GENDER: male DOB: [birth year], MRN: [id number]  Chief Complaint  Patient presents with   Shortness of Breath    Had triple bypass a year ago. SOB since surgery. Occasional wheezing. No cough. Was told he had the beginnings of COPD a long time ado.     HPI Discussed the use of AI scribe software for clinical note transcription with the patient, who gave verbal consent to proceed.  History of Present Illness   Loyd Marhefka is a 63 year old male with a history of open heart surgery who presents with shortness of breath.  He has been experiencing shortness of breath for almost a year, which began after undergoing open heart surgery. The shortness of breath occurs even while sitting and sometimes necessitates taking a deep breath. No cough or chest tightness is present, but there is occasional chest pain, particularly when sitting up, and some wheezing, though not consistently.  He has a past medical history of being told he might have the beginnings of COPD, but he has not been using any inhalers recently. He previously used an Albuterol  inhaler, which helped with coughing when lying down. He quit smoking about a year ago, coinciding with his heart surgery, after having smoked three packs a day since the age of [age].  He reports a family history of a brother who has emphysema and COPD and is a smoker.        Family History  Problem Relation Age of Onset   Hypothyroidism Mother    Diabetes Mother    Heart attack Mother    Cancer Father    Heart disease Sister    Hypothyroidism Sister    Heart disease Brother    Hypothyroidism Brother      Social History   Socioeconomic History   Marital status: Married    Spouse name: Not on file   Number of children: Not on file   Years of education: Not on file   Highest education level: Not on file  Occupational History   Not on file   Tobacco Use   Smoking status: Former    Current packs/day: 0.00    Average packs/day: 1 pack/day for 47.0 years (47.0 ttl pk-yrs)    Types: Cigarettes    Quit date: 03/18/2023    Years since quitting: 1.0   Smokeless tobacco: Never   Tobacco comments:    Quit smoking in 2024    Started smoking at 63 years old    Smoked  3PPD at his heaviest.   Vaping Use   Vaping status: Never Used  Substance and Sexual Activity   Alcohol use: Never   Drug use: Never   Sexual activity: Not on file  Other Topics Concern   Not on file  Social History Narrative   Not on file   Social Drivers of Health   Tobacco Use: Medium Risk (03/28/2024)   Patient History    Smoking Tobacco Use: Former    Smokeless Tobacco Use: Never    Passive Exposure: Not on Actuary Strain: Not on file  Food Insecurity: No Food Insecurity (03/22/2023)   Hunger Vital Sign    Worried About Running Out of Food in the Last Year: Never true    Ran Out of Food in the Last Year: Never true  Transportation Needs: No Transportation Needs (03/22/2023)   PRAPARE - Transportation    Lack of  Transportation (Medical): No    Lack of Transportation (Non-Medical): No  Physical Activity: Not on file  Stress: Not on file  Social Connections: Not on file  Intimate Partner Violence: Not At Risk (03/22/2023)   Humiliation, Afraid, Rape, and Kick questionnaire    Fear of Current or Ex-Partner: No    Emotionally Abused: No    Physically Abused: No    Sexually Abused: No  Depression (PHQ2-9): Not on file  Alcohol Screen: Not on file  Housing: Unknown (03/24/2023)   Housing Stability Vital Sign    Unable to Pay for Housing in the Last Year: No    Number of Times Moved in the Last Year: Not on file    Homeless in the Last Year: Not on file  Utilities: Not At Risk (03/22/2023)   AHC Utilities    Threatened with loss of utilities: No  Health Literacy: Not on file        Objective:   Vitals:   03/28/24 1536   BP: (!) 160/90  Pulse: 83  Temp: 98.7 F (37.1 C)  SpO2: 99%  Weight: 219 lb 3.2 oz (99.4 kg)  Height: 5' 6 (1.676 m)   99% on RA BMI Readings from Last 3 Encounters:  03/28/24 35.38 kg/m  11/17/23 32.67 kg/m  08/08/23 31.31 kg/m   Wt Readings from Last 3 Encounters:  03/28/24 219 lb 3.2 oz (99.4 kg)  11/17/23 202 lb 6.4 oz (91.8 kg)  08/08/23 194 lb (88 kg)    Physical Exam GEN: NAD, Healthy Appearing HEENT: Supple Neck, Reactive Pupils, EOMI  CVS: Normal S1, Normal S2, RRR, No murmurs or ES appreciated  Lungs: Clear bilateral air entry.  Abdomen: Soft, non tender, non distended, + BS  Extremities: Warm and well perfused, No edema   Labs and imaging were reviewed.  Ancillary Information   CBC    Component Value Date/Time   WBC 9.2 08/08/2023 1627   WBC 12.9 (H) 03/28/2023 0326   RBC 4.39 08/08/2023 1627   RBC 2.51 (L) 03/28/2023 0326   HGB 12.7 (L) 08/08/2023 1627   HCT 39.4 08/08/2023 1627   PLT 342 08/08/2023 1627   MCV 90 08/08/2023 1627   MCH 28.9 08/08/2023 1627   MCH 33.1 03/28/2023 0326   MCHC 32.2 08/08/2023 1627   MCHC 34.4 03/28/2023 0326   RDW 14.4 08/08/2023 1627   LYMPHSABS 2.8 03/23/2023 0249   MONOABS 0.8 03/23/2023 0249   EOSABS 0.8 (H) 03/23/2023 0249   BASOSABS 0.1 03/23/2023 0249        No data to display           Assessment & Plan:      #Chronic obstructive pulmonary disease COPD likely due to smoking history. Symptoms include dyspnea, wheezing, and chest pain. Smoking cessation one year ago. - Ordered pulmonary function test to evaluate lung function. - Prescribed Advair inhaler, one puff BID. - Prescribed albuterol  inhaler PRN.  #Lung cancer screening in former smoker Eligible for lung cancer screening due to significant smoking history. - Enrolled in lung cancer screening program with annual CT scan.     Return in about 4 months (around 07/27/2024).  I personally spent a total of 60 minutes in the care of the  patient today including preparing to see the patient, getting/reviewing separately obtained history, performing a medically appropriate exam/evaluation, counseling and educating, placing orders, documenting clinical information in the EHR, independently interpreting results, and communicating results.   Darrin Barn, MD [facility] 03/28/2024  3:51 PM

## 2024-04-23 ENCOUNTER — Ambulatory Visit

## 2024-04-23 DIAGNOSIS — R0602 Shortness of breath: Secondary | ICD-10-CM | POA: Diagnosis not present

## 2024-04-23 LAB — PULMONARY FUNCTION TEST
DL/VA % pred: 93 %
DL/VA: 3.94 ml/min/mmHg/L
DLCO unc % pred: 74 %
DLCO unc: 17.86 ml/min/mmHg
FEF 25-75 Post: 2.1 L/s
FEF 25-75 Pre: 1.91 L/s
FEF2575-%Change-Post: 10 %
FEF2575-%Pred-Post: 83 %
FEF2575-%Pred-Pre: 75 %
FEV1-%Change-Post: 2 %
FEV1-%Pred-Post: 78 %
FEV1-%Pred-Pre: 77 %
FEV1-Post: 2.41 L
FEV1-Pre: 2.35 L
FEV1FVC-%Change-Post: 0 %
FEV1FVC-%Pred-Pre: 102 %
FEV6-%Change-Post: 2 %
FEV6-%Pred-Post: 80 %
FEV6-%Pred-Pre: 79 %
FEV6-Post: 3.11 L
FEV6-Pre: 3.04 L
FEV6FVC-%Change-Post: 0 %
FEV6FVC-%Pred-Post: 105 %
FEV6FVC-%Pred-Pre: 105 %
FVC-%Change-Post: 2 %
FVC-%Pred-Post: 76 %
FVC-%Pred-Pre: 75 %
FVC-Post: 3.11 L
FVC-Pre: 3.04 L
Post FEV1/FVC ratio: 77 %
Post FEV6/FVC ratio: 100 %
Pre FEV1/FVC ratio: 77 %
Pre FEV6/FVC Ratio: 100 %
RV % pred: 115 %
RV: 2.39 L
TLC % pred: 89 %
TLC: 5.57 L

## 2024-04-23 NOTE — Patient Instructions (Signed)
 Full PFT completed today ? ?

## 2024-04-23 NOTE — Progress Notes (Signed)
 Full PFT completed today ? ?

## 2024-05-14 ENCOUNTER — Telehealth: Payer: Self-pay

## 2024-05-14 DIAGNOSIS — Z122 Encounter for screening for malignant neoplasm of respiratory organs: Secondary | ICD-10-CM

## 2024-05-14 DIAGNOSIS — Z87891 Personal history of nicotine dependence: Secondary | ICD-10-CM

## 2024-05-14 NOTE — Telephone Encounter (Signed)
 Lung Cancer Screening Narrative/Criteria Questionnaire (Cigarette Smokers Only- No Cigars/Pipes/vapes)   [name]   SDMV:05/18/2024 at 10:00 am Natalie         Jan 03, 1961               LDCT: 05/23/2024 at 4:15 pm [facility]    64 y.o.   Phone: [phone]  Lung Screening Narrative (confirm age 12-77 yrs Medicare / 50-80 yrs Private pay insurance)   Insurance information: [employer]   Referring Provider: Rollene, NP   This screening involves an initial phone call with a team member from our program. It is called a shared decision making visit. The initial meeting is required by  insurance and Medicare to make sure you understand the program. This appointment takes about 15-20 minutes to complete. You will complete the screening scan at your scheduled date/time.  This scan takes about 5-10 minutes to complete. You can eat and drink normally before and after the scan.  Criteria questions for Lung Cancer Screening:   Are you a current or former smoker? Former Age began smoking: 14   If you are a former smoker, what year did you quit smoking? Quit 03/18/2023 (within 15 yrs)   To calculate your smoking history, I need an accurate estimate of how many packs of cigarettes you smoked per day and for how many years. (Not just the number of PPD you are now smoking)   Years smoking 48 x Packs per day 3 = Pack years 144   (at least 20 pack yrs)   (Make sure they understand that we need to know how much they have smoked in the past, not just the number of PPD they are smoking now)  Do you have a personal history of cancer?  No    Do you have a family history of cancer? No  Are you coughing up blood?  No  Have you had unexplained weight loss of 15 lbs or more in the last 6 months? No  It looks like you meet all criteria.  When would be a good time for us  to schedule you for this screening?   Additional information: N/A

## 2024-05-18 ENCOUNTER — Ambulatory Visit

## 2024-05-18 ENCOUNTER — Encounter: Payer: Self-pay | Admitting: *Deleted

## 2024-05-18 DIAGNOSIS — Z87891 Personal history of nicotine dependence: Secondary | ICD-10-CM

## 2024-05-18 NOTE — Patient Instructions (Signed)

## 2024-05-18 NOTE — Progress Notes (Cosign Needed)
 Virtual Visit via Telephone Note  I connected with [name] on 05/18/24 at 10:00 AM EST by telephone and verified that I am speaking with the correct person using two identifiers.  Location: Patient: at home  Provider: [address] [place], [place], [address]    I discussed the limitations, risks, security and privacy concerns of performing an evaluation and management service by telephone and the availability of in person appointments. I also discussed with the patient that there may be a patient responsible charge related to this service. The patient expressed understanding and agreed to proceed.  Shared Decision Making Visit Lung Cancer Screening Program [phone])   Eligibility: Age 64 y.o. Pack Years Smoking History Calculation 144 (# packs/per year x # years smoked) Recent History of coughing up blood  no Unexplained weight loss? no ( >Than 15 pounds within the last 6 months ) Prior History Lung / other cancer no (Diagnosis within the last 5 years already requiring surveillance chest CT Scans). Smoking Status Former Smoker Former Smokers: Years since quit: 1 year  Quit Date: 03/18/23  Visit Components: Discussion included one or more decision making aids. yes Discussion included risk/benefits of screening. yes Discussion included potential follow up diagnostic testing for abnormal scans. yes Discussion included meaning and risk of over diagnosis. yes Discussion included meaning and risk of False Positives. yes Discussion included meaning of total radiation exposure. yes  Counseling Included: Importance of adherence to annual lung cancer LDCT screening. yes Impact of comorbidities on ability to participate in the program. yes Ability and willingness to under diagnostic treatment. yes  Smoking Cessation Counseling: Current Smokers:  Discussed importance of smoking cessation. yes Information about tobacco cessation classes and interventions provided to  patient. yes Patient provided with ticket for LDCT Scan. no Symptomatic Patient. no  Counseling(Intermediate counseling: > three minutes) 99406 Diagnosis Code: Tobacco Use Z72.0 Asymptomatic Patient yes  Counseling (Intermediate counseling: > three minutes counseling) [id number] Former Smokers:  Discussed the importance of maintaining cigarette abstinence. yes Diagnosis Code: Personal History of Nicotine  Dependence. S12.108 Information about tobacco cessation classes and interventions provided to patient. Yes Patient provided with ticket for LDCT Scan. no Written Order for Lung Cancer Screening with LDCT placed in Epic. Yes (CT Chest Lung Cancer Screening Low Dose W/O CM) [id number] Z12.2-Screening of respiratory organs Z87.891-Personal history of nicotine  dependence   Laneta Speaks, RN

## 2024-05-23 ENCOUNTER — Ambulatory Visit

## 2024-06-04 ENCOUNTER — Ambulatory Visit: Admitting: Cardiology

## 2024-07-30 ENCOUNTER — Ambulatory Visit: Admitting: Pulmonary Disease
